# Patient Record
Sex: Female | Born: 1940
Health system: Southern US, Community
[De-identification: ages and names within clinical notes are randomized; demographics above are authoritative.]

## PROBLEM LIST (undated history)

## (undated) DIAGNOSIS — I639 Cerebral infarction, unspecified: Secondary | ICD-10-CM

## (undated) DIAGNOSIS — I2510 Atherosclerotic heart disease of native coronary artery without angina pectoris: Secondary | ICD-10-CM

## (undated) DIAGNOSIS — R42 Dizziness and giddiness: Secondary | ICD-10-CM

## (undated) HISTORY — PX: RHINOPLASTY: SUR1284

## (undated) HISTORY — PX: CARDIAC CATHETERIZATION: SHX172

---

## 2010-01-21 DIAGNOSIS — B029 Zoster without complications: Secondary | ICD-10-CM

## 2010-01-21 HISTORY — DX: Zoster without complications: B02.9

## 2010-08-13 ENCOUNTER — Encounter: Payer: Self-pay | Admitting: Emergency Medicine

## 2010-08-13 ENCOUNTER — Inpatient Hospital Stay (INDEPENDENT_AMBULATORY_CARE_PROVIDER_SITE_OTHER)
Admission: RE | Admit: 2010-08-13 | Discharge: 2010-08-13 | Disposition: A | Discharge status: Home / Self Care | Payer: Medicare Other | Source: Ambulatory Visit | Attending: Emergency Medicine | Admitting: Emergency Medicine

## 2010-08-13 DIAGNOSIS — B029 Zoster without complications: Secondary | ICD-10-CM

## 2010-08-13 HISTORY — DX: Zoster without complications: B02.9

## 2010-09-03 ENCOUNTER — Inpatient Hospital Stay (INDEPENDENT_AMBULATORY_CARE_PROVIDER_SITE_OTHER)
Admission: RE | Admit: 2010-09-03 | Discharge: 2010-09-03 | Disposition: A | Discharge status: Home / Self Care | Payer: Medicare Other | Source: Ambulatory Visit | Attending: Family Medicine | Admitting: Family Medicine

## 2010-09-03 ENCOUNTER — Encounter: Payer: Self-pay | Admitting: Family Medicine

## 2010-09-03 DIAGNOSIS — L03319 Cellulitis of trunk, unspecified: Secondary | ICD-10-CM | POA: Insufficient documentation

## 2010-09-03 DIAGNOSIS — L02219 Cutaneous abscess of trunk, unspecified: Secondary | ICD-10-CM | POA: Insufficient documentation

## 2010-09-03 DIAGNOSIS — B0229 Other postherpetic nervous system involvement: Secondary | ICD-10-CM

## 2010-09-05 ENCOUNTER — Telehealth (INDEPENDENT_AMBULATORY_CARE_PROVIDER_SITE_OTHER): Payer: Self-pay | Admitting: *Deleted

## 2010-12-24 NOTE — Progress Notes (Signed)
Summary: Severe Pain from Shingles (room 4)   Vital Signs:  Patient Profile:   70 Years Old Female CC:      severe pain from Shingles Height:     64 inches Weight:      192 pounds O2 Sat:      96 % O2 treatment:    Oxygen Temp:     98.3 degrees F oral Pulse rate:   93 / minute Resp:     16 per minute BP sitting:   162 / 92  (left arm) Cuff size:   large  Pt. in pain?   yes  Vitals Entered By: Lavell Islam RN (September 03, 2010 12:24 PM)                   Prior Medication List:  Joyce Copa ALLERGY 180 MG TABS (FEXOFENADINE HCL)  VICODIN 5-500 MG TABS (HYDROCODONE-ACETAMINOPHEN) 1 by mouth at bedtime as needed for severe pain VALTREX 1 GM TABS (VALACYCLOVIR HCL) 1 by mouth three times a day for 7 days PREDNISONE 10 MG TABS (PREDNISONE) 20mg  two times a day for 3 days, 10mg  two times a day for 3 days, 10mg  daily for 3 days, then stop   Updated Prior Medication List: ALLEGRA ALLERGY 180 MG TABS (FEXOFENADINE HCL)  VICODIN 5-500 MG TABS (HYDROCODONE-ACETAMINOPHEN) 1 by mouth at bedtime as needed for severe pain VALTREX 1 GM TABS (VALACYCLOVIR HCL) 1 by mouth three times a day for 7 days PREDNISONE 10 MG TABS (PREDNISONE) 20mg  two times a day for 3 days, 10mg  two times a day for 3 days, 10mg  daily for 3 days, then stop  Current Allergies (reviewed today): ! * MANGOSHistory of Present Illness Chief Complaint: severe pain from Shingles History of Present Illness: Patient reports having sever r hip pain . She reports having shingles about 3 weeks ago. The same pain initial for 3 days the the rash. She was placed on antiviral and was doing fine until Thursday when the pain started back.  The pain has been constant then and getting more intense. She reports taking the vicodin 1/2 tablet to a hole tablet at night trying to get rest unsuccessfully. Some of the ras under her abdomen has remained open as well.  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)   Current Meds ALLEGRA ALLERGY 180 MG TABS (FEXOFENADINE HCL)  VICODIN 5-500 MG TABS (HYDROCODONE-ACETAMINOPHEN) 1 by mouth at bedtime as needed for severe pain VALTREX 1 GM TABS (VALACYCLOVIR HCL) 1 by mouth three times a day for 7 days PREDNISONE 10 MG TABS (PREDNISONE) 20mg  two times a day for 3 days, 10mg  two times a day for 3 days, 10mg  daily for 3 days, then stop BACTROBAN 2 % OINT (MUPIROCIN) apply to open rash 3 x aday for up to 10 days NEURONTIN 300 MG CAPS (GABAPENTIN) 1 by mouth q at night for 3 days and Increase to twice ad ay after 3 nights PERCOCET 5-325 MG TABS (OXYCODONE-ACETAMINOPHEN) 1 by mouth q 6hrs as needed for pain LIDODERM 5 % PTCH (LIDOCAINE) trim and apply over lL lumbar spine  REVIEW OF SYSTEMS Constitutional Symptoms      Denies fever, chills, night sweats, weight loss, weight gain, and fatigue.  Eyes       Denies change in vision, eye pain, eye discharge, glasses, contact lenses, and eye surgery. Ear/Nose/Throat/Mouth       Denies hearing loss/aids, change in hearing, ear pain, ear discharge, dizziness, frequent runny nose, frequent  nose bleeds, sinus problems, sore throat, hoarseness, and tooth pain or bleeding.  Respiratory       Denies dry cough, productive cough, wheezing, shortness of breath, asthma, bronchitis, and emphysema/COPD.  Cardiovascular       Denies murmurs, chest pain, and tires easily with exhertion.    Gastrointestinal       Denies stomach pain, nausea/vomiting, diarrhea, constipation, blood in bowel movements, and indigestion. Genitourniary       Denies painful urination, kidney stones, and loss of urinary control. Neurological       Denies paralysis, seizures, and fainting/blackouts. Musculoskeletal       Denies muscle pain, joint pain, joint stiffness, decreased range of motion, redness, swelling, muscle weakness, and gout.  Skin       Denies bruising, unusual mles/lumps or sores, and  hair/skin or nail changes.  Psych       Denies mood changes, temper/anger issues, anxiety/stress, speech problems, depression, and sleep problems. Other Comments: Severe pain Shingles sites along left side body   Past History:  Family History: Last updated: 08/13/2010 mother- healthy age 9yo father0 deceased age 75 yo- lung CA and heart dz  Social History: Last updated: 08/13/2010 Never Smoked Alcohol use-no Drug use-no  Risk Factors: Smoking Status: never (08/13/2010)  Past Medical History: vertigo Dx Shingles 07/2310  Past Surgical History: Reviewed history from 08/13/2010 and no changes required. rhinoplasty - 1976  Family History: Reviewed history from 08/13/2010 and no changes required. mother- healthy age 88yo father0 deceased age 40 yo- lung CA and heart dz  Social History: Reviewed history from 08/13/2010 and no changes required. 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  Plan New Medications/Changes: PERCOCET 5-325 MG TABS (OXYCODONE-ACETAMINOPHEN) 1 by mouth q 6hrs as needed for pain  #20 x 0, 09/03/2010, Hassan Rowan MD NEURONTIN 300 MG CAPS (GABAPENTIN) 1 by mouth q at night for 3 days and Increase to twice ad ay after 3 nights  #30 x 0, 09/03/2010, Hassan Rowan MD BACTROBAN 2 % OINT (MUPIROCIN) apply to open rash 3 x aday for up to 10 days  #1 tube x 0, 09/03/2010, Hassan Rowan MD LIDODERM 5 % PTCH (LIDOCAINE) trim and apply over lL  lumbar spine  #1 pack x 2, 09/03/2010, Hassan Rowan MD PERCOCET 5-325 MG TABS (OXYCODONE-ACETAMINOPHEN) 1 by mouth q 6hrs as needed for pain  #20 x 0, 09/03/2010, Hassan Rowan MD NEURONTIN 300 MG CAPS (GABAPENTIN) 1 by mouth q at night for 3 days and Increase to twice ad ay after 3 nights  #30 x 0, 09/03/2010, Hassan Rowan MD BACTROBAN 2 % OINT (MUPIROCIN) apply to open rash 3 x aday for up to 10 days  #1 tube x 0, 09/03/2010, Hassan Rowan MD  New Orders: Est. Patient Level III [16109] Follow Up: Follow up on an as needed basis, Follow up with Primary Physician Follow Up: 7-10 days if not bettr  The patient and/or caregiver has been counseled thoroughly with regard to medications prescribed including dosage, schedule, interactions, rationale for use, and possible side effects and they verbalize understanding.  Diagnoses  and expected course of recovery discussed and will return if not improved as expected or if the condition worsens. Patient and/or caregiver verbalized understanding.  Prescriptions: PERCOCET 5-325 MG TABS (OXYCODONE-ACETAMINOPHEN) 1 by mouth q 6hrs as needed for pain  #20 x 0   Entered and Authorized by:   Hassan Rowan MD   Signed by:   Hassan Rowan MD on 09/03/2010   Method used:   Print then Give to Patient   RxID:   [id number] NEURONTIN 300 MG CAPS (GABAPENTIN) 1 by mouth q at night for 3 days and Increase to twice ad ay after 3 nights  #30 x 0   Entered and Authorized by:   Hassan Rowan MD   Signed by:   Hassan Rowan MD on 09/03/2010   Method used:   Print then Give to Patient   RxID:   [id number] BACTROBAN 2 % OINT (MUPIROCIN) apply to open rash 3 x aday for up to 10 days  #1 tube x 0   Entered and Authorized by:   Hassan Rowan MD   Signed by:   Hassan Rowan MD on 09/03/2010   Method used:   Print then Give to Patient   RxID:   [id number] LIDODERM 5 % PTCH (LIDOCAINE) trim and apply over lL lumbar spine  #1 pack x 2   Entered and Authorized by:   Hassan Rowan  MD   Signed by:   Hassan Rowan MD on 09/03/2010   Method used:   Print then Give to Patient   RxID:   [id number] PERCOCET 5-325 MG TABS (OXYCODONE-ACETAMINOPHEN) 1 by mouth q 6hrs as needed for pain  #20 x 0   Entered and Authorized by:   Hassan Rowan MD   Signed by:   Hassan Rowan MD on 09/03/2010   Method used:   Print then Give to Patient   RxID:   [id number] NEURONTIN 300 MG CAPS (GABAPENTIN) 1 by mouth q at night for 3 days and Increase to twice ad ay after 3 nights  #30 x 0   Entered and Authorized by:   Hassan Rowan MD   Signed by:   Hassan Rowan MD on 09/03/2010   Method used:   Print then Give to Patient   RxID:   [id number] BACTROBAN 2 % OINT (MUPIROCIN) apply to open rash 3 x aday for up to 10 days  #1 tube x 0   Entered and Authorized by:   Hassan Rowan MD   Signed by:   Hassan Rowan MD on 09/03/2010   Method used:   Print then Give to Patient   RxID:   [id number]   Patient Instructions: 1)  Please schedule a follow-up appointment as needed. 2)  Please schedule an appointment with your primary doctor in :7-86m days 3)  Try to estavblish a PCP if able.  Orders Added: 1)  Est. Patient Level III [31517]

## 2010-12-24 NOTE — Progress Notes (Signed)
Summary: BACK SPASMS rm 5   Vital Signs:  Patient Profile:   70 Years Old Female CC:      back spasms x 2days Height:     64 inches Weight:      193.25 pounds O2 Sat:      97 % O2 treatment:    Room Air Temp:     98.8 degrees F oral Pulse rate:   87 / minute Resp:     18 per minute BP sitting:   172 / 89  (left arm) Cuff size:   regular  Vitals Entered By: Clemens Catholic LPN (August 13, 2010 2:06 PM)                  Updated Prior Medication List: ALLEGRA ALLERGY 180 MG TABS (FEXOFENADINE HCL)   Current Allergies: ! * MANGOSHistory of Present Illness History from: patient Chief Complaint: back spasms x 2days History of Present Illness: The patient presents today with back pain for 2 days.  Feels like spasms.  She has baseline back pain ever since she was young.  Today she broke out in a rash around the same area.  Feels tight and burning and she is having trouble sleeping.  [place]. Trauma: no Bladder/bowel incontinence: no Weakness: no Fever/chills: no Night pain: no Unexplained weight loss: no Cancer/immunosuppression: no PMH of osteoporosis or chronic steroid use:  no  REVIEW OF SYSTEMS Constitutional Symptoms      Denies fever, chills, night sweats, weight loss, weight gain, and fatigue.  Eyes       Denies change in vision, eye pain, eye discharge, glasses, contact lenses, and eye surgery. Ear/Nose/Throat/Mouth       Denies hearing loss/aids, change in hearing, ear pain, ear discharge, dizziness, frequent runny nose, frequent nose bleeds, sinus problems, sore throat, hoarseness, and tooth pain or bleeding.  Respiratory       Denies dry cough, productive cough, wheezing, shortness of breath, asthma, bronchitis, and emphysema/COPD.  Cardiovascular       Denies murmurs, chest pain, and tires easily with exhertion.    Gastrointestinal       Denies stomach pain, nausea/vomiting, diarrhea, constipation, blood in bowel movements, and  indigestion. Genitourniary       Denies painful urination, kidney stones, and loss of urinary control. Neurological       Denies paralysis, seizures, and fainting/blackouts. Musculoskeletal       Complains of muscle pain and joint pain.      Denies joint stiffness, decreased range of motion, redness, swelling, muscle weakness, and gout.  Skin       Denies bruising, unusual mles/lumps or sores, and hair/skin or nail changes.  Psych       Denies mood changes, temper/anger issues, anxiety/stress, speech problems, depression, and sleep problems. Other Comments: pt c/o low/mid back spasms radiating to her LT leg x 2 days. she noticed a rash on her LT hip/back area x today. no fever.   Past History:  Past Medical History: Unremarkable  Past Surgical History: rhinoplasty - 61  Family History: mother- healthy age 56yo father0 deceased age 6 yo- lung CA and heart dz  Social History: Never Smoked Alcohol use-no Drug use-no Smoking Status:  never Drug Use:  no Physical Exam General appearance: well developed, well nourished, no acute distress MSE: oriented to time, place, and person Mild Vesicular rash L1 dermatome Left side.  Tender to the touch.  No signs of infection/cellulitis. Assessment New Problems: HERPES ZOSTER (ICD-053.9)  Patient Education: Patient and/or caregiver instructed in the following: rest, fluids.  Plan New Medications/Changes: PREDNISONE 10 MG TABS (PREDNISONE) 20mg  two times a day for 3 days, 10mg  two times a day for 3 days, 10mg  daily for 3 days, then stop  #QS x 0, 08/13/2010, Hoyt Koch MD VALTREX 1 GM TABS (VALACYCLOVIR HCL) 1 by mouth three times a day for 7 days  #21 x 0, 08/13/2010, Hoyt Koch MD VICODIN 5-500 MG TABS (HYDROCODONE-ACETAMINOPHEN) 1 by mouth at bedtime as needed for severe pain  #10 x 0, 08/13/2010, Hoyt Koch MD  New Orders: New Patient Level III [phone] Planning Comments:   Rx with Prednisone taper,  Valtrex and Lortab for nighttime pain.  Avoid pregnant women and babies.  Keep area clean.  Follow-up with your primary care physician if not improving or if getting worse   The patient and/or caregiver has been counseled thoroughly with regard to medications prescribed including dosage, schedule, interactions, rationale for use, and possible side effects and they verbalize understanding.  Diagnoses and expected course of recovery discussed and will return if not improved as expected or if the condition worsens. Patient and/or caregiver verbalized understanding.  Prescriptions: PREDNISONE 10 MG TABS (PREDNISONE) 20mg  two times a day for 3 days, 10mg  two times a day for 3 days, 10mg  daily for 3 days, then stop  #QS x 0   Entered and Authorized by:   Hoyt Koch MD   Signed by:   Hoyt Koch MD on 08/13/2010   Method used:   Print then Give to Patient   RxID:   [id number] VALTREX 1 GM TABS (VALACYCLOVIR HCL) 1 by mouth three times a day for 7 days  #21 x 0   Entered and Authorized by:   Hoyt Koch MD   Signed by:   Hoyt Koch MD on 08/13/2010   Method used:   Print then Give to Patient   RxID:   [id number] VICODIN 5-500 MG TABS (HYDROCODONE-ACETAMINOPHEN) 1 by mouth at bedtime as needed for severe pain  #10 x 0   Entered and Authorized by:   Hoyt Koch MD   Signed by:   Hoyt Koch MD on 08/13/2010   Method used:   Print then Give to Patient   RxID:   [id number]   Orders Added: 1)  New Patient Level III [25366]

## 2010-12-24 NOTE — Telephone Encounter (Signed)
  Phone Note Outgoing Call Call back at [facility] Phone [phone]   Call placed by: Lajean Saver RN,  September 05, 2010 12:09 PM Call placed to: Patient Action Taken: Phone Call Completed Summary of Call: Callback: patient reports some improvement in her pain. Will call with further questions

## 2012-10-09 ENCOUNTER — Encounter (HOSPITAL_BASED_OUTPATIENT_CLINIC_OR_DEPARTMENT_OTHER): Payer: Self-pay | Admitting: *Deleted

## 2012-10-09 ENCOUNTER — Emergency Department (HOSPITAL_BASED_OUTPATIENT_CLINIC_OR_DEPARTMENT_OTHER)
Admission: EM | Admit: 2012-10-09 | Discharge: 2012-10-10 | Disposition: A | Discharge status: Home / Self Care | Payer: Medicare Other | Attending: Emergency Medicine | Admitting: Emergency Medicine

## 2012-10-09 ENCOUNTER — Emergency Department (HOSPITAL_BASED_OUTPATIENT_CLINIC_OR_DEPARTMENT_OTHER): Payer: Medicare Other

## 2012-10-09 DIAGNOSIS — M7989 Other specified soft tissue disorders: Secondary | ICD-10-CM

## 2012-10-09 NOTE — ED Notes (Signed)
Patient transported to Ultrasound 

## 2012-10-09 NOTE — ED Notes (Signed)
Pt c/o right leg swelling x 1 day denies pain

## 2012-10-10 ENCOUNTER — Encounter (HOSPITAL_BASED_OUTPATIENT_CLINIC_OR_DEPARTMENT_OTHER): Payer: Self-pay | Admitting: Emergency Medicine

## 2012-10-10 NOTE — ED Provider Notes (Signed)
CSN: [id number]     Arrival date & time 10/09/12  2340 History   First MD Initiated Contact with Patient 10/10/12 0008     Chief Complaint  Patient presents with  . Leg Swelling   (Consider location/radiation/quality/duration/timing/severity/associated sxs/prior Treatment) The history is provided by the patient. No language interpreter was used.  RLE swelling this evening.  No trauma.  No pain.  No weakness. No travel.  No f/c/r.  No rashes. No SP no sob  History reviewed. No pertinent past medical history. History reviewed. No pertinent past surgical history. History reviewed. No pertinent family history. History  Substance Use Topics  . Smoking status: Never Smoker   . Smokeless tobacco: Not on file  . Alcohol Use: No   OB History   Grav Para Term Preterm Abortions TAB SAB Ect Mult Living                 Review of Systems  Constitutional: Negative for fever.  HENT: Negative for neck pain.   Respiratory: Negative for shortness of breath and wheezing.   Cardiovascular: Negative for chest pain.  Skin: Negative for rash.  All other systems reviewed and are negative.    Allergies  Review of patient's allergies indicates no known allergies.  Home Medications  No current outpatient prescriptions on file. BP 185/79  Pulse 94  Temp(Src) 98.1 F (36.7 C) (Oral)  Resp 16  Ht 5\' 4"  (1.626 m)  Wt 190 lb (86.183 kg)  BMI 32.6 kg/m2  SpO2 97% Physical Exam  Constitutional: She is oriented to person, place, and time. She appears well-developed and well-nourished. No distress.  HENT:  Head: Normocephalic and atraumatic.  Mouth/Throat: Oropharynx is clear and moist.  Eyes: Conjunctivae are normal. Pupils are equal, round, and reactive to light.  Neck: Normal range of motion. Neck supple.  Cardiovascular: Normal rate, regular rhythm and intact distal pulses.   Pulmonary/Chest: Effort normal and breath sounds normal. She has no wheezes. She has no rales.  Abdominal: Soft.  Bowel sounds are normal. There is no tenderness. There is no rebound and no guarding.  Musculoskeletal: Normal range of motion. She exhibits no edema and no tenderness.  Neurological: She is alert and oriented to person, place, and time. She has normal reflexes.  Skin: Skin is warm and dry.  Psychiatric: She has a normal mood and affect.    ED Course  Procedures (including critical care time) Labs Review Labs Reviewed - No data to display Imaging Review US Venous Img Lower Unilateral Right  10/10/2012   CLINICAL DATA:  Leg swelling.  EXAM: RIGHT LOWER EXTREMITY VENOUS DOPPLER ULTRASOUND  TECHNIQUE: Gray-scale sonography with compression, as well as color and duplex ultrasound, were performed to evaluate the deep venous system from the level of the common femoral vein through the popliteal and proximal calf veins.  COMPARISON:  None  FINDINGS: Normal compressibility of the common femoral, superficial femoral, and popliteal veins, as well as the proximal calf veins. No filling defects to suggest DVT on grayscale or color Doppler imaging. Doppler waveforms show normal direction of venous flow, normal respiratory phasicity and response to augmentation. The visualized portions of greater saphenous vein unremarkable.  IMPRESSION: No evidence of  lower extremity deep vein thrombosis.   Electronically Signed   By: Oley Balm M.D.   On: 10/10/2012 00:26    MDM  No diagnosis found. Follow up with your PMD ice and elevate the leg   Jacori Mulrooney K Cleotha Tsang-Rasch, MD 10/10/12 [phone]

## 2012-11-26 ENCOUNTER — Other Ambulatory Visit: Payer: Self-pay

## 2019-09-23 ENCOUNTER — Other Ambulatory Visit: Payer: Self-pay

## 2019-09-23 ENCOUNTER — Emergency Department (HOSPITAL_BASED_OUTPATIENT_CLINIC_OR_DEPARTMENT_OTHER): Payer: Medicare Other

## 2019-09-23 ENCOUNTER — Observation Stay (HOSPITAL_BASED_OUTPATIENT_CLINIC_OR_DEPARTMENT_OTHER)
Admission: EM | Admit: 2019-09-23 | Discharge: 2019-09-25 | Disposition: A | Discharge status: Home / Self Care | Payer: Medicare Other | Attending: Internal Medicine | Admitting: Internal Medicine

## 2019-09-23 ENCOUNTER — Encounter (HOSPITAL_BASED_OUTPATIENT_CLINIC_OR_DEPARTMENT_OTHER): Payer: Self-pay

## 2019-09-23 DIAGNOSIS — G459 Transient cerebral ischemic attack, unspecified: Secondary | ICD-10-CM

## 2019-09-23 DIAGNOSIS — Z20822 Contact with and (suspected) exposure to covid-19: Secondary | ICD-10-CM | POA: Diagnosis not present

## 2019-09-23 DIAGNOSIS — R42 Dizziness and giddiness: Principal | ICD-10-CM | POA: Insufficient documentation

## 2019-09-23 DIAGNOSIS — R531 Weakness: Secondary | ICD-10-CM | POA: Insufficient documentation

## 2019-09-23 DIAGNOSIS — I639 Cerebral infarction, unspecified: Secondary | ICD-10-CM | POA: Insufficient documentation

## 2019-09-23 DIAGNOSIS — R29898 Other symptoms and signs involving the musculoskeletal system: Secondary | ICD-10-CM

## 2019-09-23 DIAGNOSIS — I63342 Cerebral infarction due to thrombosis of left cerebellar artery: Secondary | ICD-10-CM

## 2019-09-23 HISTORY — DX: Transient cerebral ischemic attack, unspecified: G45.9

## 2019-09-23 HISTORY — DX: Dizziness and giddiness: R42

## 2019-09-23 LAB — BASIC METABOLIC PANEL
Anion gap: 10 (ref 5–15)
BUN: 14 mg/dL (ref 8–23)
CO2: 23 mmol/L (ref 22–32)
Calcium: 9.3 mg/dL (ref 8.9–10.3)
Chloride: 103 mmol/L (ref 98–111)
Creatinine, Ser: 0.65 mg/dL (ref 0.44–1.00)
GFR calc Af Amer: >60 mL/min (ref >60)
GFR calc non Af Amer: >60 mL/min (ref >60)
Glucose, Bld: 109 mg/dL — ABNORMAL HIGH (ref 70–99)
Potassium: 4 mmol/L (ref 3.5–5.1)
Sodium: 136 mmol/L (ref 135–145)

## 2019-09-23 LAB — URINALYSIS, ROUTINE W REFLEX MICROSCOPIC
Bilirubin Urine: NEGATIVE
Glucose, UA: NEGATIVE mg/dL
Hgb urine dipstick: NEGATIVE
Ketones, ur: 15 mg/dL — AB
Leukocytes,Ua: NEGATIVE
Nitrite: NEGATIVE
Protein, ur: NEGATIVE mg/dL
Specific Gravity, Urine: >1.03 — ABNORMAL HIGH (ref 1.005–1.030)
pH: 5.5 (ref 5.0–8.0)

## 2019-09-23 LAB — CBC
HCT: 43.4 % (ref 36.0–46.0)
Hemoglobin: 14.6 g/dL (ref 12.0–15.0)
MCH: 29.6 pg (ref 26.0–34.0)
MCHC: 33.6 g/dL (ref 30.0–36.0)
MCV: 87.9 fL (ref 80.0–100.0)
Platelets: 267 10*3/uL (ref 150–400)
RBC: 4.94 MIL/uL (ref 3.87–5.11)
RDW: 13.9 % (ref 11.5–15.5)
WBC: 8.9 10*3/uL (ref 4.0–10.5)
nRBC: 0 % (ref 0.0–0.2)

## 2019-09-23 LAB — TROPONIN I (HIGH SENSITIVITY): Troponin I (High Sensitivity): 6 ng/L (ref <18)

## 2019-09-23 MED ORDER — DIPHENHYDRAMINE HCL 50 MG/ML IJ SOLN: 50.0000 mg | Freq: Once | INTRAMUSCULAR | Status: AC

## 2019-09-23 MED ORDER — DIPHENHYDRAMINE HCL 25 MG PO CAPS
50.0000 mg | ORAL_CAPSULE | Freq: Once | ORAL | Status: AC
  Administered 2019-09-24: 50 mg via ORAL
  Filled 2019-09-23: qty 2

## 2019-09-23 MED ORDER — HYDROCORTISONE NA SUCCINATE PF 250 MG IJ SOLR
200.0000 mg | Freq: Once | INTRAMUSCULAR | Status: DC
  Filled 2019-09-23: qty 200

## 2019-09-23 MED ORDER — ASPIRIN 81 MG PO CHEW
81.0000 mg | CHEWABLE_TABLET | Freq: Once | ORAL | Status: AC
  Administered 2019-09-23: 22:00:00 81 mg via ORAL
  Filled 2019-09-23: qty 1

## 2019-09-23 MED ORDER — ASPIRIN 325 MG PO TABS
325.0000 mg | ORAL_TABLET | Freq: Every day | ORAL | Status: DC
  Administered 2019-09-24: 325 mg via ORAL
  Filled 2019-09-23: qty 1

## 2019-09-23 MED ORDER — METHYLPREDNISOLONE SODIUM SUCC 40 MG IJ SOLR
40.0000 mg | Freq: Once | INTRAMUSCULAR | Status: AC
  Administered 2019-09-23: 23:00:00 40 mg via INTRAVENOUS
  Filled 2019-09-23: qty 1

## 2019-09-23 MED ORDER — CLOPIDOGREL BISULFATE 300 MG PO TABS
300.0000 mg | ORAL_TABLET | Freq: Once | ORAL | Status: AC
  Administered 2019-09-23: 22:00:00 300 mg via ORAL
  Filled 2019-09-23: qty 1

## 2019-09-23 MED ORDER — ATORVASTATIN CALCIUM 40 MG PO TABS
40.0000 mg | ORAL_TABLET | Freq: Every day | ORAL | Status: DC
  Administered 2019-09-24: 40 mg via ORAL
  Filled 2019-09-23: qty 1

## 2019-09-23 MED ORDER — CLOPIDOGREL BISULFATE 75 MG PO TABS
75.0000 mg | ORAL_TABLET | Freq: Every day | ORAL | Status: DC
  Administered 2019-09-24 – 2019-09-25 (×2): 75 mg via ORAL
  Filled 2019-09-23 (×2): qty 1

## 2019-09-23 NOTE — ED Notes (Signed)
Pt. Now reports to RN she had an allergic reaction to the CT dye many years ago when she had a CT on her kidneys.

## 2019-09-23 NOTE — ED Notes (Signed)
Pt. Reports she felt like she had no control of her R hand last night and the same happened to her on Sat.  Pt. In no distress at this time and is using her R hand with no difficulty.  Pt. Reports no issues today.  Pt. Having no dizziness today and no R arm or R hand pain at this time or difficulty using her R hand today.

## 2019-09-23 NOTE — ED Provider Notes (Signed)
[place] [facility] EMERGENCY DEPARTMENT Provider Note   CSN: [id number] Arrival date & time: 09/23/19  1349     History Chief Complaint  Patient presents with  . Dizziness    Jamie Hodge is a 79 y.o. female who reports no significant past medical history presented emergency department with 2 episodes of vertigo and right arm weakness.  Patient reports the first episode occurred on Saturday, 6 days ago, while at rest.  She began to feel lightheaded also felt like her right arm was weak core that she could not control it.  This lasted a few minutes until the paramedics came out.  They had advised she go to the hospital but she refused at that time.  She had had complete resolution of her symptoms.  She then felt fine throughout the course of the week till yesterday evening.  While sitting on her couch at rest watching television, she again had an episode of lightheadedness and possible vertigo, associated with numbness and weakness in her right arm.  This lasted less than a minute and completely resolved.  She decided to come to emergency department today to get evaluated for this.  She denies any history of stroke or TIA in the past.  She denies any known history of hypertension, high cholesterol, but states she does not normally go to the doctors, and does not take any medications.  She has no family history of stroke.  She has no history of cardiac disease or arrhythmia.  She does not smoke cigarettes.  Currently in the emergency department she is completely asymptomatic.  She denies headache, blurred vision, nausea or vomiting.  She denies any numbness or weakness of the extremities.  She denies any ongoing vertigo.  HPI     History reviewed. No pertinent past medical history.  Patient Active Problem List   Diagnosis Date Noted  . TIA (transient ischemic attack) 09/23/2019  . 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 CELLULITIS, ABDOMEN 09/03/2010  . HERPES ZOSTER 08/13/2010     History reviewed. No pertinent surgical history.   OB History   No obstetric history on file.     No family history on file.  Social History   Tobacco Use  . Smoking status: Never Smoker  . Smokeless tobacco: Never Used  Substance Use Topics  . Alcohol use: No  . Drug use: No    Home Medications Prior to Admission medications   Not on File    Allergies    Patient has no known allergies.  Review of Systems   Review of Systems  Constitutional: Negative for chills and fever.  HENT: Negative for ear pain and sore throat.   Eyes: Negative for pain and visual disturbance.  Respiratory: Negative for cough and shortness of breath.   Cardiovascular: Negative for chest pain and palpitations.  Gastrointestinal: Negative for abdominal pain, nausea and vomiting.  Genitourinary: Negative for dysuria and hematuria.  Musculoskeletal: Negative for arthralgias and myalgias.  Skin: Negative for color change and rash.  Neurological: Positive for dizziness, weakness, light-headedness and numbness. Negative for seizures, syncope, facial asymmetry and headaches.  Psychiatric/Behavioral: Negative for agitation and confusion.  All other systems reviewed and are negative.   Physical Exam Updated Vital Signs BP (!) 180/85   Pulse 84   Temp 99.3 F (37.4 C) (Oral)   Resp 16   Ht 5\' 4"  (1.626 m)   Wt 83.9 kg   SpO2 96%   BMI 31.76 kg/m   Physical Exam Vitals and nursing note  reviewed.  Constitutional:      General: She is not in acute distress.    Appearance: She is well-developed.  HENT:     Head: Normocephalic and atraumatic.  Eyes:     Conjunctiva/sclera: Conjunctivae normal.  Cardiovascular:     Rate and Rhythm: Normal rate and regular rhythm.     Pulses: Normal pulses.  Pulmonary:     Effort: Pulmonary effort is normal. No respiratory distress.  Abdominal:     Palpations: Abdomen is soft.     Tenderness: There is no abdominal tenderness.  Musculoskeletal:      Cervical back: Neck supple.  Skin:    General: Skin is warm and dry.  Neurological:     General: No focal deficit present.     Mental Status: She is alert and oriented to person, place, and time. Mental status is at baseline.     Cranial Nerves: No cranial nerve deficit.     Sensory: No sensory deficit.     Motor: No weakness.     Coordination: Coordination normal.  Psychiatric:        Mood and Affect: Mood normal.        Behavior: Behavior normal.     ED Results / Procedures / Treatments   Labs (all labs ordered are listed, but only abnormal results are displayed) Labs Reviewed  BASIC METABOLIC PANEL - Abnormal; Notable for the following components:      Result Value   Glucose, Bld 109 (*)    All other components within normal limits  URINALYSIS, ROUTINE W REFLEX MICROSCOPIC - Abnormal; Notable for the following components:   Specific Gravity, Urine >1.030 (*)    Ketones, ur 15 (*)    All other components within normal limits  SARS CORONAVIRUS 2 BY RT PCR (HOSPITAL ORDER, [facility])  CBC  TROPONIN I (HIGH SENSITIVITY)    EKG EKG Interpretation  Date/Time:  Thursday September 23 2019 14:27:29 EDT Ventricular Rate:  90 PR Interval:  166 QRS Duration: 62 QT Interval:  338 QTC Calculation: 413 R Axis:   -5 Text Interpretation: Normal sinus rhythm Low voltage QRS Cannot rule out Anterior infarct , age undetermined Abnormal ECG Confirmed by Sherwood Gambler [phone]) on 09/23/2019 2:49:13 PM   Radiology CT Head Wo Contrast  Result Date: 09/23/2019 CLINICAL DATA:  Transient ischemic attack, vertigo, right arm weakness EXAM: CT HEAD WITHOUT CONTRAST TECHNIQUE: Contiguous axial images were obtained from the base of the skull through the vertex without intravenous contrast. COMPARISON:  None. FINDINGS: Brain: Normal anatomic configuration. No abnormal intra or extra-axial mass lesion or fluid collection. No abnormal mass effect or midline shift. No  evidence of acute intracranial hemorrhage or infarct. Ventricular size is normal. Cerebellum unremarkable. Vascular: Unremarkable Skull: Intact Sinuses/Orbits: Paranasal sinuses are clear. Orbits are unremarkable. Other: Mastoid air cells and middle ear cavities are clear. IMPRESSION: No evidence of acute intracranial abnormality. Electronically Signed   By: Fidela Salisbury MD   On: 09/23/2019 20:35    Procedures Procedures (including critical care time)  Medications Ordered in ED Medications  diphenhydrAMINE (BENADRYL) capsule 50 mg (has no administration in time range)    Or  diphenhydrAMINE (BENADRYL) injection 50 mg (has no administration in time range)  atorvastatin (LIPITOR) tablet 40 mg (has no administration in time range)  clopidogrel (PLAVIX) tablet 75 mg (has no administration in time range)  aspirin tablet 325 mg (has no administration in time range)  aspirin chewable tablet 81 mg (81  mg Oral Given 09/23/19 2143)  clopidogrel (PLAVIX) tablet 300 mg (300 mg Oral Given 09/23/19 2143)  methylPREDNISolone sodium succinate (SOLU-MEDROL) 40 mg/mL injection 40 mg (40 mg Intravenous Given 09/23/19 2254)    ED Course  I have reviewed the triage vital signs and the nursing notes.  Pertinent labs & imaging results that were available during my care of the patient were reviewed by me and considered in my medical decision making (see chart for details).  79 yo female presenting to ED with two transient episodes of lightheadedness/vertigo associated with right arm weakness.  DDx includes TIA vs CVA vs hypogylcemia vs dehydration vs atypical ACS vs other  - CTH obtained without evidence of CVA - Trop normal, ECG unremarkable >12 hours since last episode - doubt ACS or aortic dissection - Covid negative - Electrolytes wnl - Hgb normal - Glucose wnl.  Not on insulin.  I consulted neurology by phone who recommended admission for TIA/stroke evaluation, including echocardiogram, CTA or dopplers  of the carotids, and MRI brain.  Advised plavix load at 300 mg, aspirin 81 mg.  Then can resume aspirin 81 mg and plavix 75 mg daily tomorrow.  ABCD2 score of 4  Patient agreeable to remaining for workup  Clinical Course as of Sep 24 43  Thu Sep 23, 2019  2246 Admitted to hospitalist.  After discussion with neurologist DR Leonel Ramsay, I agree this presentation is concerning for TIA.  I discussed further workup with the patient and her husband.  We will proceed to CTA while she is here in our ED, as there are no available beds at our hospital for rapid MRI.  She will need admission and eventual transfer for echocardiogram and MRI brain.  Patient is okay with this.  She reports hx of "allergies to dye" during a kidney scan ~50 years ago.  She will need premedication for her CTA here.  Benadryl and steroids ordered for prep   [MT]    Clinical Course User Index [MT] Langston Summerfield, Carola Rhine, MD    Final Clinical Impression(s) / ED Diagnoses Final diagnoses:  Lightheadedness  Right arm weakness    Rx / DC Orders ED Discharge Orders    None       Sinda Leedom, Carola Rhine, MD 09/24/19 [phone]

## 2019-09-23 NOTE — ED Triage Notes (Signed)
Pt reports intermittent dizziness over the past few weeks with last episode being last night reports history of vertigo. Denies being on medication for vertigo, pt reports last "vertigo attack" was about 10 years ago.

## 2019-09-24 ENCOUNTER — Observation Stay (HOSPITAL_COMMUNITY): Payer: Medicare Other

## 2019-09-24 DIAGNOSIS — Z20822 Contact with and (suspected) exposure to covid-19: Secondary | ICD-10-CM | POA: Diagnosis not present

## 2019-09-24 DIAGNOSIS — G459 Transient cerebral ischemic attack, unspecified: Secondary | ICD-10-CM

## 2019-09-24 DIAGNOSIS — R531 Weakness: Secondary | ICD-10-CM | POA: Diagnosis not present

## 2019-09-24 DIAGNOSIS — E669 Obesity, unspecified: Secondary | ICD-10-CM

## 2019-09-24 DIAGNOSIS — R42 Dizziness and giddiness: Secondary | ICD-10-CM | POA: Diagnosis present

## 2019-09-24 DIAGNOSIS — I639 Cerebral infarction, unspecified: Secondary | ICD-10-CM | POA: Diagnosis not present

## 2019-09-24 LAB — SARS CORONAVIRUS 2 BY RT PCR (HOSPITAL ORDER, PERFORMED IN ~~LOC~~ HOSPITAL LAB): SARS Coronavirus 2: NEGATIVE

## 2019-09-24 MED ORDER — ACETAMINOPHEN 325 MG PO TABS: 650.0000 mg | ORAL_TABLET | ORAL | Status: DC | PRN

## 2019-09-24 MED ORDER — IOHEXOL 350 MG/ML SOLN
100.0000 mL | Freq: Once | INTRAVENOUS | Status: AC | PRN
  Administered 2019-09-24: 75 mL via INTRAVENOUS

## 2019-09-24 MED ORDER — SENNOSIDES-DOCUSATE SODIUM 8.6-50 MG PO TABS: 1.0000 | ORAL_TABLET | Freq: Every evening | ORAL | Status: DC | PRN

## 2019-09-24 MED ORDER — STROKE: EARLY STAGES OF RECOVERY BOOK
Freq: Once | Status: AC
  Filled 2019-09-24: qty 1

## 2019-09-24 MED ORDER — ACETAMINOPHEN 650 MG RE SUPP: 650.0000 mg | RECTAL | Status: DC | PRN

## 2019-09-24 MED ORDER — ENOXAPARIN SODIUM 40 MG/0.4ML ~~LOC~~ SOLN
40.0000 mg | SUBCUTANEOUS | Status: DC
  Administered 2019-09-24 – 2019-09-25 (×2): 40 mg via SUBCUTANEOUS
  Filled 2019-09-24 (×2): qty 0.4

## 2019-09-24 MED ORDER — ACETAMINOPHEN 160 MG/5ML PO SOLN: 650.0000 mg | ORAL | Status: DC | PRN

## 2019-09-24 NOTE — H&P (Signed)
History and Physical  Jamie Hodge XNA:[id number] DOB: 1940/07/19 DOA: 09/23/2019   Patient coming from: Home & is able to ambulate   Chief Complaint: Dizziness  HPI: Jamie Hodge is a 79 y.o. female with no significant medical history presented to [facility] complaining of dizziness, intermittent episodes of vertigo and right arm weakness (dissociation as patient described).  Patient reported first episode occurred at about 6 days ago while at rest, noted some dizziness, felt like her right arm was weak, lasted for a few minutes and resolved.  EMS was called, but patient refused to go to the hospital at that time.  Patient reported not experiencing similar symptoms until on 09/22/2019 while watching TV, had an episode of vertigo, with associated numbness and weakness in her right arm, lasted for a few minutes and completely resolved.  Patient decided to come to the ER on 09/23/2019 for further evaluation.  Patient denies any past medical history, denies any history of TIAs or strokes in the past, patient does not smoke, drink alcohol or use any illicit drug.  Due to unavailability of MRI at [facility], patient was transferred to [facility] for further TIA work-up including MRI.  EDP spoke to neurology Dr. Leonel Ramsay, who recommended transfer for further evaluation.    ED Course: In the ED, continue to remain asymptomatic, vital signs stable except for uncontrolled BP.  Labs fairly unremarkable.  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  Patient admitted for further management  Review of Systems: Review of systems are otherwise negative   History reviewed. No pertinent past medical history. History reviewed. No pertinent surgical history.  Social History:  reports that she has never  smoked. She has never used smokeless tobacco. She reports that she does not drink alcohol and does not use drugs.   Allergies  Allergen Reactions  . Contrast Media [Iodinated Diagnostic Agents]     Reports decline in kidney function "years ago"    No family history on file.    Prior to Admission medications   Not on File    Physical Exam: BP (!) 165/139   Pulse 96   Temp 97.8 F (36.6 C) (Oral)   Resp 20   Ht 5\' 4"  (1.626 m)   Wt 83.9 kg   SpO2 96%   BMI 31.76 kg/m   General: NAD Eyes: Normal ENT: Normal Neck: Supple Cardiovascular: S1, S2 present Respiratory: CTA B Abdomen: Soft, nontender, nondistended, bowel sounds present Skin: Normal, no rash noted Musculoskeletal: No pedal edema noted bilaterally Psychiatric: Normal mood Neurologic: No obvious focal neurologic deficit, strength equal in all extremities, sensation intact          Labs on Admission:  Basic Metabolic Panel: Recent Labs  Lab 09/23/19 1420  NA 136  K 4.0  CL 103  CO2 23  GLUCOSE 109*  BUN 14  CREATININE 0.65  CALCIUM 9.3   Liver Function Tests: No results for input(s): AST, ALT, ALKPHOS, BILITOT, PROT, ALBUMIN in the last 168 hours. No results for input(s): LIPASE, AMYLASE in the last 168 hours. No results for input(s): AMMONIA in the last 168 hours. CBC: Recent Labs  Lab 09/23/19 1420  WBC 8.9  HGB 14.6  HCT 43.4  MCV 87.9  PLT 267   Cardiac Enzymes: No results for input(s): CKTOTAL, CKMB,  CKMBINDEX, TROPONINI in the last 168 hours.  BNP (last 3 results) No results for input(s): BNP in the last 8760 hours.  ProBNP (last 3 results) No results for input(s): PROBNP in the last 8760 hours.  CBG: No results for input(s): GLUCAP in the last 168 hours.  Radiological Exams on Admission: CT Angio Head W or Wo Contrast  Result Date: 09/24/2019 CLINICAL DATA:  Dizziness with history of vertigo. EXAM: CT ANGIOGRAPHY HEAD AND NECK TECHNIQUE: Multidetector CT imaging of the head  and neck was performed using the standard protocol during bolus administration of intravenous contrast. Multiplanar CT image reconstructions and MIPs were obtained to evaluate the vascular anatomy. Carotid stenosis measurements (when applicable) are obtained utilizing NASCET criteria, using the distal internal carotid diameter as the denominator. CONTRAST:  21mL OMNIPAQUE IOHEXOL 350 MG/ML SOLN COMPARISON:  None. FINDINGS: CT HEAD FINDINGS Brain: There is no mass, hemorrhage or extra-axial collection. The size and configuration of the ventricles and extra-axial CSF spaces are normal. There is no acute or chronic infarction. The brain parenchyma is normal. Skull: The visualized skull base, calvarium and extracranial soft tissues are normal. Sinuses/Orbits: No fluid levels or advanced mucosal thickening of the visualized paranasal sinuses. No mastoid or middle ear effusion. The orbits are normal. CTA NECK FINDINGS SKELETON: There is no bony spinal canal stenosis. No lytic or blastic lesion. OTHER NECK: Normal pharynx, larynx and major salivary glands. No cervical lymphadenopathy. Unremarkable thyroid gland. UPPER CHEST: No pneumothorax or pleural effusion. No nodules or masses. AORTIC ARCH: There is mild calcific atherosclerosis of the aortic arch. There is no aneurysm, dissection or hemodynamically significant stenosis of the visualized portion of the aorta. Conventional 3 vessel aortic branching pattern. The visualized proximal subclavian arteries are widely patent. RIGHT CAROTID SYSTEM: No dissection, occlusion or aneurysm. Mild atherosclerotic calcification at the carotid bifurcation without hemodynamically significant stenosis. LEFT CAROTID SYSTEM: Normal without aneurysm, dissection or stenosis. VERTEBRAL ARTERIES: Left dominant configuration. Both origins are clearly patent. There is no dissection, occlusion or flow-limiting stenosis to the skull base (V1-V3 segments). CTA HEAD FINDINGS POSTERIOR CIRCULATION:  --Vertebral arteries: Normal V4 segments. --Inferior cerebellar arteries: Normal. --Basilar artery: Normal. --Superior cerebellar arteries: Normal. --Posterior cerebral arteries (PCA): Normal. 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 Moderate stenosis of the left clinoid segment. --Anterior cerebral arteries (ACA): Normal. Both A1 segments are present. Patent anterior communicating artery (a-comm). --Middle cerebral arteries (MCA): Normal. VENOUS SINUSES: As permitted by contrast timing, patent. ANATOMIC VARIANTS: None Review of the MIP images confirms the above findings. IMPRESSION: 1. No emergent large vessel occlusion. 2. 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 Aortic Atherosclerosis (ICD10-I70.0). Electronically Signed   By: Ulyses Jarred M.D.   On: 09/24/2019 03:00   CT Head Wo Contrast  Result Date: 09/23/2019 CLINICAL DATA:  Transient ischemic attack, vertigo, right arm weakness EXAM: CT HEAD WITHOUT CONTRAST TECHNIQUE: Contiguous axial images were obtained from the base of the skull through the vertex without intravenous contrast. COMPARISON:  None. FINDINGS: Brain: Normal anatomic configuration. No abnormal intra or extra-axial mass lesion or fluid collection. No abnormal mass effect or midline shift. No evidence of acute intracranial hemorrhage or infarct. Ventricular size is normal. Cerebellum unremarkable. Vascular: Unremarkable Skull: Intact Sinuses/Orbits: Paranasal sinuses are clear. Orbits are unremarkable. Other: Mastoid air cells and middle ear cavities are clear. IMPRESSION: No evidence of  acute intracranial abnormality. Electronically Signed   By: Fidela Salisbury MD   On: 09/23/2019 20:35   CT Angio Neck W and/or Wo Contrast  Result Date: 09/24/2019 CLINICAL DATA:  Dizziness with history of  vertigo. EXAM: CT ANGIOGRAPHY HEAD AND NECK TECHNIQUE: Multidetector CT imaging of the head and neck was performed using the standard protocol during bolus administration of intravenous contrast. Multiplanar CT image reconstructions and MIPs were obtained to evaluate the vascular anatomy. Carotid stenosis measurements (when applicable) are obtained utilizing NASCET criteria, using the distal internal carotid diameter as the denominator. CONTRAST:  38mL OMNIPAQUE IOHEXOL 350 MG/ML SOLN COMPARISON:  None. FINDINGS: CT HEAD FINDINGS Brain: There is no mass, hemorrhage or extra-axial collection. The size and configuration of the ventricles and extra-axial CSF spaces are normal. There is no acute or chronic infarction. The brain parenchyma is normal. Skull: The visualized skull base, calvarium and extracranial soft tissues are normal. Sinuses/Orbits: No fluid levels or advanced mucosal thickening of the visualized paranasal sinuses. No mastoid or middle ear effusion. The orbits are normal. CTA NECK FINDINGS SKELETON: There is no bony spinal canal stenosis. No lytic or blastic lesion. OTHER NECK: Normal pharynx, larynx and major salivary glands. No cervical lymphadenopathy. Unremarkable thyroid gland. UPPER CHEST: No pneumothorax or pleural effusion. No nodules or masses. AORTIC ARCH: There is mild calcific atherosclerosis of the aortic arch. There is no aneurysm, dissection or hemodynamically significant stenosis of the visualized portion of the aorta. Conventional 3 vessel aortic branching pattern. The visualized proximal subclavian arteries are widely patent. RIGHT CAROTID SYSTEM: No dissection, occlusion or aneurysm. Mild atherosclerotic calcification at the carotid bifurcation without hemodynamically significant stenosis. LEFT CAROTID SYSTEM: Normal without aneurysm, dissection or stenosis. VERTEBRAL ARTERIES: Left dominant configuration. Both origins are clearly patent. There is no dissection, occlusion or  flow-limiting stenosis to the skull base (V1-V3 segments). CTA HEAD FINDINGS POSTERIOR CIRCULATION: --Vertebral arteries: Normal V4 segments. --Inferior cerebellar arteries: Normal. --Basilar artery: Normal. --Superior cerebellar arteries: Normal. --Posterior cerebral arteries (PCA): Normal. 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 Moderate stenosis of the left clinoid segment. --Anterior cerebral arteries (ACA): Normal. Both A1 segments are present. Patent anterior communicating artery (a-comm). --Middle cerebral arteries (MCA): Normal. VENOUS SINUSES: As permitted by contrast timing, patent. ANATOMIC VARIANTS: None Review of the MIP images confirms the above findings. IMPRESSION: 1. No emergent large vessel occlusion. 2. 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 Aortic Atherosclerosis (ICD10-I70.0). Electronically Signed   By: Ulyses Jarred M.D.   On: 09/24/2019 03:00    EKG: Independently reviewed.  NSR  Assessment/Plan Present on Admission: . 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 and moderate stenosis of the left ICA MRI brain pending Lipid panel, A1c pending Echo pending Started on aspirin, ?Plavix in the ED, continue for now Neurology consulted, will follow up once MRI results available Possible vascular surgery evaluation for ICA stenosis Frequent neurochecks, telemetry SLP/PT/OT  Possible hypertension BP uncontrolled Continue to monitor pending MRI  results  Obesity Lifestyle modification advised       DVT prophylaxis: Lovenox  Code  Status: Full  Family Communication: Discussed with patient and husband at bedside  Disposition Plan: Likely home  Consults called: Plan to discuss with neurology once MRI is resulted  Admission status: Observation    Alma Friendly MD [facility]  If 7PM-7AM, please contact night-coverage  09/24/2019, 4:58 PM

## 2019-09-25 ENCOUNTER — Other Ambulatory Visit: Payer: Self-pay | Admitting: Physician Assistant

## 2019-09-25 ENCOUNTER — Observation Stay (HOSPITAL_BASED_OUTPATIENT_CLINIC_OR_DEPARTMENT_OTHER): Payer: Medicare Other

## 2019-09-25 ENCOUNTER — Encounter (HOSPITAL_COMMUNITY): Payer: Self-pay | Admitting: Internal Medicine

## 2019-09-25 ENCOUNTER — Observation Stay (HOSPITAL_COMMUNITY): Payer: Medicare Other

## 2019-09-25 DIAGNOSIS — I639 Cerebral infarction, unspecified: Secondary | ICD-10-CM

## 2019-09-25 DIAGNOSIS — E785 Hyperlipidemia, unspecified: Secondary | ICD-10-CM | POA: Diagnosis not present

## 2019-09-25 DIAGNOSIS — R931 Abnormal findings on diagnostic imaging of heart and coronary circulation: Secondary | ICD-10-CM

## 2019-09-25 DIAGNOSIS — I63342 Cerebral infarction due to thrombosis of left cerebellar artery: Secondary | ICD-10-CM | POA: Diagnosis not present

## 2019-09-25 DIAGNOSIS — Z20822 Contact with and (suspected) exposure to covid-19: Secondary | ICD-10-CM | POA: Diagnosis not present

## 2019-09-25 DIAGNOSIS — G459 Transient cerebral ischemic attack, unspecified: Secondary | ICD-10-CM | POA: Diagnosis not present

## 2019-09-25 DIAGNOSIS — R531 Weakness: Secondary | ICD-10-CM | POA: Diagnosis not present

## 2019-09-25 DIAGNOSIS — R42 Dizziness and giddiness: Secondary | ICD-10-CM | POA: Diagnosis not present

## 2019-09-25 DIAGNOSIS — I253 Aneurysm of heart: Secondary | ICD-10-CM

## 2019-09-25 HISTORY — DX: Cerebral infarction, unspecified: I63.9

## 2019-09-25 LAB — CBC WITH DIFFERENTIAL/PLATELET
Abs Immature Granulocytes: 0.06 10*3/uL (ref 0.00–0.07)
Basophils Absolute: 0 10*3/uL (ref 0.0–0.1)
Basophils Relative: 0 %
Eosinophils Absolute: 0.3 10*3/uL (ref 0.0–0.5)
Eosinophils Relative: 2 %
HCT: 42.7 % (ref 36.0–46.0)
Hemoglobin: 13.9 g/dL (ref 12.0–15.0)
Immature Granulocytes: 1 %
Lymphocytes Relative: 19 %
Lymphs Abs: 2.4 10*3/uL (ref 0.7–4.0)
MCH: 28.7 pg (ref 26.0–34.0)
MCHC: 32.6 g/dL (ref 30.0–36.0)
MCV: 88 fL (ref 80.0–100.0)
Monocytes Absolute: 0.8 10*3/uL (ref 0.1–1.0)
Monocytes Relative: 7 %
Neutro Abs: 8.9 10*3/uL — ABNORMAL HIGH (ref 1.7–7.7)
Neutrophils Relative %: 71 %
Platelets: 274 10*3/uL (ref 150–400)
RBC: 4.85 MIL/uL (ref 3.87–5.11)
RDW: 14.1 % (ref 11.5–15.5)
WBC: 12.5 10*3/uL — ABNORMAL HIGH (ref 4.0–10.5)
nRBC: 0 % (ref 0.0–0.2)

## 2019-09-25 LAB — BASIC METABOLIC PANEL
Anion gap: 11 (ref 5–15)
BUN: 15 mg/dL (ref 8–23)
CO2: 24 mmol/L (ref 22–32)
Calcium: 9.2 mg/dL (ref 8.9–10.3)
Chloride: 103 mmol/L (ref 98–111)
Creatinine, Ser: 0.83 mg/dL (ref 0.44–1.00)
GFR calc Af Amer: >60 mL/min (ref >60)
GFR calc non Af Amer: >60 mL/min (ref >60)
Glucose, Bld: 119 mg/dL — ABNORMAL HIGH (ref 70–99)
Potassium: 3.3 mmol/L — ABNORMAL LOW (ref 3.5–5.1)
Sodium: 138 mmol/L (ref 135–145)

## 2019-09-25 LAB — ECHOCARDIOGRAM COMPLETE
AR max vel: 2.45 cm2
AV Area VTI: 2.19 cm2
AV Area mean vel: 2.34 cm2
AV Mean grad: 4 mmHg
AV Peak grad: 6.3 mmHg
Ao pk vel: 1.25 m/s
Area-P 1/2: 3.02 cm2
Height: 64 in
S' Lateral: 2.9 cm
Weight: 2960 oz

## 2019-09-25 LAB — LIPID PANEL
Cholesterol: 335 mg/dL — ABNORMAL HIGH (ref 0–200)
HDL: 32 mg/dL — ABNORMAL LOW (ref >40)
LDL Cholesterol: 258 mg/dL — ABNORMAL HIGH (ref 0–99)
Total CHOL/HDL Ratio: 10.5 RATIO
Triglycerides: 226 mg/dL — ABNORMAL HIGH (ref <150)
VLDL: 45 mg/dL — ABNORMAL HIGH (ref 0–40)

## 2019-09-25 LAB — HEMOGLOBIN A1C
Hgb A1c MFr Bld: 5.8 % — ABNORMAL HIGH (ref 4.8–5.6)
Mean Plasma Glucose: 119.76 mg/dL

## 2019-09-25 LAB — ECHOCARDIOGRAM LIMITED
Height: 64 in
Weight: 2960 oz

## 2019-09-25 MED ORDER — ATORVASTATIN CALCIUM 80 MG PO TABS: 80.0000 mg | ORAL_TABLET | Freq: Every day | ORAL | 0 refills | Status: DC

## 2019-09-25 MED ORDER — CLOPIDOGREL BISULFATE 75 MG PO TABS: 75.0000 mg | ORAL_TABLET | Freq: Every day | ORAL | 0 refills | Status: AC

## 2019-09-25 MED ORDER — PERFLUTREN LIPID MICROSPHERE
1.0000 mL | INTRAVENOUS | Status: AC | PRN
  Administered 2019-09-25: 4 mL via INTRAVENOUS
  Filled 2019-09-25: qty 10

## 2019-09-25 MED ORDER — ASPIRIN 81 MG PO TBEC: 81.0000 mg | DELAYED_RELEASE_TABLET | Freq: Every day | ORAL | 0 refills | Status: AC

## 2019-09-25 MED ORDER — ASPIRIN EC 81 MG PO TBEC
81.0000 mg | DELAYED_RELEASE_TABLET | Freq: Every day | ORAL | Status: DC
  Administered 2019-09-25: 81 mg via ORAL
  Filled 2019-09-25: qty 1

## 2019-09-25 MED ORDER — POTASSIUM CHLORIDE CRYS ER 20 MEQ PO TBCR
40.0000 meq | EXTENDED_RELEASE_TABLET | Freq: Once | ORAL | Status: AC
  Administered 2019-09-25: 40 meq via ORAL
  Filled 2019-09-25: qty 2

## 2019-09-25 MED ORDER — ATORVASTATIN CALCIUM 80 MG PO TABS
80.0000 mg | ORAL_TABLET | Freq: Every day | ORAL | Status: DC
  Administered 2019-09-25: 80 mg via ORAL
  Filled 2019-09-25: qty 1

## 2019-09-25 NOTE — Progress Notes (Signed)
  Echocardiogram 2D Echocardiogram limited with Definity has been performed.  Jamie Hodge 09/25/2019, 2:33 PM

## 2019-09-25 NOTE — Care Management Obs Status (Signed)
[place] NOTIFICATION   Patient Details  Name: Jamie Hodge MRN: [id number] Date of Birth: [birth year]   Medicare Observation Status Notification Given:  Yes    Carles Collet, RN 09/25/2019, 4:43 PM

## 2019-09-25 NOTE — Consult Note (Addendum)
Cardiology Consultation:   Patient ID: Jamie Hodge; [id number]; 27-Mar-1940   Admit date: 09/23/2019 Date of Consult: 09/25/2019  Primary Care Provider: Patient, No Pcp Per Primary Cardiologist: Elouise Munroe, MD New Primary Electrophysiologist:  None   Patient Profile:   Jamie Hodge is a 79 y.o. female with no hx of HTN, HLD, DM, CAD, who is being seen today for the evaluation of abnl echo in the setting of CVA at the request Dr Horris Latino.  History of Present Illness:   Jamie Hodge was admitted 09/02 with a small subacute left cerebellar infarct. An echo was abnormal and cardiology was asked to see.   Jamie Hodge has no history of ischemic symptoms.  Specifically, she has never had chest pain.  She has some dyspnea on exertion that she feels may be worse than it was 6 months ago.  However, she admits that she has been spectacularly inactive because of Covid.  She has not been getting out and doing things as much as usual.  She does not exercise.  She does do her own housework, and is able to sweep and mop and vacuum without significant shortness of breath.  Housework is the most strenuous thing that she does.  She has no history of lower extremity edema, denies orthopnea or PND.  She has no history of palpitations.  She has no history of presyncope or syncope.  She started having episodes of dizziness about a week ago.  She had one episode of dizziness a week ago that was associated with right arm weakness and speech difficulties.  EMS was called but the symptoms resolved before they got there.  She had some other episodes of dizziness and finally came to the hospital where they found her stroke.  The dizziness has resolved and she is feeling much better.   Past Medical History:  Diagnosis Date  . Herpes zoster 2012  . Vertigo     Past Surgical History:  Procedure Laterality Date  . RHINOPLASTY       Prior to Admission medications   Medication Sig Start  Date End Date Taking? Authorizing Provider  Multiple Vitamin (MULTIVITAMIN ADULT PO) Take 1 tablet by mouth daily.   Yes [provider]    Inpatient Medications: Scheduled Meds: . aspirin EC  81 mg Oral Daily  . atorvastatin  80 mg Oral Daily  . clopidogrel  75 mg Oral Daily  . enoxaparin (LOVENOX) injection  40 mg Subcutaneous Q24H   Continuous Infusions:  PRN Meds: acetaminophen **OR** acetaminophen (TYLENOL) oral liquid 160 mg/5 mL **OR** acetaminophen, perflutren lipid microspheres (DEFINITY) IV suspension, senna-docusate  Allergies:    Allergies  Allergen Reactions  . Mango Flavor Anaphylaxis  . Contrast Media [Iodinated Diagnostic Agents] Other (See Comments)    Reports decline in kidney function "years ago"    Social History:   Social History   Socioeconomic History  . Marital status: Married    Spouse name: Not on file  . Number of children: Not on file  . Years of education: Not on file  . Highest education level: Not on file  Occupational History  . Not on file  Tobacco Use  . Smoking status: Never Smoker  . Smokeless tobacco: Never Used  Substance and Sexual Activity  . Alcohol use: No  . Drug use: No  . Sexual activity: Never    Birth control/protection: None  Other Topics Concern  . Not on file  Social History Narrative  . Not on file  Social Determinants of Health   Financial Resource Strain:   . Difficulty of Paying Living Expenses: Not on file  Food Insecurity:   . Worried About Charity fundraiser in the Last Year: Not on file  . Ran Out of Food in the Last Year: Not on file  Transportation Needs:   . Lack of Transportation (Medical): Not on file  . Lack of Transportation (Non-Medical): Not on file  Physical Activity:   . Days of Exercise per Week: Not on file  . Minutes of Exercise per Session: Not on file  Stress:   . Feeling of Stress : Not on file  Social Connections:   . Frequency of Communication with Friends and  Family: Not on file  . Frequency of Social Gatherings with Friends and Family: Not on file  . Attends Religious Services: Not on file  . Active Member of Clubs or Organizations: Not on file  . Attends Archivist Meetings: Not on file  . Marital Status: Not on file  Intimate Partner Violence:   . Fear of Current or Ex-Partner: Not on file  . Emotionally Abused: Not on file  . Physically Abused: Not on file  . Sexually Abused: Not on file    Family History:   Family History  Problem Relation Age of Onset  . Unexplained death Mother        Died at the age of 16, no autopsy performed  . CAD Father 83  . Lung cancer Father 3   Family Status:  Family Status  Relation Name Status  . Mother  Deceased  . Father  Deceased    ROS:  Please see the history of present illness.  All other ROS reviewed and negative.     Physical Exam/Data:   Vitals:   09/24/19 2329 09/25/19 0354 09/25/19 0753 09/25/19 1203  BP: (!) 158/71 (!) 147/55 (!) 144/66 (!) 153/77  Pulse: 82 69 76 87  Resp: 18 18 16 18   Temp: 98.2 F (36.8 C) 97.9 F (36.6 C) 98 F (36.7 C) 97.6 F (36.4 C)  TempSrc: Oral Oral Oral Oral  SpO2: 95% 95% 94% 97%  Weight:      Height:        Intake/Output Summary (Last 24 hours) at 09/25/2019 1423 Last data filed at 09/25/2019 1300 Gross per 24 hour  Intake 720 ml  Output --  Net 720 ml    Last 3 Weights 09/23/2019 10/09/2012 09/03/2010  Weight (lbs) 185 lb 190 lb 192 lb  Weight (kg) 83.915 kg 86.183 kg 87.091 kg     Body mass index is 31.76 kg/m.   General:  Well nourished, well developed, female in no acute distress HEENT: normal Lymph: no adenopathy Neck: JVD -not elevated Endocrine:  No thryomegaly Vascular: No carotid bruits; 4/4 extremity pulses 2+  Cardiac:  normal S1, S2; RRR; no murmur Lungs:  clear bilaterally, no wheezing, rhonchi or rales  Abd: soft, nontender, no hepatomegaly  Ext: no edema Musculoskeletal:  No deformities, BUE and BLE  strength normal and equal Skin: warm and dry  Neuro:  CNs 2-12 intact, no focal abnormalities noted Psych:  Normal affect   EKG:  The EKG was personally reviewed and demonstrates: 9/2 ECG sinus rhythm, heart rate 90, low voltage precordial leads Telemetry:  Telemetry was personally reviewed and demonstrates: Sinus rhythm, sinus tachycardia, occasional PVCs   CV studies:   ECHO: 09/25/2019 1. Left ventricular ejection fraction, by estimation, is 65 to 70%. The  left ventricle has normal function. The left ventricle demonstrates  regional wall motion abnormalities (see scoring diagram/findings for  description). Left ventricular diastolic parameters are consistent with Grade I diastolic dysfunction (impaired relaxation). There is severe akinesis of the left ventricular, apical apical segment.  2. Right ventricular systolic function is normal. The right ventricular  size is normal.  3. The mitral valve is grossly normal. No evidence of mitral valve  regurgitation. No evidence of mitral stenosis.  4. The aortic valve is grossly normal. Aortic valve regurgitation is not  visualized. No aortic stenosis is present.    Laboratory Data:   Chemistry Recent Labs  Lab 09/23/19 1420 09/25/19 0302  NA 136 138  K 4.0 3.3*  CL 103 103  CO2 23 24  GLUCOSE 109* 119*  BUN 14 15  CREATININE 0.65 0.83  CALCIUM 9.3 9.2  GFRNONAA >60 >60  GFRAA >60 >60  ANIONGAP 10 11    No results found for: ALT, AST, GGT, ALKPHOS, BILITOT Hematology Recent Labs  Lab 09/23/19 1420 09/25/19 0302  WBC 8.9 12.5*  RBC 4.94 4.85  HGB 14.6 13.9  HCT 43.4 42.7  MCV 87.9 88.0  MCH 29.6 28.7  MCHC 33.6 32.6  RDW 13.9 14.1  PLT 267 274   Cardiac Enzymes High Sensitivity Troponin:   Recent Labs  Lab 09/23/19 2010  TROPONINIHS 6      BNPNo results for input(s): BNP, PROBNP in the last 168 hours.  DDimer No results for input(s): DDIMER in the last 168 hours. TSH: No results found for:  TSH Lipids: Lab Results  Component Value Date   CHOL 335 (H) 09/25/2019   HDL 32 (L) 09/25/2019   LDLCALC 258 (H) 09/25/2019   TRIG 226 (H) 09/25/2019   CHOLHDL 10.5 09/25/2019   HgbA1c: Lab Results  Component Value Date   HGBA1C 5.8 (H) 09/25/2019   Magnesium: No results found for: MG   Radiology/Studies:  CT Angio Head W or Wo Contrast  Result Date: 09/24/2019 CLINICAL DATA:  Dizziness with history of vertigo. EXAM: CT ANGIOGRAPHY HEAD AND NECK TECHNIQUE: Multidetector CT imaging of the head and neck was performed using the standard protocol during bolus administration of intravenous contrast. Multiplanar CT image reconstructions and MIPs were obtained to evaluate the vascular anatomy. Carotid stenosis measurements (when applicable) are obtained utilizing NASCET criteria, using the distal internal carotid diameter as the denominator. CONTRAST:  61mL OMNIPAQUE IOHEXOL 350 MG/ML SOLN COMPARISON:  None. FINDINGS: CT HEAD FINDINGS Brain: There is no mass, hemorrhage or extra-axial collection. The size and configuration of the ventricles and extra-axial CSF spaces are normal. There is no acute or chronic infarction. The brain parenchyma is normal. Skull: The visualized skull base, calvarium and extracranial soft tissues are normal. Sinuses/Orbits: No fluid levels or advanced mucosal thickening of the visualized paranasal sinuses. No mastoid or middle ear effusion. The orbits are normal. CTA NECK FINDINGS SKELETON: There is no bony spinal canal stenosis. No lytic or blastic lesion. OTHER NECK: Normal pharynx, larynx and major salivary glands. No cervical lymphadenopathy. Unremarkable thyroid gland. UPPER CHEST: No pneumothorax or pleural effusion. No nodules or masses. AORTIC ARCH: There is mild calcific atherosclerosis of the aortic arch. There is no aneurysm, dissection or hemodynamically significant stenosis of the visualized portion of the aorta. Conventional 3 vessel aortic branching pattern.  The visualized proximal subclavian arteries are widely patent. RIGHT CAROTID SYSTEM: No dissection, occlusion or aneurysm. Mild atherosclerotic calcification at the carotid bifurcation without hemodynamically significant stenosis. LEFT CAROTID SYSTEM:  Normal without aneurysm, dissection or stenosis. VERTEBRAL ARTERIES: Left dominant configuration. Both origins are clearly patent. There is no dissection, occlusion or flow-limiting stenosis to the skull base (V1-V3 segments). CTA HEAD FINDINGS POSTERIOR CIRCULATION: --Vertebral arteries: Normal V4 segments. --Inferior cerebellar arteries: Normal. --Basilar artery: Normal. --Superior cerebellar arteries: Normal. --Posterior cerebral arteries (PCA): Normal. 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 Moderate stenosis of the left clinoid segment. --Anterior cerebral arteries (ACA): Normal. Both A1 segments are present. Patent anterior communicating artery (a-comm). --Middle cerebral arteries (MCA): Normal. VENOUS SINUSES: As permitted by contrast timing, patent. ANATOMIC VARIANTS: None Review of the MIP images confirms the above findings. IMPRESSION: 1. No emergent large vessel occlusion. 2. 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 Aortic Atherosclerosis (ICD10-I70.0). Electronically Signed   By: Ulyses Jarred M.D.   On: 09/24/2019 03:00   CT Head Wo Contrast  Result Date: 09/23/2019 CLINICAL DATA:  Transient ischemic attack, vertigo, right arm weakness EXAM: CT HEAD WITHOUT CONTRAST TECHNIQUE: Contiguous axial images were obtained from the base of the skull through the vertex without intravenous contrast. COMPARISON:  None. FINDINGS: Brain: Normal anatomic configuration. No abnormal intra or extra-axial mass lesion or fluid collection. No abnormal mass effect or midline  shift. No evidence of acute intracranial hemorrhage or infarct. Ventricular size is normal. Cerebellum unremarkable. Vascular: Unremarkable Skull: Intact Sinuses/Orbits: Paranasal sinuses are clear. Orbits are unremarkable. Other: Mastoid air cells and middle ear cavities are clear. IMPRESSION: No evidence of acute intracranial abnormality. Electronically Signed   By: Fidela Salisbury MD   On: 09/23/2019 20:35   CT Angio Neck W and/or Wo Contrast  Result Date: 09/24/2019 CLINICAL DATA:  Dizziness with history of vertigo. EXAM: CT ANGIOGRAPHY HEAD AND NECK TECHNIQUE: Multidetector CT imaging of the head and neck was performed using the standard protocol during bolus administration of intravenous contrast. Multiplanar CT image reconstructions and MIPs were obtained to evaluate the vascular anatomy. Carotid stenosis measurements (when applicable) are obtained utilizing NASCET criteria, using the distal internal carotid diameter as the denominator. CONTRAST:  24mL OMNIPAQUE IOHEXOL 350 MG/ML SOLN COMPARISON:  None. FINDINGS: CT HEAD FINDINGS Brain: There is no mass, hemorrhage or extra-axial collection. The size and configuration of the ventricles and extra-axial CSF spaces are normal. There is no acute or chronic infarction. The brain parenchyma is normal. Skull: The visualized skull base, calvarium and extracranial soft tissues are normal. Sinuses/Orbits: No fluid levels or advanced mucosal thickening of the visualized paranasal sinuses. No mastoid or middle ear effusion. The orbits are normal. CTA NECK FINDINGS SKELETON: There is no bony spinal canal stenosis. No lytic or blastic lesion. OTHER NECK: Normal pharynx, larynx and major salivary glands. No cervical lymphadenopathy. Unremarkable thyroid gland. UPPER CHEST: No pneumothorax or pleural effusion. No nodules or masses. AORTIC ARCH: There is mild calcific atherosclerosis of the aortic arch. There is no aneurysm, dissection or hemodynamically significant  stenosis of the visualized portion of the aorta. Conventional 3 vessel aortic branching pattern. The visualized proximal subclavian arteries are widely patent. RIGHT CAROTID SYSTEM: No dissection, occlusion or aneurysm. Mild atherosclerotic calcification at the carotid bifurcation without hemodynamically significant stenosis. LEFT CAROTID SYSTEM: Normal without aneurysm, dissection or stenosis. VERTEBRAL ARTERIES: Left dominant configuration. Both origins are clearly patent. There is no dissection, occlusion or flow-limiting stenosis to the skull base (V1-V3 segments). CTA HEAD FINDINGS POSTERIOR CIRCULATION: --  Vertebral arteries: Normal V4 segments. --Inferior cerebellar arteries: Normal. --Basilar artery: Normal. --Superior cerebellar arteries: Normal. --Posterior cerebral arteries (PCA): Normal. 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 Moderate stenosis of the left clinoid segment. --Anterior cerebral arteries (ACA): Normal. Both A1 segments are present. Patent anterior communicating artery (a-comm). --Middle cerebral arteries (MCA): Normal. VENOUS SINUSES: As permitted by contrast timing, patent. ANATOMIC VARIANTS: None Review of the MIP images confirms the above findings. IMPRESSION: 1. No emergent large vessel occlusion. 2. 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 Aortic Atherosclerosis (ICD10-I70.0). Electronically Signed   By: Ulyses Jarred M.D.   On: 09/24/2019 03:00   MR BRAIN WO CONTRAST  Result Date: 09/24/2019 CLINICAL DATA:  TIA.  Intermittent dizziness and right arm weakness. EXAM: MRI HEAD WITHOUT CONTRAST TECHNIQUE: Multiplanar, multiecho pulse sequences of the brain and surrounding structures were obtained without intravenous contrast. COMPARISON:  Head CT 09/23/2019 FINDINGS: Brain: There is a 7 mm  focus of hyperintense trace diffusion weighted signal deep in the left cerebellar hemisphere with associated T2/FLAIR hyperintensity and heterogeneous ADC (including a possibly mildly reduced component) suggesting a subacute infarct. No intracranial hemorrhage, midline shift, or extra-axial fluid collection is identified. There is a 1 cm extra-axial focus of dense calcification to the right of the falx in the high posterior frontal region near the vertex without associated mass effect on the adjacent brain or edema. Small foci of T2 hyperintensity in cerebral white matter bilaterally are nonspecific but compatible with mild chronic small vessel ischemic disease. Mild cerebral atrophy is within normal limits for age. Vascular: Major intracranial vascular flow voids are preserved. Skull and upper cervical spine: Unremarkable bone marrow signal. Sinuses/Orbits: Unremarkable orbits. Paranasal sinuses and mastoid air cells are clear. Other: None. IMPRESSION: 1. Small subacute left cerebellar infarct. 2. Mild chronic small vessel ischemic disease. 3. 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 Electronically Signed   By: Logan Bores M.D.   On: 09/24/2019 21:38   ECHOCARDIOGRAM COMPLETE  Result Date: 09/25/2019    ECHOCARDIOGRAM REPORT   Patient Name:   Jamie Hodge Date of Exam: 09/25/2019 Medical Rec #:  [id number]         Height:       64.0 in Accession #:    [id number]        Weight:       185.0 lb Date of Birth:  1940/02/12         BSA:          1.893 m Patient Age:    64 years          BP:           144/66 mmHg Patient Gender: F                 HR:           76 bpm. Exam Location:  Inpatient Procedure: 2D Echo, Cardiac Doppler and Color Doppler Indications:    TIA  History:        Patient has no prior history of Echocardiogram examinations.  Sonographer:    Clayton Lefort RDCS (AE) Referring Phys: [id number] [place]  Sonographer  Comments: Suboptimal parasternal window. Image acquisition challenging due to respiratory motion. IMPRESSIONS  1. Left ventricular ejection fraction, by estimation, is  65 to 70%. The left ventricle has normal function. The left ventricle demonstrates regional wall motion abnormalities (see scoring diagram/findings for description). Left ventricular diastolic parameters are consistent with Grade I diastolic dysfunction (impaired relaxation). There is severe akinesis of the left ventricular, apical apical segment.  2. Right ventricular systolic function is normal. The right ventricular size is normal.  3. The mitral valve is grossly normal. No evidence of mitral valve regurgitation. No evidence of mitral stenosis.  4. The aortic valve is grossly normal. Aortic valve regurgitation is not visualized. No aortic stenosis is present. FINDINGS  Left Ventricle: Left ventricular ejection fraction, by estimation, is 65 to 70%. The left ventricle has normal function. The left ventricle demonstrates regional wall motion abnormalities. Severe akinesis of the left ventricular, apical apical segment. The left ventricular internal cavity size was small. There is no left ventricular hypertrophy. Left ventricular diastolic parameters are consistent with Grade I diastolic dysfunction (impaired relaxation). Right Ventricle: The right ventricular size is normal. No increase in right ventricular wall thickness. Right ventricular systolic function is normal. Left Atrium: Left atrial size was normal in size. Right Atrium: Right atrial size was normal in size. Pericardium: There is no evidence of pericardial effusion. Mitral Valve: The mitral valve is grossly normal. No evidence of mitral valve regurgitation. No evidence of mitral valve stenosis. MV peak gradient, 5.8 mmHg. The mean mitral valve gradient is 2.0 mmHg. Tricuspid Valve: The tricuspid valve is not assessed. Tricuspid valve regurgitation is not demonstrated. Aortic Valve: The  aortic valve is grossly normal. Aortic valve regurgitation is not visualized. No aortic stenosis is present. Aortic valve mean gradient measures 4.0 mmHg. Aortic valve peak gradient measures 6.2 mmHg. Aortic valve area, by VTI measures 2.19 cm. Pulmonic Valve: The pulmonic valve was grossly normal. Pulmonic valve regurgitation is not visualized. Aorta: The aortic root and ascending aorta are structurally normal, with no evidence of dilitation. IAS/Shunts: The atrial septum is grossly normal.  LEFT VENTRICLE PLAX 2D LVIDd:         4.30 cm  Diastology LVIDs:         2.90 cm  LV e' lateral:   5.68 cm/s LV PW:         1.20 cm  LV E/e' lateral: 10.2 LV IVS:        1.20 cm  LV e' medial:    4.30 cm/s LVOT diam:     2.00 cm  LV E/e' medial:  13.5 LV SV:         54 LV SV Index:   29 LVOT Area:     3.14 cm  RIGHT VENTRICLE            IVC RV Basal diam:  2.40 cm    IVC diam: 1.40 cm RV S prime:     9.00 cm/s TAPSE (M-mode): 1.7 cm LEFT ATRIUM             Index       RIGHT ATRIUM          Index LA diam:        3.40 cm 1.80 cm/m  RA Area:     9.62 cm LA Vol (A2C):   42.1 ml 22.24 ml/m RA Volume:   17.30 ml 9.14 ml/m LA Vol (A4C):   33.7 ml 17.81 ml/m LA Biplane Vol: 38.8 ml 20.50 ml/m  AORTIC VALVE AV Area (Vmax):    2.45 cm AV Area (Vmean):   2.34 cm AV Area (VTI):  2.19 cm AV Vmax:           125.00 cm/s AV Vmean:          89.400 cm/s AV VTI:            0.248 m AV Peak Grad:      6.2 mmHg AV Mean Grad:      4.0 mmHg LVOT Vmax:         97.60 cm/s LVOT Vmean:        66.700 cm/s LVOT VTI:          0.173 m LVOT/AV VTI ratio: 0.70  AORTA Ao Root diam: 3.20 cm Ao Asc diam:  3.00 cm MITRAL VALVE MV Area (PHT): 3.02 cm     SHUNTS MV Peak grad:  5.8 mmHg     Systemic VTI:  0.17 m MV Mean grad:  2.0 mmHg     Systemic Diam: 2.00 cm MV Vmax:       1.20 m/s MV Vmean:      68.1 cm/s MV Decel Time: 251 msec MV E velocity: 58.00 cm/s MV A velocity: 102.00 cm/s MV E/A ratio:  0.57 Mertie Moores MD Electronically signed by Mertie Moores MD Signature Date/Time: 09/25/2019/12:51:51 PM    Final     Assessment and Plan:   1.  Abnormal echocardiogram -On initial echocardiogram, her EF is normal but wall motion abnormalities were noted. -Repeat limited echocardiogram with [facility] is pending -She is on aspirin and Plavix because of her stroke -She has no discernible ischemic symptoms - no recent checkup, no known hx HTN, HLD but has elevated BP and cholesterol here - discuss BB w/ MD but currently has permissive hypertension per Neuro team  2. CVA - per Neuro/IM, doing well -No known cause -May need monitor at discharge  3. HLD - started on high-dose statin - LFTs ok - recheck 6-12 weeks  4.  Hypertension: -New diagnosis, permissive hypertension per Neuro -Follow-up as outpatient  Principal Problem:   TIA (transient ischemic attack)     For questions or updates, please contact [place] Please consult [email or web address] for contact info under Cardiology/STEMI.   Signed, Rosaria Ferries, PA-C  09/25/2019 2:23 PM  ---------------------------------------------------------------------------------------------   History and all data above reviewed.  Patient examined.  I agree with the findings as above.  Keyunna Coco is a pleasant 79 yo female with admission for stroke. Risk factors include HLD, elevated BP. Found to have apical akinesis with aneurysmal appearance, no LV thrombus. No known CP OR SOB, denies symptoms at home.   Constitutional: No acute distress Eyes: sclera non-icteric, normal conjunctiva and lids ENMT: normal dentition, moist mucous membranes Cardiovascular: regular rhythm, normal rate, no murmurs. S1 and S2 normal. Radial pulses normal bilaterally. No jugular venous distention.  Respiratory: clear to auscultation bilaterally GI : normal bowel sounds, soft and nontender. No distention.   MSK: extremities warm, well perfused. No edema.  NEURO: grossly nonfocal exam, moves all  extremities. PSYCH: alert and oriented x 3, normal mood and affect.   All available labs, radiology testing, previous records reviewed. 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 No evidence of LV thrombus on imaging, though cannot entirely exclude this as an etiology for stroke.  She needs an ischemic workup, patient is quite eager to leave the hospital. In addition she would need BB for optimal CCTA imaging and she currently requires permissive HTN. We will therefore plan CCTA as outpatient to evaluate the apical aneurysm further as well as assess coronary arteries. Currently, no strong indication for anticoagulation purely due to apical aneurysm, remainder of LV function intact.   Agree with ASA, Plavix and statin.   I discussed this in detail with patient and husband who was present for discussion and collaborative history.    Length of Stay:  LOS: 0 days   Elouise Munroe, MD [facility] 4:11 PM  09/25/2019

## 2019-09-25 NOTE — Evaluation (Signed)
Occupational Therapy Evaluation Patient Details Name: Jamie Hodge MRN: [id number] DOB: 04-27-1940 Today's Date: 09/25/2019    History of Present Illness Pt is a 79 y.o. female who has had some dizziness for a week or two and has had two episodes over the past week of right arm weakness with more intense vertigo. 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 No PMH on file.    Clinical Impression   Patient evaluated by Occupational Therapy with no further acute OT needs identified. All education has been completed and the patient has no further questions. Pt is able to perform ADLs independently, and was able to perform mod complex path finding with supervision and no errors.  She does demonstrates impaired visual saccades, but this will likely have little impact as she currently doesn't drive, and does not go into the community due to [place].  See below for any follow-up Occupational Therapy or equipment needs. OT is signing off. Thank you for this referral.      Follow Up Recommendations  No OT follow up    Equipment Recommendations  None recommended by OT    Recommendations for Other Services       Precautions / Restrictions Precautions Precautions: None      Mobility Bed Mobility Overal bed mobility: Modified Independent                Transfers Overall transfer level: Independent Equipment used: None                  Balance Overall balance assessment: Modified Independent                                         ADL either performed or assessed with clinical judgement   ADL Overall ADL's : Independent                                             Vision Baseline Vision/History: No visual deficits Patient Visual Report: No change from baseline Vision Assessment?: Yes Eye Alignment: Within Functional Limits Ocular Range of Motion: Within Functional Limits Alignment/Gaze Preference: Within Defined  Limits Tracking/Visual Pursuits: Able to track stimulus in all quads without difficulty Saccades: Additional head turns occurred during testing;Additional eye shifts occurred during testing;Undershoots;Decreased speed of saccadic movement Visual Fields: No apparent deficits Additional Comments: Pt frequently undershot during dynamic saccades, and required small head turns      Perception Perception Perception Tested?: Yes   Praxis Praxis Praxis tested?: Within functional limits    Pertinent Vitals/Pain Pain Assessment: No/denies pain     Hand Dominance Left   Extremity/Trunk Assessment Upper Extremity Assessment Upper Extremity Assessment: Overall WFL for tasks assessed   Lower Extremity Assessment Lower Extremity Assessment: Overall WFL for tasks assessed   Cervical / Trunk Assessment Cervical / Trunk Assessment: Normal   Communication Communication Communication: No difficulties   Cognition Arousal/Alertness: Awake/alert Behavior During Therapy: WFL for tasks assessed/performed Overall Cognitive Status: Within Functional Limits for tasks assessed                                 General Comments: Pt followed 4 step command.  she was able to perform moderately complex path finding task using  signage mod I, and was able to find her way back to room independently    General Comments  Reviewed BEFAST with pt     Exercises     Shoulder Instructions      Home Living Family/patient expects to be discharged to:: Private residence Living Arrangements: Spouse/significant other Available Help at Discharge: Family;Available 24 hours/day Type of Home: House Home Access: Stairs to enter [employer] of Steps: 2 Entrance Stairs-Rails: None Home Layout: One level     Bathroom Shower/Tub: Occupational psychologist: Handicapped height     Home Equipment: Shower seat;Cane - single point;Walker - 2 wheels          Prior  Functioning/Environment Level of Independence: Independent        Comments: Retired, [employer] manager, but hasn't driven in some time due to pandemic.  independent with everything at baseline        OT Problem List: Impaired vision/perception      OT Treatment/Interventions:      OT Goals(Current goals can be found in the care plan section) Acute Rehab OT Goals Patient Stated Goal: Home  OT Goal Formulation: All assessment and education complete, DC therapy  OT Frequency:     Barriers to D/C:            Co-evaluation              AM-PAC OT "6 Clicks" Daily Activity     Outcome Measure Help from another person eating meals?: None Help from another person taking care of personal grooming?: None Help from another person toileting, which includes using toliet, bedpan, or urinal?: None Help from another person bathing (including washing, rinsing, drying)?: None Help from another person to put on and taking off regular upper body clothing?: None Help from another person to put on and taking off regular lower body clothing?: None 6 Click Score: 24   End of Session    Activity Tolerance: Patient tolerated treatment well Patient left: in bed;with call bell/phone within reach;Other (comment) (echo Tech )  OT Visit Diagnosis: Other (comment) (impaired saccades )                Time: 5409-8119 OT Time Calculation (min): 24 min Charges:  OT General Charges $OT Visit: 1 Visit OT Evaluation $OT Eval Low Complexity: 1 Low OT Treatments $Therapeutic Activity: 8-22 mins  Nilsa Nutting., OTR/L Acute Rehabilitation Services Pager [phone] Office [phone]   Lucille Passy M 09/25/2019, 2:25 PM

## 2019-09-25 NOTE — Consult Note (Signed)
Neurology Consultation Reason for Consult: Transient right arm weakness Referring Physician: Kendal Hymen  CC: Transient right arm weakness  History is obtained from: Patient  HPI: Vyolet Sakuma is a 79 y.o. female who has had some dizziness for a week or two and has had two episodes over the past week of right arm weakness with more intense vertigo.  She states "I have a history of vertigo."  She currently does not notice any persistent symptoms.  She has not had any nausea or vomiting.  She denies any double vision.   LKW: Unclear, sometime over the past week tpa given?: no, outside of window   ROS: A 14 point ROS was performed and is negative except as noted in the HPI.   History reviewed. No pertinent past medical history.   Family history: No history of stroke   Social History:  reports that she has never smoked. She has never used smokeless tobacco. She reports that she does not drink alcohol and does not use drugs.   Exam: Current vital signs: BP (!) 158/71 (BP Location: Left Arm)    Pulse 82    Temp 98.2 F (36.8 C) (Oral)    Resp 18    Ht 5\' 4"  (1.626 m)    Wt 83.9 kg    SpO2 95%    BMI 31.76 kg/m  Vital signs in last 24 hours: Temp:  [97.8 F (36.6 C)-98.2 F (36.8 C)] 98.2 F (36.8 C) (09/03 2329) Pulse Rate:  [72-102] 82 (09/03 2329) Resp:  [10-24] 18 (09/03 2329) BP: (139-176)/(71-139) 158/71 (09/03 2329) SpO2:  [93 %-99 %] 95 % (09/03 2329)   Physical Exam  Constitutional: Appears well-developed and well-nourished.  Psych: Affect appropriate to situation Eyes: No scleral injection HENT: No OP obstrucion MSK: no joint deformities.  Cardiovascular: Normal rate and regular rhythm.  Respiratory: Effort normal, non-labored breathing GI: Soft.  No distension. There is no tenderness.  Skin: WDI  Neuro: Mental Status: Patient is awake, alert, oriented to person, place, month, year, and situation. Patient is able to give a clear and coherent  history. No signs of aphasia or neglect Cranial Nerves: II: Visual Fields are full. Pupils are equal, round, and reactive to light.   III,IV, VI: EOMI without ptosis or diploplia.  V: Facial sensation is symmetric to temperature VII: Facial movement is symmetric.  VIII: hearing is intact to voice X: Uvula elevates symmetrically XI: Shoulder shrug is symmetric. XII: tongue is midline without atrophy or fasciculations.  Motor: Tone is normal. Bulk is normal. 5/5 strength was present in all four extremities.  Sensory: Sensation is symmetric to light touch and temperature in the arms and legs. Cerebellar: FNF intact bilaterally   I have reviewed labs in epic and the results pertinent to this consultation are: BMP-unremarkable  I have reviewed the images obtained: Small subacute infarct in the left cerebellum  Impression: 79 year old female with a history of two episodes of right arm weakness and dizziness with a small subacute left cerebellar infarct.  No clear source of embolus on CT angiogram.  I doubt her ICA stenosis is symptomatic currently.  She has been admitted for secondary risk factor modification and therapy.  Recommendations: - HgbA1c, fasting lipid panel - Frequent neuro checks - Echocardiogram - Prophylactic therapy-Antiplatelet med: Aspirin -81 mg daily and Plavix 75 mg daily following 30 mg load. - Risk factor modification - Telemetry monitoring - PT consult, OT consult, Speech consult - Stroke team to follow    Roland Rack,  MD [facility] [phone]  If 7pm- 7am, please page neurology on call as listed in [place].

## 2019-09-25 NOTE — Progress Notes (Signed)
STROKE TEAM PROGRESS NOTE   INTERVAL HISTORY Her husband and echo tech are at the bedside.  Patient stated that she had history of vertigo, however for the last 1 to 2 weeks she had on and off lightheadedness, not vertigo, she just felt not right.  Yesterday, she felt right arm weakness with vertigo and slurred speech, no nausea vomiting lasting 5 minutes and resolved.  MRI showed left cerebellum small subacute infarct.  2D echo showed normal EF but apical akinesis with aneurysmal appearance, but no LV thrombus.  Recommend outpatient cardiology follow-up.  OBJECTIVE Vitals:   09/24/19 2329 09/25/19 0354 09/25/19 0753 09/25/19 1203  BP: (!) 158/71 (!) 147/55 (!) 144/66 (!) 153/77  Pulse: 82 69 76 87  Resp: 18 18 16 18   Temp: 98.2 F (36.8 C) 97.9 F (36.6 C) 98 F (36.7 C) 97.6 F (36.4 C)  TempSrc: Oral Oral Oral Oral  SpO2: 95% 95% 94% 97%  Weight:      Height:        CBC:  Recent Labs  Lab 09/23/19 1420 09/25/19 0302  WBC 8.9 12.5*  NEUTROABS  --  8.9*  HGB 14.6 13.9  HCT 43.4 42.7  MCV 87.9 88.0  PLT 267 440    Basic Metabolic Panel:  Recent Labs  Lab 09/23/19 1420 09/25/19 0302  NA 136 138  K 4.0 3.3*  CL 103 103  CO2 23 24  GLUCOSE 109* 119*  BUN 14 15  CREATININE 0.65 0.83  CALCIUM 9.3 9.2    Lipid Panel:     Component Value Date/Time   CHOL 335 (H) 09/25/2019 0302   TRIG 226 (H) 09/25/2019 0302   HDL 32 (L) 09/25/2019 0302   CHOLHDL 10.5 09/25/2019 0302   VLDL 45 (H) 09/25/2019 0302   LDLCALC 258 (H) 09/25/2019 0302   HgbA1c:  Lab Results  Component Value Date   HGBA1C 5.8 (H) 09/25/2019   Urine Drug Screen: No results found for: LABOPIA, COCAINSCRNUR, LABBENZ, AMPHETMU, THCU, LABBARB  Alcohol Level No results found for: ETH  IMAGING  CT Angio Head W or Wo Contrast CT Angio Neck W and/or Wo Contrast 09/24/2019 IMPRESSION:  1. No emergent large vessel occlusion.  2. 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  Aortic Atherosclerosis (ICD10-I70.0).   CT Head Wo Contrast 09/23/2019 IMPRESSION:  No evidence of acute intracranial abnormality.   MR BRAIN WO CONTRAST 09/24/2019 IMPRESSION:  1. Small subacute left cerebellar infarct.  2. Mild chronic small vessel ischemic disease.  3. 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   ECHOCARDIOGRAM COMPLETE 09/25/2019 IMPRESSIONS   1. Left ventricular ejection fraction, by estimation, is 65 to 70%. The left ventricle has normal function. The left ventricle demonstrates regional wall motion abnormalities (see scoring diagram/findings for description). Left ventricular diastolic parameters are consistent with Grade I diastolic dysfunction (impaired relaxation). There is severe akinesis of the left ventricular, apical apical segment.   2. Right ventricular systolic function is normal. The right ventricular size is normal.   3. The mitral valve is grossly normal. No evidence of mitral valve regurgitation. No evidence of mitral stenosis.   4. The aortic valve is grossly normal. Aortic valve regurgitation is not visualized. No aortic stenosis is present.    ECG - SR rate 90 BPM. (See cardiology reading for complete details)  PHYSICAL EXAM  Temp:  [97.6 F (36.4 C)-98.2 F (36.8 C)] 98.1 F (36.7 C) (09/04 1546) Pulse Rate:  [69-101] 83 (09/04 1546) Resp:  [16-18] 18 (09/04 1546) BP: (140-158)/(55-77) 140/62 (09/04 1546) SpO2:  [94 %-97 %] 97 % (09/04 1546)  General - Well nourished, well developed, in no apparent distress.  Ophthalmologic - fundi not visualized due to noncooperation.  Cardiovascular - Regular rhythm and rate.  Mental Status -  Level of arousal and orientation to time, place, and person were intact. Language including expression, naming, repetition, comprehension was  assessed and found intact. Fund of Knowledge was assessed and was intact.  Cranial Nerves II - XII - II - Visual field intact OU. III, IV, VI - Extraocular movements intact. V - Facial sensation intact bilaterally VII - Facial movement intact bilaterally. VIII - Hearing & vestibular intact bilaterally. X - Palate elevates symmetrically. XI - Chin turning & shoulder shrug intact bilaterally. XII - Tongue protrusion intact.  Motor Strength - The patient's strength was normal in all extremities and pronator drift was absent.  Bulk was normal and fasciculations were absent.   Motor Tone - Muscle tone was assessed at the neck and appendages and was normal.  Reflexes - The patient's reflexes were symmetrical in all extremities and she had no pathological reflexes.  Sensory - Light touch, temperature/pinprick were assessed and were symmetrical.    Coordination - The patient had normal movements in the hands and feet with no ataxia or dysmetria.  Tremor was absent.  Gait and Station - deferred.   ASSESSMENT/PLAN [name] is a 79 y.o. female with history of vertigo presenting with dizziness and right arm weakness. She did not receive IV t-PA due to late presentation (>4.5 hours from time of onset).  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   CT head - No evidence of acute intracranial abnormality.  MRI head - Small subacute left cerebellar infarct. Mild chronic small vessel ischemic disease.   CTA H&N -  No emergent large vessel occlusion. 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 Please call with questions. Pt will follow up with stroke clinic NP at [facility] in about 4 weeks. Thanks for the consult.  Rosalin Hawking, MD PhD Stroke Neurology 09/25/2019 6:16 PM    To contact Stroke Continuity provider, please refer to [email or web address] After hours, contact General Neurology

## 2019-09-25 NOTE — Progress Notes (Signed)
  Echocardiogram 2D Echocardiogram has been performed.  Jamie Hodge 09/25/2019, 8:46 AM

## 2019-09-25 NOTE — Evaluation (Signed)
Physical Therapy Evaluation and Discharge Patient Details Name: Jamie Hodge MRN: [id number] DOB: July 24, 1940 Today's Date: 09/25/2019   History of Present Illness  Pt is a 79 y.o. female who has had some dizziness for a week or two and has had two episodes over the past week of right arm weakness with more intense vertigo. 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 No PMH on file.     Clinical Impression  Patient evaluated by Physical Therapy with no further acute PT needs identified. All education has been completed and the patient has no further questions. At the time of PT eval pt reports she is back to baseline of function. Pt denies any lingering dizziness or vertigo episodes. On examination, pt with 5/5 strength in bilateral UE's/LE's, no coordination deficits noted, and no sensation changes reported with light touch testing. Balance assessment via DGI - pt scored 22/24 indicating pt is a low risk for falls. Pt eager to return home with husband and feel she is safe to do so without follow-up therapy at d/c. See below for any follow-up Physical Therapy or equipment needs. PT is signing off. Thank you for this referral.     Follow Up Recommendations No PT follow up;Supervision - Intermittent    Equipment Recommendations  None recommended by PT    Recommendations for Other Services       Precautions / Restrictions Precautions Precautions: None Restrictions Weight Bearing Restrictions: No      Mobility  Bed Mobility               General bed mobility comments: Pt was received brushing teeth at the sink  Transfers Overall transfer level: Independent Equipment used: None                Ambulation/Gait Ambulation/Gait assistance: Modified independent (Device/Increase time) Gait Distance (Feet): 500 Feet Assistive device: None Gait Pattern/deviations: WFL(Within Functional Limits) Gait velocity: Decreased Gait velocity interpretation: 1.31 - 2.62  ft/sec, indicative of limited community ambulator General Gait Details: No unsteadiness or LOB noted. Pt pulling mask away from her face to breathe at times, but otherwise no DOE noted.   Stairs            Wheelchair Mobility    Modified Rankin (Stroke Patients Only) Modified Rankin (Stroke Patients Only) Pre-Morbid Rankin Score: No symptoms Modified Rankin: No symptoms     Balance Overall balance assessment: Modified Independent                               Standardized Balance Assessment Standardized Balance Assessment : Dynamic Gait Index   Dynamic Gait Index Level Surface: Normal Change in Gait Speed: Normal Gait with Horizontal Head Turns: Normal Gait with Vertical Head Turns: Normal Gait and Pivot Turn: Normal Step Over Obstacle: Mild Impairment Step Around Obstacles: Normal Steps: Mild Impairment Total Score: 22       Pertinent Vitals/Pain Pain Assessment: No/denies pain    Home Living Family/patient expects to be discharged to:: Private residence Living Arrangements: Spouse/significant other Available Help at Discharge: Family;Available 24 hours/day Type of Home: House Home Access: Stairs to enter Entrance Stairs-Rails: None Entrance Stairs-Number of Steps: 2 Home Layout: One level Home Equipment: Shower seat;Cane - single point;Walker - 2 wheels      Prior Function Level of Independence: Independent         Comments: Retired, Driving, independent with everything at [place]  Dominant Hand: Left    Extremity/Trunk Assessment   Upper Extremity Assessment Upper Extremity Assessment: Overall WFL for tasks assessed RUE Deficits / Details: 5/5 strength in grip, biceps, triceps, shoulder flexion bilaterally RUE Sensation: WNL RUE Coordination: WNL LUE Sensation: WNL LUE Coordination: WNL    Lower Extremity Assessment Lower Extremity Assessment: Overall WFL for tasks assessed RLE Deficits / Details: 5/5  strength in quads, hamstrings, hip flexors, ankle dorsiflexors bilaterally RLE Sensation: WNL RLE Coordination: WNL LLE Sensation: WNL LLE Coordination: WNL    Cervical / Trunk Assessment Cervical / Trunk Assessment: Normal  Communication   Communication: No difficulties  Cognition Arousal/Alertness: Awake/alert Behavior During Therapy: WFL for tasks assessed/performed Overall Cognitive Status: Within Functional Limits for tasks assessed                                 General Comments: A/O x4, following multi-step commands without difficulty.       General Comments      Exercises     Assessment/Plan    PT Assessment Patent does not need any further PT services  PT Problem List         PT Treatment Interventions      PT Goals (Current goals can be found in the Care Plan section)  Acute Rehab PT Goals Patient Stated Goal: Home today PT Goal Formulation: All assessment and education complete, DC therapy    Frequency     Barriers to discharge        Co-evaluation               AM-PAC PT "6 Clicks" Mobility  Outcome Measure Help needed turning from your back to your side while in a flat bed without using bedrails?: None Help needed moving from lying on your back to sitting on the side of a flat bed without using bedrails?: None Help needed moving to and from a bed to a chair (including a wheelchair)?: None Help needed standing up from a chair using your arms (e.g., wheelchair or bedside chair)?: None Help needed to walk in hospital room?: None Help needed climbing 3-5 steps with a railing? : None 6 Click Score: 24    End of Session Equipment Utilized During Treatment: Gait belt (For DGI tasks) Activity Tolerance: Patient tolerated treatment well Patient left: in chair;with call bell/phone within reach Nurse Communication: Mobility status PT Visit Diagnosis: Other symptoms and signs involving the nervous system (R29.898)    Time:  8315-1761 PT Time Calculation (min) (ACUTE ONLY): 21 min   Charges:   PT Evaluation $PT Eval Low Complexity: 1 Low          Rolinda Roan, PT, DPT Acute Rehabilitation Services Pager: [phone] Office: [phone]   Thelma Comp 09/25/2019, 10:23 AM

## 2019-09-25 NOTE — Discharge Summary (Signed)
Discharge Summary  Jamie Hodge YWV:[id number] DOB: 1940-04-03  PCP: Patient, No Pcp Per  Admit date: 09/23/2019 Discharge date: 09/25/2019  Time spent: 30 mins  Recommendations for Outpatient Follow-up:  1. Patient advised to establish care with primary care 2. Cardiology follow-up 3. Neurology follow-up  Discharge Diagnoses:  Active Hospital Problems   Diagnosis Date Noted  . Stroke [facility]) 09/25/2019    Resolved Hospital Problems  No resolved problems to display.    Discharge Condition: Stable  Diet recommendation: Heart healthy/moderate carb  Vitals:   09/25/19 1203 09/25/19 1546  BP: (!) 153/77 140/62  Pulse: 87 83  Resp: 18 18  Temp: 97.6 F (36.4 C) 98.1 F (36.7 C)  SpO2: 97% 97%    History of present illness:  Jamie Hodge is a 79 y.o. female with no significant medical history presented to [facility] complaining of dizziness, intermittent episodes of vertigo and right arm weakness (dissociation as patient described).  Patient reported first episode occurred at about 6 days ago while at rest, noted some dizziness, felt like her right arm was weak, lasted for a few minutes and resolved.  EMS was called, but patient refused to go to the hospital at that time.  Patient reported not experiencing similar symptoms until on 09/22/2019 while watching TV, had an episode of vertigo, with associated numbness and weakness in her right arm, lasted for a few minutes and completely resolved.  Patient decided to come to the ER on 09/23/2019 for further evaluation.  Patient denies any past medical history, denies any history of TIAs or strokes in the past, patient does not smoke, drink alcohol or use any illicit drug.  Due to unavailability of MRI at [facility], patient was transferred to [facility] for further TIA work-up including MRI.  EDP spoke to neurology Dr. Leonel Ramsay, who recommended transfer for further evaluation.In the ED, continue to remain  asymptomatic, vital signs stable except for uncontrolled BP.  Labs fairly unremarkable.  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  Patient admitted for further management.    Today, patient denied any new complaint, advised patient to establish care with a PCP, follow-up appointments with neurology and cardiology.   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 Regional wall motion abnormality Cardiology consulted, recommend outpatient ischemic work-up, no further recommendation Neurology consulted, outpatient follow-up recommended Discharge patient on aspirin and Plavix for 3 weeks, aspirin alone, Lipitor SLP/PT/OT -no further follow-up Cardiology and neurology follow-up  Possible hypertension BP uncontrolled-permissive hypertension Follow-up with PCP/cardiology  Leukocytosis Afebrile, no signs of infection, completely asymptomatic Follow-up with PCP  Obesity Lifestyle modification advised        Malnutrition Type:      Malnutrition Characteristics:      Nutrition Interventions:      Estimated body mass index is 31.76 kg/m as calculated from the following:  Height as of this encounter: 5\' 4"  (1.626 m).   Weight as of this encounter: 83.9 kg.     Procedures: None  Consultations:  Cardiology  Neurology  Discharge Exam: BP 140/62 (BP Location: Left Arm)   Pulse 83   Temp 98.1 F (36.7 C) (Oral)   Resp 18   Ht 5\' 4"  (1.626 m)   Wt 83.9 kg   SpO2 97%   BMI 31.76 kg/m   General: NAD Cardiovascular: S1, S2 present Respiratory: CTA B Neurology: No obvious focal neurologic deficit noted, normal strength in all extremities, sensation intact    Discharge Instructions You were cared for by a hospitalist during your hospital stay. If you have any questions about your discharge medications or the care you received while you were in the hospital after you are discharged, you can call the unit and asked to speak with the hospitalist on call if the hospitalist that took care of you is not available. Once you are discharged, your primary care physician will handle any further medical issues. Please note that NO REFILLS for any discharge medications will be authorized once you are discharged, as it is imperative that you return to your primary care physician (or establish a relationship with a primary care physician if you do not have one) for your aftercare needs so that they can reassess your need for medications and monitor your lab values.  Discharge Instructions    Ambulatory referral to Neurology   Complete by: As directed    Follow up with stroke clinic NP (Jessica Vanschaick or Cecille Rubin, if both not available, consider Zachery Dauer, or [place]) at [facility] in about 4 weeks. Thanks.   Diet - low sodium heart healthy   Complete by: As directed    Increase activity slowly   Complete by: As directed      Allergies as of 09/25/2019      Reactions   Mango Flavor Anaphylaxis   Contrast Media [iodinated Diagnostic Agents] Other (See Comments)   Reports decline in kidney function "years ago"      Medication List    TAKE these medications   aspirin 81 MG EC tablet Take 1 tablet (81 mg total) by mouth daily. Swallow  whole. Start taking on: September 26, 2019   atorvastatin 80 MG tablet Commonly known as: LIPITOR Take 1 tablet (80 mg total) by mouth daily. Start taking on: September 26, 2019   clopidogrel 75 MG tablet Commonly known as: PLAVIX Take 1 tablet (75 mg total) by mouth daily for 20 days. Start taking on: September 26, 2019   MULTIVITAMIN ADULT PO Take 1 tablet by mouth daily.      Allergies  Allergen Reactions  . Mango Flavor Anaphylaxis  . Contrast Media [Iodinated Diagnostic Agents] Other (See Comments)    Reports decline in kidney function "years ago"    Follow-up Information    [facility]. Schedule an appointment as soon as possible for a visit in 4 week(s).   Specialty: Neurology Contact information: [address] [place] [phone]       Elouise Munroe, MD Follow up.   Specialties: Cardiology, Radiology Why: Office will call patient for an appointment Contact information: [address] [place] [address] [phone]                The results of significant diagnostics from this hospitalization (including imaging, microbiology, ancillary and laboratory) are listed below for reference.  Significant Diagnostic Studies: CT Angio Head W or Wo Contrast  Result Date: 09/24/2019 CLINICAL DATA:  Dizziness with history of vertigo. EXAM: CT ANGIOGRAPHY HEAD AND NECK TECHNIQUE: Multidetector CT imaging of the head and neck was performed using the standard protocol during bolus administration of intravenous contrast. Multiplanar CT image reconstructions and MIPs were obtained to evaluate the vascular anatomy. Carotid stenosis measurements (when applicable) are obtained utilizing NASCET criteria, using the distal internal carotid diameter as the denominator. CONTRAST:  39mL OMNIPAQUE IOHEXOL 350 MG/ML SOLN COMPARISON:  None. FINDINGS: CT HEAD FINDINGS Brain: There is no mass, hemorrhage or  extra-axial collection. The size and configuration of the ventricles and extra-axial CSF spaces are normal. There is no acute or chronic infarction. The brain parenchyma is normal. Skull: The visualized skull base, calvarium and extracranial soft tissues are normal. Sinuses/Orbits: No fluid levels or advanced mucosal thickening of the visualized paranasal sinuses. No mastoid or middle ear effusion. The orbits are normal. CTA NECK FINDINGS SKELETON: There is no bony spinal canal stenosis. No lytic or blastic lesion. OTHER NECK: Normal pharynx, larynx and major salivary glands. No cervical lymphadenopathy. Unremarkable thyroid gland. UPPER CHEST: No pneumothorax or pleural effusion. No nodules or masses. AORTIC ARCH: There is mild calcific atherosclerosis of the aortic arch. There is no aneurysm, dissection or hemodynamically significant stenosis of the visualized portion of the aorta. Conventional 3 vessel aortic branching pattern. The visualized proximal subclavian arteries are widely patent. RIGHT CAROTID SYSTEM: No dissection, occlusion or aneurysm. Mild atherosclerotic calcification at the carotid bifurcation without hemodynamically significant stenosis. LEFT CAROTID SYSTEM: Normal without aneurysm, dissection or stenosis. VERTEBRAL ARTERIES: Left dominant configuration. Both origins are clearly patent. There is no dissection, occlusion or flow-limiting stenosis to the skull base (V1-V3 segments). CTA HEAD FINDINGS POSTERIOR CIRCULATION: --Vertebral arteries: Normal V4 segments. --Inferior cerebellar arteries: Normal. --Basilar artery: Normal. --Superior cerebellar arteries: Normal. --Posterior cerebral arteries (PCA): Normal. 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 Moderate stenosis of the left clinoid segment. --Anterior cerebral arteries (ACA): Normal. Both A1 segments are present. Patent anterior communicating artery (a-comm).  --Middle cerebral arteries (MCA): Normal. VENOUS SINUSES: As permitted by contrast timing, patent. ANATOMIC VARIANTS: None Review of the MIP images confirms the above findings. IMPRESSION: 1. No emergent large vessel occlusion. 2. 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 Aortic Atherosclerosis (ICD10-I70.0). Electronically Signed   By: Ulyses Jarred M.D.   On: 09/24/2019 03:00   CT Head Wo Contrast  Result Date: 09/23/2019 CLINICAL DATA:  Transient ischemic attack, vertigo, right arm weakness EXAM: CT HEAD WITHOUT CONTRAST TECHNIQUE: Contiguous axial images were obtained from the base of the skull through the vertex without intravenous contrast. COMPARISON:  None. FINDINGS: Brain: Normal anatomic configuration. No abnormal intra or extra-axial mass lesion or fluid collection. No abnormal mass effect or midline shift. No evidence of acute intracranial hemorrhage or infarct. Ventricular size is normal. Cerebellum unremarkable. Vascular: Unremarkable Skull: Intact Sinuses/Orbits: Paranasal sinuses are clear. Orbits are unremarkable. Other: Mastoid air cells and middle ear cavities are clear. IMPRESSION: No evidence of acute intracranial abnormality. Electronically Signed   By: Fidela Salisbury MD   On: 09/23/2019 20:35   CT Angio Neck W and/or Wo Contrast  Result Date: 09/24/2019 CLINICAL DATA:  Dizziness with history of vertigo. EXAM: CT ANGIOGRAPHY HEAD AND NECK TECHNIQUE: Multidetector CT imaging of the head  and neck was performed using the standard protocol during bolus administration of intravenous contrast. Multiplanar CT image reconstructions and MIPs were obtained to evaluate the vascular anatomy. Carotid stenosis measurements (when applicable) are obtained utilizing NASCET criteria, using the distal internal carotid diameter as the denominator. CONTRAST:  67mL OMNIPAQUE IOHEXOL 350 MG/ML  SOLN COMPARISON:  None. FINDINGS: CT HEAD FINDINGS Brain: There is no mass, hemorrhage or extra-axial collection. The size and configuration of the ventricles and extra-axial CSF spaces are normal. There is no acute or chronic infarction. The brain parenchyma is normal. Skull: The visualized skull base, calvarium and extracranial soft tissues are normal. Sinuses/Orbits: No fluid levels or advanced mucosal thickening of the visualized paranasal sinuses. No mastoid or middle ear effusion. The orbits are normal. CTA NECK FINDINGS SKELETON: There is no bony spinal canal stenosis. No lytic or blastic lesion. OTHER NECK: Normal pharynx, larynx and major salivary glands. No cervical lymphadenopathy. Unremarkable thyroid gland. UPPER CHEST: No pneumothorax or pleural effusion. No nodules or masses. AORTIC ARCH: There is mild calcific atherosclerosis of the aortic arch. There is no aneurysm, dissection or hemodynamically significant stenosis of the visualized portion of the aorta. Conventional 3 vessel aortic branching pattern. The visualized proximal subclavian arteries are widely patent. RIGHT CAROTID SYSTEM: No dissection, occlusion or aneurysm. Mild atherosclerotic calcification at the carotid bifurcation without hemodynamically significant stenosis. LEFT CAROTID SYSTEM: Normal without aneurysm, dissection or stenosis. VERTEBRAL ARTERIES: Left dominant configuration. Both origins are clearly patent. There is no dissection, occlusion or flow-limiting stenosis to the skull base (V1-V3 segments). CTA HEAD FINDINGS POSTERIOR CIRCULATION: --Vertebral arteries: Normal V4 segments. --Inferior cerebellar arteries: Normal. --Basilar artery: Normal. --Superior cerebellar arteries: Normal. --Posterior cerebral arteries (PCA): Normal. 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 Moderate stenosis of the left clinoid segment. --Anterior cerebral arteries  (ACA): Normal. Both A1 segments are present. Patent anterior communicating artery (a-comm). --Middle cerebral arteries (MCA): Normal. VENOUS SINUSES: As permitted by contrast timing, patent. ANATOMIC VARIANTS: None Review of the MIP images confirms the above findings. IMPRESSION: 1. No emergent large vessel occlusion. 2. 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 Aortic Atherosclerosis (ICD10-I70.0). Electronically Signed   By: Ulyses Jarred M.D.   On: 09/24/2019 03:00   MR BRAIN WO CONTRAST  Result Date: 09/24/2019 CLINICAL DATA:  TIA.  Intermittent dizziness and right arm weakness. EXAM: MRI HEAD WITHOUT CONTRAST TECHNIQUE: Multiplanar, multiecho pulse sequences of the brain and surrounding structures were obtained without intravenous contrast. COMPARISON:  Head CT 09/23/2019 FINDINGS: Brain: There is a 7 mm focus of hyperintense trace diffusion weighted signal deep in the left cerebellar hemisphere with associated T2/FLAIR hyperintensity and heterogeneous ADC (including a possibly mildly reduced component) suggesting a subacute infarct. No intracranial hemorrhage, midline shift, or extra-axial fluid collection is identified. There is a 1 cm extra-axial focus of dense calcification to the right of the falx in the high posterior frontal region near the vertex without associated mass effect on the adjacent brain or edema. Small foci of T2 hyperintensity in cerebral white matter bilaterally are nonspecific but compatible with mild chronic small vessel ischemic disease. Mild cerebral atrophy is within normal limits for age. Vascular: Major intracranial vascular flow voids are preserved. Skull and upper cervical spine: Unremarkable bone marrow signal. Sinuses/Orbits: Unremarkable orbits. Paranasal sinuses and mastoid air cells are clear. Other: None. IMPRESSION: 1. Small subacute  left cerebellar  infarct. 2. Mild chronic small vessel ischemic disease. 3. 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 Electronically Signed   By: Logan Bores M.D.   On: 09/24/2019 21:38   ECHOCARDIOGRAM COMPLETE  Result Date: 09/25/2019    ECHOCARDIOGRAM REPORT   Patient Name:   Jamie Hodge Date of Exam: 09/25/2019 Medical Rec #:  [id number]         Height:       64.0 in Accession #:    [id number]        Weight:       185.0 lb Date of Birth:  [birth year]         BSA:          1.893 m Patient Age:    72 years          BP:           144/66 mmHg Patient Gender: F                 HR:           76 bpm. Exam Location:  Inpatient Procedure: 2D Echo, Cardiac Doppler and Color Doppler Indications:    TIA  History:        Patient has no prior history of Echocardiogram examinations.  Sonographer:    Clayton Lefort RDCS (AE) Referring Phys: [id number] [place]  Sonographer Comments: Suboptimal parasternal window. Image acquisition challenging due to respiratory motion. IMPRESSIONS  1. Left ventricular ejection fraction, by estimation, is 65 to 70%. The left ventricle has normal function. The left ventricle demonstrates regional wall motion abnormalities (see scoring diagram/findings for description). Left ventricular diastolic parameters are consistent with Grade I diastolic dysfunction (impaired relaxation). There is severe akinesis of the left ventricular, apical apical segment.  2. Right ventricular systolic function is normal. The right ventricular size is normal.  3. The mitral valve is grossly normal. No evidence of mitral valve regurgitation. No evidence of mitral stenosis.  4. The aortic valve is grossly normal. Aortic valve regurgitation is not visualized. No aortic stenosis is present. FINDINGS  Left Ventricle: Left ventricular ejection fraction, by estimation, is 65 to 70%. The left ventricle has normal function. The left ventricle demonstrates  regional wall motion abnormalities. Severe akinesis of the left ventricular, apical apical segment. The left ventricular internal cavity size was small. There is no left ventricular hypertrophy. Left ventricular diastolic parameters are consistent with Grade I diastolic dysfunction (impaired relaxation). Right Ventricle: The right ventricular size is normal. No increase in right ventricular wall thickness. Right ventricular systolic function is normal. Left Atrium: Left atrial size was normal in size. Right Atrium: Right atrial size was normal in size. Pericardium: There is no evidence of pericardial effusion. Mitral Valve: The mitral valve is grossly normal. No evidence of mitral valve regurgitation. No evidence of mitral valve stenosis. MV peak gradient, 5.8 mmHg. The mean mitral valve gradient is 2.0 mmHg. Tricuspid Valve: The tricuspid valve is not assessed. Tricuspid valve regurgitation is not demonstrated. Aortic Valve: The aortic valve is grossly normal. Aortic valve regurgitation is not visualized. No aortic stenosis is present. Aortic valve mean gradient measures 4.0 mmHg. Aortic valve peak gradient measures 6.2 mmHg. Aortic valve area, by VTI measures 2.19 cm. Pulmonic Valve: The pulmonic valve was grossly normal. Pulmonic valve regurgitation is not visualized. Aorta: The aortic root and ascending aorta are structurally normal, with no evidence of dilitation. IAS/Shunts: The  atrial septum is grossly normal.  LEFT VENTRICLE PLAX 2D LVIDd:         4.30 cm  Diastology LVIDs:         2.90 cm  LV e' lateral:   5.68 cm/s LV PW:         1.20 cm  LV E/e' lateral: 10.2 LV IVS:        1.20 cm  LV e' medial:    4.30 cm/s LVOT diam:     2.00 cm  LV E/e' medial:  13.5 LV SV:         54 LV SV Index:   29 LVOT Area:     3.14 cm  RIGHT VENTRICLE            IVC RV Basal diam:  2.40 cm    IVC diam: 1.40 cm RV S prime:     9.00 cm/s TAPSE (M-mode): 1.7 cm LEFT ATRIUM             Index       RIGHT ATRIUM          Index LA  diam:        3.40 cm 1.80 cm/m  RA Area:     9.62 cm LA Vol (A2C):   42.1 ml 22.24 ml/m RA Volume:   17.30 ml 9.14 ml/m LA Vol (A4C):   33.7 ml 17.81 ml/m LA Biplane Vol: 38.8 ml 20.50 ml/m  AORTIC VALVE AV Area (Vmax):    2.45 cm AV Area (Vmean):   2.34 cm AV Area (VTI):     2.19 cm AV Vmax:           125.00 cm/s AV Vmean:          89.400 cm/s AV VTI:            0.248 m AV Peak Grad:      6.2 mmHg AV Mean Grad:      4.0 mmHg LVOT Vmax:         97.60 cm/s LVOT Vmean:        66.700 cm/s LVOT VTI:          0.173 m LVOT/AV VTI ratio: 0.70  AORTA Ao Root diam: 3.20 cm Ao Asc diam:  3.00 cm MITRAL VALVE MV Area (PHT): 3.02 cm     SHUNTS MV Peak grad:  5.8 mmHg     Systemic VTI:  0.17 m MV Mean grad:  2.0 mmHg     Systemic Diam: 2.00 cm MV Vmax:       1.20 m/s MV Vmean:      68.1 cm/s MV Decel Time: 251 msec MV E velocity: 58.00 cm/s MV A velocity: 102.00 cm/s MV E/A ratio:  0.57 Mertie Moores MD Electronically signed by Mertie Moores MD Signature Date/Time: 09/25/2019/12:51:51 PM    Final    ECHOCARDIOGRAM LIMITED  Result Date: 09/25/2019    ECHOCARDIOGRAM LIMITED REPORT   Patient Name:   Jamie Hodge Date of Exam: 09/25/2019 Medical Rec #:  [id number]         Height:       64.0 in Accession #:    [id number]        Weight:       185.0 lb Date of Birth:  May 29, 1940         BSA:          1.893 m Patient Age:    20 years          BP:  153/77 mmHg Patient Gender: F                 HR:           87 bpm. Exam Location:  Inpatient Procedure: 2D Echo and Intracardiac Opacification Agent Indications:    Stroke  History:        Patient has prior history of Echocardiogram examinations, most                 recent 09/25/2019.  Sonographer:    Clayton Lefort RDCS (AE) Referring Phys: [id number] GAYATRI A [place]  1. Left ventricular ejection fraction, by estimation, is 55 to 60%. The left ventricle has normal function. The left ventricle demonstrates regional wall motion abnormalities (see scoring  diagram/findings for description).  2. 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  3. No LV apical thrombus. FINDINGS  Left Ventricle: Left ventricular ejection fraction, by estimation, is 55 to 60%. The left ventricle has normal function. The left ventricle demonstrates regional wall motion abnormalities. Definity contrast agent was given IV to delineate the left ventricular endocardial borders.  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 Cherlynn Kaiser MD Electronically signed by Cherlynn Kaiser MD Signature Date/Time: 09/25/2019/2:46:52 PM    Final     Microbiology: Recent Results (from the past 240 hour(s))  SARS Coronavirus 2 by RT PCR (hospital order, performed in [facility] hospital lab) Nasopharyngeal Nasopharyngeal Swab     Status: None   Collection Time: 09/23/19 11:00 PM   Specimen: Nasopharyngeal Swab  Result Value Ref Range Status   SARS Coronavirus 2 NEGATIVE NEGATIVE Final    Comment: (NOTE) SARS-CoV-2 target nucleic acids are NOT DETECTED.  The SARS-CoV-2 RNA is generally detectable in upper and lower respiratory specimens during the acute phase of infection. The lowest concentration of SARS-CoV-2 viral copies this assay can detect is 250 copies / mL. A negative result does not preclude SARS-CoV-2 infection and should not be used as the sole basis for treatment or other patient management decisions.  A negative result may occur with improper specimen collection / handling, submission of specimen other than nasopharyngeal swab, presence of viral mutation(s) within the areas targeted by this assay, and inadequate number of viral copies (<250 copies / mL). A negative result must be combined with clinical observations, patient history, and epidemiological information.  Fact Sheet for Patients:   StrictlyIdeas.no  Fact Sheet for Healthcare  Providers: BankingDealers.co.za  This test is not yet approved or  cleared by the [place] FDA and has been authorized for detection and/or diagnosis of SARS-CoV-2 by FDA under an Emergency Use Authorization (EUA).  This EUA will remain in effect (meaning this test can be used) for the duration of the COVID-19 declaration under Section 564(b)(1) of the Act, 21 U.S.C. section 360bbb-3(b)(1), unless the authorization is terminated or revoked sooner.  Performed at [facility], [address] [place], [place] [address]      Labs: Basic Metabolic Panel: Recent Labs  Lab 09/23/19 1420 09/25/19 0302  NA 136 138  K 4.0 3.3*  CL 103 103  CO2 23 24  GLUCOSE 109* 119*  BUN 14 15  CREATININE 0.65 0.83  CALCIUM 9.3 9.2   Liver Function Tests: No results for input(s): AST, ALT, ALKPHOS, BILITOT, PROT, ALBUMIN in the last 168 hours. No results for input(s): LIPASE, AMYLASE in the last 168 hours. No results for input(s): AMMONIA in  the last 168 hours. CBC: Recent Labs  Lab 09/23/19 1420 09/25/19 0302  WBC 8.9 12.5*  NEUTROABS  --  8.9*  HGB 14.6 13.9  HCT 43.4 42.7  MCV 87.9 88.0  PLT 267 274   Cardiac Enzymes: No results for input(s): CKTOTAL, CKMB, CKMBINDEX, TROPONINI in the last 168 hours. BNP: BNP (last 3 results) No results for input(s): BNP in the last 8760 hours.  ProBNP (last 3 results) No results for input(s): PROBNP in the last 8760 hours.  CBG: No results for input(s): GLUCAP in the last 168 hours.     Signed:  Alma Friendly, MD [facility] 09/25/2019, 6:25 PM

## 2019-09-28 ENCOUNTER — Other Ambulatory Visit (HOSPITAL_COMMUNITY): Payer: Self-pay | Admitting: Emergency Medicine

## 2019-09-28 MED ORDER — METOPROLOL TARTRATE 50 MG PO TABS: 50.0000 mg | ORAL_TABLET | Freq: Once | ORAL | 0 refills | Status: DC

## 2019-10-28 ENCOUNTER — Encounter: Payer: Self-pay | Admitting: Adult Health

## 2019-10-28 ENCOUNTER — Ambulatory Visit: Payer: Medicare Other | Admitting: Adult Health

## 2019-10-28 ENCOUNTER — Other Ambulatory Visit: Payer: Self-pay

## 2019-10-28 VITALS — BP 154/79 | HR 91 | Ht 64.0 in | Wt 188.0 lb

## 2019-10-28 DIAGNOSIS — I639 Cerebral infarction, unspecified: Secondary | ICD-10-CM | POA: Diagnosis not present

## 2019-10-28 DIAGNOSIS — E785 Hyperlipidemia, unspecified: Secondary | ICD-10-CM

## 2019-10-28 DIAGNOSIS — F0630 Mood disorder due to known physiological condition, unspecified: Secondary | ICD-10-CM | POA: Diagnosis not present

## 2019-10-28 MED ORDER — ATORVASTATIN CALCIUM 80 MG PO TABS
80.0000 mg | ORAL_TABLET | Freq: Every day | ORAL | 3 refills | Status: DC
Start: 2019-10-28 — End: 2020-10-02

## 2019-10-28 MED ORDER — SERTRALINE HCL 25 MG PO TABS: 25.0000 mg | ORAL_TABLET | Freq: Every day | ORAL | 3 refills | Status: DC

## 2019-10-28 NOTE — Patient Instructions (Addendum)
Continue aspirin 81 mg daily  and restart atorvastatin 80mg  daily  for secondary stroke prevention  Start Zoloft 25mg  nightly for anxiety symptoms  Continue to follow up with PCP regarding cholesterol and blood pressure management  Maintain strict control of hypertension with blood pressure goal below 130/90 and cholesterol with LDL cholesterol (bad cholesterol) goal below 70 mg/dL.      Followup in the future with me in 3 months or call earlier if needed       Thank you for coming to see [place] at [facility]. I hope we have been able to provide you high quality care today.  You may receive a patient satisfaction survey over the next few weeks. We would appreciate your feedback and comments so that we may continue to improve ourselves and the health of our patients.     Emotional Health After Stroke Your emotional health may change after a stroke. You may have fear, anxiety, anger, sadness, and other feelings. Some of these changes happen because a stroke can damage your brain and nervous system. You may also have these feelings because coping with a change in your health can feel overwhelming. Depression and other emotional changes can slow your recovery after a stroke. It is important to recognize the symptoms so that you can take steps to strengthen your emotional health. What are some common emotions after a stroke? You may have:  Fear.  Anxiety.  Anger.  Frustration.  Sadness.  Feelings of loss or grief.  Depression.  Crying or laughing at the wrong time or wrong situation (pseudobulbar affect, orPBA). What are the symptoms of depression? Depression after a stroke can happen right away, or it can show up later. Symptoms of depression may include:  Sleep problems.  Changes in normal eating habits, such as eating too much or too little.  Weight gain or weight loss.  Not having energy or enthusiasm (lethargy).  Feeling very tired  (fatigue).  Avoiding people and activities (social withdrawal).  Irritability.  Crying more than usual.  Mood swings.  Not being able to concentrate.  Feeling hopeless.  Hating yourself.  Having suicidal thoughts. If you ever feel like you may hurt yourself or others, or have thoughts about taking your own life, get help right away. You can go to your nearest emergency department or call:  Your local emergency services (911 in the [place]  A suicide crisis helpline, such as the [place] at 940-183-4435. This is open 24 hours a day. What increases my risk of depression? Having a stroke raises the risk of depression. The risk also goes up if you:  Are socially isolated.  Have a family history of depression.  Have a history of depression or other mental health problems before the stroke.  Are unable to work or do activities that you previously enjoyed.  Need help from others for daily activities.  Use drugs or drink alcohol.  Take certain medicines, such as sleeping pills or high blood pressure medicines. What are some coping methods I can use? Ask your health care provider for help. Your health care provider may recommend treatments for depression, such as:  Talk therapy or counseling with a mental health professional. This may include cognitive behavioral therapy (CBT) to help change your patterns of thinking.  Medical therapies, such as brain stimulation or light therapy.  Lifestyle changes, such as eating a healthy diet and avoiding alcohol.  Antidepressant medicines.  Alternative therapies, such as acupuncture, music therapy, or  pet therapy. Other coping strategies you may try include:  Physical therapy or exercises. Try to do some exercise every day.  Writing your thoughts in a journal. An example might be keeping track of unrealistic thoughts or keeping a log of things you are thankful for.  Practicing good sleep habits, such as  getting up at the same time every day.  Following a predictable routine each day.  Participating in activities that make you laugh.  Mindfulness therapy. This may include meditation and other techniques to lower your stress.  Joining a support group for people who are recovering from a stroke. These groups provide social interaction and help you feel connected to others. Your health care team can help you find a support group in your area. Summary  Your emotional health may change after a stroke. You may have fear, anxiety, anger, sadness, and other feelings.  It is important to recognize the symptoms of depression and other emotional problems.  If you experience emotional changes, let your loved ones know and contact your health care provider. This information is not intended to replace advice given to you by your health care provider. Make sure you discuss any questions you have with your health care provider. Document Revised: 12/20/2016 Document Reviewed: 04/12/2016 Elsevier Patient Education  [place].

## 2019-10-28 NOTE — Progress Notes (Signed)
[facility] [address] [place]. [place] [address] [phone]       HOSPITAL FOLLOW UP NOTE  [name] Date of Birth:  12/17/1940 Medical Record Number:  [id number]   Reason for Referral:  hospital stroke follow up    SUBJECTIVE:   CHIEF COMPLAINT:  Chief Complaint  Patient presents with  . Follow-up    rm 9  . Cerebrovascular Accident    pt has right leg numbness and dizziness. Pt said she still gets headaches and has anxiety    HPI:   [name] is a 79 y.o. female with history of vertigo  who presented on 09/23/2019 with dizziness and right arm weakness.  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  Recommended DAPT for 3 weeks then aspirin alone.  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  LDL 258 and initiate atorvastatin 80 mg daily.  No prior history of HTN or DM.  Other stroke risk factors include advanced age and obesity but no prior stroke history.  Evaluated by therapy and discharged home in stable condition without therapy needs.  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   CT head - No evidence of acute intracranial abnormality.  MRI head - Small subacute left cerebellar infarct. Mild chronic small vessel ischemic disease.   CTA H&N -  No emergent large vessel occlusion. 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  Patient counseled to be compliant with her antithrombotic medications  Ongoing aggressive stroke risk factor management  Therapy recommendations:  None  Disposition: Home today  Today, 10/28/2019, Ms. [name] is being seen for hospital follow up. She reports since discharge she has been experiencing excessive anxiety, headache, intermittent right leg numbness, vertigo and tinnitus bilaterally. No prior history of anxiety or depression. Chronic history of headaches but since stroke, has been experiencing generalized headaches more frequently.  Reports intermittent right leg numbness is not new since hospitalization.  Tinnitus and vertigo chronic condition.  She believes onset of vertigo and headache typically associated with increased anxiety.  Plans to establish care with PCP next week. Has scheduled visit with cardiology in 1.5 weeks.  Completed 3 weeks DAPT and remains on aspirin without bleeding or bruising.  Previously on atorvastatin 80 mg daily without myalgias but completed 30-day prescription provided at discharge and does not have PCP at this time for refill.  Blood pressure today 154/79.  She does not routinely monitor at home.  No further concerns at this time.      ROS:   14 system review of systems performed and negative with exception of those listed in HPI  PMH:  Past Medical History:  Diagnosis Date  . Herpes zoster 2012  . Vertigo     PSH:  Past Surgical History:  Procedure Laterality Date  . RHINOPLASTY      Social History:  Social History  Socioeconomic History  . Marital status: Married    Spouse name: Not on file  . Number of children: Not on file  . Years of education: Not on file  . Highest education level: Not on file  Occupational History  . Not on file  Tobacco Use  . Smoking status: Never Smoker  . Smokeless tobacco: Never Used  Substance and Sexual Activity  . Alcohol use: No  . Drug use: No  . Sexual activity: Never     Birth control/protection: None  Other Topics Concern  . Not on file  Social History Narrative  . Not on file   Social Determinants of Health   Financial Resource Strain:   . Difficulty of Paying Living Expenses: Not on file  Food Insecurity:   . Worried About Charity fundraiser in the Last Year: Not on file  . Ran Out of Food in the Last Year: Not on file  Transportation Needs:   . Lack of Transportation (Medical): Not on file  . Lack of Transportation (Non-Medical): Not on file  Physical Activity:   . Days of Exercise per Week: Not on file  . Minutes of Exercise per Session: Not on file  Stress:   . Feeling of Stress : Not on file  Social Connections:   . Frequency of Communication with Friends and Family: Not on file  . Frequency of Social Gatherings with Friends and Family: Not on file  . Attends Religious Services: Not on file  . Active Member of Clubs or Organizations: Not on file  . Attends Archivist Meetings: Not on file  . Marital Status: Not on file  Intimate Partner Violence:   . Fear of Current or Ex-Partner: Not on file  . Emotionally Abused: Not on file  . Physically Abused: Not on file  . Sexually Abused: Not on file    Family History:  Family History  Problem Relation Age of Onset  . Unexplained death Mother        Died at the age of 68, no autopsy performed  . CAD Father 21  . Lung cancer Father 57    Medications:   Current Outpatient Medications on File Prior to Visit  Medication Sig Dispense Refill  . aspirin EC 81 MG tablet Take 81 mg by mouth daily. Swallow whole.    . Multiple Vitamin (MULTIVITAMIN ADULT PO) Take 1 tablet by mouth daily.    . metoprolol tartrate (LOPRESSOR) 50 MG tablet Take 1 tablet (50 mg total) by mouth once for 1 dose. Take one tablet (50mg ) 2 hr prior to CT scan 1 tablet 0   No current facility-administered medications on file prior to visit.    Allergies:   Allergies  Allergen Reactions  . Mango Flavor  Anaphylaxis  . Contrast Media [Iodinated Diagnostic Agents] Other (See Comments)    Reports decline in kidney function "years ago"      OBJECTIVE:  Physical Exam  Vitals:   10/28/19 1400  BP: (!) 154/79  Pulse: 91  Weight: 188 lb (85.3 kg)  Height: 5\' 4"  (1.626 m)   Body mass index is 32.27 kg/m. No exam data present  Post stroke PHQ 2/9 Depression screen PHQ 2/9 10/28/2019  Decreased Interest 0  Down, Depressed, Hopeless 0  PHQ - 2 Score 0    Post stroke GAD-7 GAD 7 : Generalized Anxiety Score 10/28/2019  Nervous, Anxious, on Edge 3  Control/stop worrying 2  Worry too much - different things 3  Trouble relaxing 2  Restless 0  Easily annoyed or irritable 0  Afraid - awful might happen 2  Total GAD 7 Score 12  Anxiety Difficulty Very difficult    General: well developed, well nourished,  very pleasant elderly Caucasian female, seated, in no evident distress Head: head normocephalic and atraumatic.   Neck: supple with no carotid or supraclavicular bruits Cardiovascular: regular rate and rhythm, no murmurs Musculoskeletal: no deformity Skin:  no rash/petichiae Vascular:  Normal pulses all extremities   Neurologic Exam Mental Status: Awake and fully alert.   Fluent speech and language.  Oriented to place and time. Recent and remote memory intact. Attention span, concentration and fund of knowledge appropriate. Mood and affect appropriate.  Cranial Nerves: Fundoscopic exam reveals sharp disc margins. Pupils equal, briskly reactive to light. Extraocular movements full without nystagmus. Visual fields full to confrontation. Hearing intact. Facial sensation intact. Face, tongue, palate moves normally and symmetrically.  Motor: Normal bulk and tone. Normal strength in all tested extremity muscles. Sensory.: intact to touch , pinprick , position and vibratory sensation.  Coordination: Rapid alternating movements normal in all extremities. Finger-to-nose and heel-to-shin  performed accurately bilaterally. Gait and Station: Arises from chair without difficulty. Stance is normal. Gait demonstrates normal stride length and balance without use of assistive device Reflexes: 1+ and symmetric. Toes downgoing.     NIHSS  0 Modified Rankin  1      ASSESSMENT: Jamie Hodge is a 79 y.o. year old female presented with dizziness and right arm weakness on 09/23/2019 with stroke work-up revealing small subacute left cerebellar infarct secondary to small vessel disease. 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      PLAN:  1. Left cerebellar stroke:  a. Residual deficit: Post stroke anxiety, intermittent generalized headache, intermittent left leg numbness and vertigo.  Onset of symptoms typically with increased anxiety which is currently on a daily basis.  No prior history of anxiety/depression.  Recommend initiating sertraline 25 mg nightly.  b. Continue aspirin 81 mg daily  and restart atorvastatin 80 mg daily for secondary stroke prevention.  c. Close PCP follow up for aggressive stroke risk factor management  2. HLD: LDL goal <70. Recent LDL 258.  Restart atorvastatin 80 mg daily. Will repeat lipid panel at follow-up visit if not completed since discharge at that time 3. Apical akinesis: Follow-up with cardiology as scheduled for further evaluation    Follow up in 3 months or call earlier if needed   I spent 45 minutes of face-to-face and non-face-to-face time with patient.  This included previsit chart review, lab review, study review, order entry, electronic health record documentation, patient education regarding recent stroke, residual deficits including anxiety and fluctuation of deficits, initiating SSRI with possible side effects, importance of managing stroke risk factors and answered all questions to patient satisfaction     Frann Rider, AGNP-BC  [facility] [address] [place], [place] [address]  Phone [phone] Fax [phone] Note: This document was prepared with digital dictation and possible smart phrase technology. Any transcriptional errors that result from this process are unintentional.

## 2019-10-30 NOTE — Progress Notes (Signed)
I agree with the above plan 

## 2019-11-01 ENCOUNTER — Telehealth: Payer: Self-pay | Admitting: Internal Medicine

## 2019-11-01 NOTE — Telephone Encounter (Signed)
Spoke with pt, she is due for her cardiac CT and she reports an allergy to contrast she needs pre-med for dye allergy. Will forward to rhonda for direction.

## 2019-11-01 NOTE — Telephone Encounter (Signed)
Patient has questions regarding appointment for CT on 11/08/2019

## 2019-11-03 MED ORDER — PREDNISONE 50 MG PO TABS: ORAL_TABLET | ORAL | 0 refills | Status: DC

## 2019-11-03 NOTE — Telephone Encounter (Signed)
Dr Margaretann Loveless saw in the hospital. Could you send in rx for the standard Radiology prep? I have never done this for an outpt study. Thanks

## 2019-11-03 NOTE — Addendum Note (Signed)
Addended by: Cristopher Estimable on: 11/03/2019 11:05 AM   Modules accepted: Orders

## 2019-11-03 NOTE — Telephone Encounter (Signed)
Spoke with pt, aware of prednisone 50 mg 13 hours, 7 hours and 1 hour prior to CT scan. She will also take benadryl 50 mg 1 hour prior. New script sent to the pharmacy. She is aware she will need a driver.

## 2019-11-04 ENCOUNTER — Other Ambulatory Visit (HOSPITAL_COMMUNITY): Payer: Self-pay | Admitting: *Deleted

## 2019-11-04 ENCOUNTER — Telehealth (HOSPITAL_COMMUNITY): Payer: Self-pay | Admitting: *Deleted

## 2019-11-04 DIAGNOSIS — R931 Abnormal findings on diagnostic imaging of heart and coronary circulation: Secondary | ICD-10-CM

## 2019-11-04 NOTE — Telephone Encounter (Signed)
Attempted to call patient regarding upcoming cardiac CT appointment. Left message on voicemail with name and callback number  Rosielee Corporan Tai RN Navigator Cardiac Imaging [facility] [phone] Office [phone] Cell  

## 2019-11-05 ENCOUNTER — Telehealth (HOSPITAL_COMMUNITY): Payer: Self-pay | Admitting: *Deleted

## 2019-11-05 NOTE — Telephone Encounter (Signed)
Second attempt to call patient regarding upcoming cardiac CT appointment. Left message on voicemail with name and callback number  [place] RN Navigator Cardiac [facility] [phone] Office [phone] Cell.

## 2019-11-05 NOTE — Telephone Encounter (Signed)
Pt returning regarding upcoming cardiac imaging study; pt verbalizes understanding of appt date/time, parking situation and where to check in, pre-test NPO status and medications ordered, and verified current allergies; name and call back number provided for further questions should they arise  [facility] [phone] office [phone] cell  Pt verbalized understanding of how to take 13hr prep and states husband will be with her for CT scan appointment.

## 2019-11-08 ENCOUNTER — Other Ambulatory Visit: Payer: Self-pay

## 2019-11-08 ENCOUNTER — Encounter (HOSPITAL_COMMUNITY): Payer: Self-pay

## 2019-11-08 ENCOUNTER — Ambulatory Visit (HOSPITAL_COMMUNITY)
Admission: RE | Admit: 2019-11-08 | Discharge: 2019-11-08 | Disposition: A | Discharge status: Home / Self Care | Payer: Medicare Other | Source: Ambulatory Visit | Attending: Physician Assistant | Admitting: Physician Assistant

## 2019-11-08 DIAGNOSIS — I253 Aneurysm of heart: Secondary | ICD-10-CM

## 2019-11-08 DIAGNOSIS — R931 Abnormal findings on diagnostic imaging of heart and coronary circulation: Secondary | ICD-10-CM | POA: Diagnosis present

## 2019-11-08 LAB — POCT I-STAT CREATININE: Creatinine, Ser: 0.6 mg/dL (ref 0.44–1.00)

## 2019-11-08 MED ORDER — NITROGLYCERIN 0.4 MG SL SUBL
0.8000 mg | SUBLINGUAL_TABLET | Freq: Once | SUBLINGUAL | Status: AC
  Administered 2019-11-08: 0.8 mg via SUBLINGUAL

## 2019-11-08 MED ORDER — IOHEXOL 350 MG/ML SOLN
80.0000 mL | Freq: Once | INTRAVENOUS | Status: AC | PRN
  Administered 2019-11-08: 80 mL via INTRAVENOUS

## 2019-11-08 MED ORDER — METOPROLOL TARTRATE 5 MG/5ML IV SOLN
INTRAVENOUS | Status: AC
  Administered 2019-11-08: 5 mg
  Filled 2019-11-08: qty 15

## 2019-11-08 MED ORDER — NITROGLYCERIN 0.4 MG SL SUBL
SUBLINGUAL_TABLET | SUBLINGUAL | Status: AC
  Filled 2019-11-08: qty 2

## 2019-11-10 ENCOUNTER — Ambulatory Visit (HOSPITAL_COMMUNITY)
Admission: RE | Admit: 2019-11-10 | Discharge: 2019-11-10 | Disposition: A | Discharge status: Home / Self Care | Payer: Medicare Other | Source: Ambulatory Visit | Attending: Physician Assistant | Admitting: Physician Assistant

## 2019-11-10 DIAGNOSIS — R931 Abnormal findings on diagnostic imaging of heart and coronary circulation: Secondary | ICD-10-CM | POA: Insufficient documentation

## 2019-11-10 DIAGNOSIS — I253 Aneurysm of heart: Secondary | ICD-10-CM | POA: Diagnosis present

## 2019-11-11 DIAGNOSIS — I2510 Atherosclerotic heart disease of native coronary artery without angina pectoris: Secondary | ICD-10-CM

## 2019-11-15 ENCOUNTER — Other Ambulatory Visit: Payer: Self-pay

## 2019-11-15 ENCOUNTER — Encounter: Payer: Self-pay | Admitting: Internal Medicine

## 2019-11-15 ENCOUNTER — Ambulatory Visit: Payer: Medicare Other | Admitting: Internal Medicine

## 2019-11-15 VITALS — BP 156/82 | HR 94 | Ht 64.0 in | Wt 185.6 lb

## 2019-11-15 DIAGNOSIS — Z01812 Encounter for preprocedural laboratory examination: Secondary | ICD-10-CM | POA: Diagnosis not present

## 2019-11-15 DIAGNOSIS — I2510 Atherosclerotic heart disease of native coronary artery without angina pectoris: Secondary | ICD-10-CM

## 2019-11-15 DIAGNOSIS — R931 Abnormal findings on diagnostic imaging of heart and coronary circulation: Secondary | ICD-10-CM

## 2019-11-15 DIAGNOSIS — R9389 Abnormal findings on diagnostic imaging of other specified body structures: Secondary | ICD-10-CM | POA: Diagnosis not present

## 2019-11-15 DIAGNOSIS — I253 Aneurysm of heart: Secondary | ICD-10-CM

## 2019-11-15 DIAGNOSIS — I63342 Cerebral infarction due to thrombosis of left cerebellar artery: Secondary | ICD-10-CM

## 2019-11-15 MED ORDER — PREDNISONE 50 MG PO TABS: ORAL_TABLET | ORAL | 0 refills | Status: DC

## 2019-11-15 MED ORDER — DIPHENHYDRAMINE HCL 50 MG PO TABS: ORAL_TABLET | ORAL | 0 refills | Status: DC

## 2019-11-15 MED ORDER — CARVEDILOL 3.125 MG PO TABS: 3.1250 mg | ORAL_TABLET | Freq: Two times a day (BID) | ORAL | 3 refills | Status: DC

## 2019-11-15 NOTE — Patient Instructions (Addendum)
Medication Instructions:  Please Start Coreg 3.125mg  twice daily  *If you need a refill on your cardiac medications before your next appointment, please call your pharmacy*  Lab Work: BMET and [place]  If you have labs (blood work) drawn today and your tests are completely normal, you will receive your results only by:  [place] (if you have MyChart) OR  A paper copy in the mail If you have any lab test that is abnormal or we need to change your treatment, we will call you to review the results.  Testing/Procedures: LEFT HEART CATH-- PLEASE SEE INSTRUCTIONS BELOW   Follow-Up: At [facility], you and your health needs are our priority.  As part of our continuing mission to provide you with exceptional heart care, we have created designated Provider Care Teams.  These Care Teams include your primary Cardiologist (physician) and Advanced Practice Providers (APPs -  Physician Assistants and Nurse Practitioners) who all work together to provide you with the care you need, when you need it.  Your next appointment:   1 week(s)  The format for your next appointment:   In Person  Provider:   Cherlynn Kaiser, MD   Other Instructions YOU WILL ALSO NEED TO [place]- 11/19/19 at 12:25pm.       [address] [place] [address] Dept: [phone] Loc: [place]  11/15/2019  You are scheduled for a Cardiac Catheterization on Tuesday, November 2 with Dr. Larae Grooms.  1. Please arrive at the [facility] (Main Entrance A) at [facility]: [address] [place], [place] [address] at 10:00 AM (This time is two hours before your procedure to ensure your preparation). Free valet parking service is available.   Special note: Every effort is made to have your procedure done on time. Please understand that emergencies sometimes delay  scheduled procedures.  2. Diet: Do not eat solid foods after midnight.  The patient may have clear liquids until 5am upon the day of the procedure.  3. Labs: You will need to have blood drawn on Monday, October 25 at [facility]  Open: 8am - 5pm (Lunch 12:30 - 1:30)   Phone: [phone]. You do not need to be fasting.  4. Medication instructions in preparation for your procedure:   Contrast Allergy: Yes, Please take Prednisone 50mg  by mouth at: Thirteen hours prior to cath 11:00pm on Monday Seven hours prior to cath 5:00am on Tuesday And prior to leaving home please take last dose of Prednisone 50mg  and Benadryl 50mg  by mouth.  On the morning of your procedure, take your Aspirin and any morning medicines NOT listed above.  You may use sips of water.  5. Plan for one night stay--bring personal belongings. 6. Bring a current list of your medications and current insurance cards. 7. You MUST have a responsible person to drive you home. 8. Someone MUST be with you the first 24 hours after you arrive home or your discharge will be delayed. 9. Please wear clothes that are easy to get on and off and wear slip-on shoes.  Thank you for allowing [place] to care for you!   -- [facility]

## 2019-11-15 NOTE — Progress Notes (Signed)
Cardiology Office Note:    Date:  11/15/2019   ID:  Jamie Hodge, DOB Nov 09, 1940, MRN [id number]  PCP:  Patient, No Pcp Per  Cardiologist:  Elouise Munroe, MD  Electrophysiologist:  None   Referring MD: No ref. provider found   Chief Complaint/Reason for Referral: Abnormal coronary CTA, dyspnea on exertion, LV apical aneurysm  History of Present Illness:    Jamie Hodge is a 79 y.o. female with a history of recent small subacute left cerebellar infarct who presents for follow up after abnormal CCTA. No previously diagnosed cardiovascular disease.   I initially met the patient in the hospital while admitted for stroke with sx of lightheadedness, vertigo, slurred speech. Echo obtained in hospital demonstrating apical aneurysm without apical thrombus, and grossly normal EF.   I recommended a CCTA to evaluate for CAD in the setting of stroke and evaluate the LV apex in a more comprehensive manner. We discussed results of CCTA today. She has dyspnea on exertion, but denies significant chest pain or pressure. Denies palpitations, PND, orthopnea, leg swelling, syncope.   CCTA: 1. Severe CAD in proximal LAD, ostial first diagonal, and proximal ramus intermedius, CADRADS = 5 due to total occlusion of first diagonal branch with distal recanalization. CT FFR will be performed and reported separately.  2. Coronary calcium score is 1054, which places the patient in the 93rd percentile for age and sex matched control.  3. 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 No apical or mural thrombus.  CT FFR: 1. CT FFR analysis showed hemodynamically significant, severe stenosis in the proximal LAD, ostial first diagonal which is totally occluded, and proximal ramus intermedius artery.  RECOMMENDATIONS: Recommend coronary angiography to further assess severe coronary artery disease.  Past Medical History:  Diagnosis Date  . Herpes zoster 2012  . Vertigo     Past  Surgical History:  Procedure Laterality Date  . RHINOPLASTY      Current Medications: Current Meds  Medication Sig  . aspirin EC 81 MG tablet Take 81 mg by mouth daily. Swallow whole.  Marland Kitchen atorvastatin (LIPITOR) 80 MG tablet Take 1 tablet (80 mg total) by mouth daily.  . Multiple Vitamin (MULTIVITAMIN ADULT PO) Take 1 tablet by mouth daily.  . sertraline (ZOLOFT) 25 MG tablet Take 1 tablet (25 mg total) by mouth daily.     Allergies:   Mango flavor and Contrast media [iodinated diagnostic agents]   Social History   Tobacco Use  . Smoking status: Never Smoker  . Smokeless tobacco: Never Used  Substance Use Topics  . Alcohol use: No  . Drug use: No     Family History: The patient's family history includes CAD (age of onset: 44) in her father; Lung cancer (age of onset: 97) in her father; Unexplained death in her mother.  ROS:   Please see the history of present illness.    All other systems reviewed and are negative.  EKGs/Labs/Other Studies Reviewed:    The following studies were reviewed today:  EKG:  NSR, low voltage QRS, anterior infarct  I have independently reviewed the images from CCTA 11/08/19.  Recent Labs: 09/25/2019: BUN 15; Hemoglobin 13.9; Platelets 274; Potassium 3.3; Sodium 138 11/08/2019: Creatinine, Ser 0.60  Recent Lipid Panel    Component Value Date/Time   CHOL 335 (H) 09/25/2019 0302   TRIG 226 (H) 09/25/2019 0302   HDL 32 (L) 09/25/2019 0302   CHOLHDL 10.5 09/25/2019 0302   VLDL 45 (H) 09/25/2019 0302   LDLCALC 258 (H)  09/25/2019 0302    Physical Exam:    VS:  BP (!) 156/82   Pulse 94   Ht _0  (1.626 m)   Wt 185 lb 9.6 oz (84.2 kg)   BMI 31.86 kg/m     Wt Readings from Last 5 Encounters:  11/15/19 185 lb 9.6 oz (84.2 kg)  10/28/19 188 lb (85.3 kg)  09/23/19 185 lb (83.9 kg)  10/09/12 190 lb (86.2 kg)  09/03/10 192 lb (87.1 kg)    Constitutional: No acute distress Eyes: sclera non-icteric, normal conjunctiva and lids ENMT:  normal dentition, moist mucous membranes Cardiovascular: regular rhythm, normal rate, no murmurs. S1 and S2 normal. Radial pulses normal bilaterally. No jugular venous distention.  Respiratory: clear to auscultation bilaterally GI : normal bowel sounds, soft and nontender. No distention.   MSK: extremities warm, well perfused. No edema.  NEURO: grossly nonfocal exam, moves all extremities. PSYCH: alert and oriented x 3, normal mood and affect.   ASSESSMENT:    1. Abnormal computed tomography angiography (CTA)   2. Pre-procedure lab exam   3. Abnormal echocardiogram   4. Left ventricular apical aneurysm   5. 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 Coronary artery disease involving native coronary artery of native heart without angina pectoris    PLAN:    CAD with evidence of prior infarct and dyspnea on exertion - evidence of severe CAD in LAD territory and ramus intermedius. Recommend coronary angiography to define anatomy further. Patinet counseled extensively on risks, benefits and alternatives. Discussed possibility of surgical disease and CABG.  INFORMED CONSENT: I have reviewed the risks, indications, and alternatives to cardiac catheterization, possible angioplasty, and stenting with the patient. Risks include but are not limited to bleeding, infection, vascular injury, stroke, myocardial infection, arrhythmia, kidney injury, radiation-related injury in the case of prolonged fluoroscopy use, emergency cardiac surgery, and death. The patient understands the risks of serious complication is 1-2 in 3570 with diagnostic cardiac cath and 1-2% or less with angioplasty/stenting.   - will initiate carvedilol 3.125 mg BID. -continue asa 81 mg daily - continue atorvastatin 80 mg daily  HLD  - continue atorvastatin 80 mg daily, will recheck lipids in December with CMP as well.  Elevated BP, no prior history of HTN -start carvedilol as  above.   Total time of encounter: 30 minutes total time of encounter, including 25 minutes spent in face-to-face patient care on the date of this encounter. This time includes coordination of care and counseling regarding above mentioned problem list. Remainder of non-face-to-face time involved reviewing chart documents/testing relevant to the patient encounter and documentation in the medical record. I have independently reviewed documentation from referring provider.   Cherlynn Kaiser, MD [place]  [facility]    Medication Adjustments/Labs and Tests Ordered: Current medicines are reviewed at length with the patient today.  Concerns regarding medicines are outlined above.   Orders Placed This Encounter  Procedures  . Basic metabolic panel  . CBC  . EKG 12-Lead    Meds ordered this encounter  Medications  . carvedilol (COREG) 3.125 MG tablet    Sig: Take 1 tablet (3.125 mg total) by mouth 2 (two) times daily.    Dispense:  60 tablet    Refill:  3  . predniSONE (DELTASONE) 50 MG tablet    Sig: Take 1 Tablet every 6 hours starting the night before the Heart Cath . Take last dose before you leave for the test.  Dispense:  3 tablet    Refill:  0  . diphenhydrAMINE (BENADRYL) 50 MG tablet    Sig: Take with your last prednisone tablet  before leaving for test.    Dispense:  1 tablet    Refill:  0    Patient Instructions  Medication Instructions:  Please Start Coreg 3.130m twice daily  *If you need a refill on your cardiac medications before your next appointment, please call your pharmacy*  Lab Work: BMET and CTatamy If you have labs (blood work) drawn today and your tests are completely normal, you will receive your results only by: .Marland KitchenMyChart Message (if you have MyChart) OR . A paper copy in the mail If you have any lab test that is abnormal or we need to change your treatment, we will call you to review the results.  Testing/Procedures: LEFT HEART CATH--  PLEASE SEE INSTRUCTIONS BELOW   Follow[facility]Up: [facility] our [facility].  As part of our continuing mission to provide you with exceptional heart care, we have created designated Provider Care Teams.  These Care Teams include your primary Cardiologist (physician) and Advanced Practice Providers (APPs -  Physician Assistants and Nurse Practitioners) who all work together to provide you with the care you need, when you need it.  Your next appointment:   1 week(s)  The format for your next appointment:   In Person  Provider:   GCherlynn Kaiser MD   Other Instructions YOU WILL ALSO NEED TO BTavares 11/19/19 at 12:25pm.       [id number]: [phone]: 3[place] 11/15/2019  You are scheduled for a Cardiac Catheterization on Tuesday, November 2 with Dr. JLarae Grooms  1. [facility] [address] [place] [phone] 10:00 AM (This time is two hours before your procedure to ensure your preparation). Free valet parking service is available.   Special note: Every effort is made to have your procedure done on time. Please understand that emergencies sometimes delay scheduled procedures.  2. Diet: Do not eat solid foods after midnight.  The patient may have clear liquids until 5am upon the day of the procedure.  3. Labs: You will need to [place] Monday[place]: 8am - 5pm (Lunch 12:30 - 1:30)   Phone: [phone] You do not need to be fasting.  4. Medication instructions in preparation for your procedure:   Contrast Allergy: Yes, Please take Prednisone 512mby mouth at: Thirteen hours prior to cath 11:00pm on Monday Seven hours prior to  cath 5:00am on Tuesday And prior to leaving home please take last dose of Prednisone 5055mnd Benadryl 5m64m mouth.  On the morning of your procedure, take your Aspirin and any morning medicines NOT listed above.  You may use sips of water.  5. Plan for one night stay--bring personal belongings. 6. Bring a current list of your medications and current insurance cards. 7. You MUST have a responsible person to drive you home. 8. Someone MUST be with you the first 24 hours after you arrive home or your discharge will be delayed. 9. Please wear clothes that are easy to get on and off and wear slip-on shoes.  Thank  you for allowing [place] to care for you!   -- [place] [facility]

## 2019-11-15 NOTE — H&P (View-Only) (Signed)
Cardiology Office Note:    Date:  11/15/2019   ID:  Jamie Hodge, DOB Nov 09, 1940, MRN [id number]  PCP:  Patient, No Pcp Per  Cardiologist:  Elouise Munroe, MD  Electrophysiologist:  None   Referring MD: No ref. provider found   Chief Complaint/Reason for Referral: Abnormal coronary CTA, dyspnea on exertion, LV apical aneurysm  History of Present Illness:    Jamie Hodge is a 79 y.o. female with a history of recent small subacute left cerebellar infarct who presents for follow up after abnormal CCTA. No previously diagnosed cardiovascular disease.   I initially met the patient in the hospital while admitted for stroke with sx of lightheadedness, vertigo, slurred speech. Echo obtained in hospital demonstrating apical aneurysm without apical thrombus, and grossly normal EF.   I recommended a CCTA to evaluate for CAD in the setting of stroke and evaluate the LV apex in a more comprehensive manner. We discussed results of CCTA today. She has dyspnea on exertion, but denies significant chest pain or pressure. Denies palpitations, PND, orthopnea, leg swelling, syncope.   CCTA: 1. Severe CAD in proximal LAD, ostial first diagonal, and proximal ramus intermedius, CADRADS = 5 due to total occlusion of first diagonal branch with distal recanalization. CT FFR will be performed and reported separately.  2. Coronary calcium score is 1054, which places the patient in the 93rd percentile for age and sex matched control.  3. 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 No apical or mural thrombus.  CT FFR: 1. CT FFR analysis showed hemodynamically significant, severe stenosis in the proximal LAD, ostial first diagonal which is totally occluded, and proximal ramus intermedius artery.  RECOMMENDATIONS: Recommend coronary angiography to further assess severe coronary artery disease.  Past Medical History:  Diagnosis Date  . Herpes zoster 2012  . Vertigo     Past  Surgical History:  Procedure Laterality Date  . RHINOPLASTY      Current Medications: Current Meds  Medication Sig  . aspirin EC 81 MG tablet Take 81 mg by mouth daily. Swallow whole.  Marland Kitchen atorvastatin (LIPITOR) 80 MG tablet Take 1 tablet (80 mg total) by mouth daily.  . Multiple Vitamin (MULTIVITAMIN ADULT PO) Take 1 tablet by mouth daily.  . sertraline (ZOLOFT) 25 MG tablet Take 1 tablet (25 mg total) by mouth daily.     Allergies:   Mango flavor and Contrast media [iodinated diagnostic agents]   Social History   Tobacco Use  . Smoking status: Never Smoker  . Smokeless tobacco: Never Used  Substance Use Topics  . Alcohol use: No  . Drug use: No     Family History: The patient's family history includes CAD (age of onset: 44) in her father; Lung cancer (age of onset: 97) in her father; Unexplained death in her mother.  ROS:   Please see the history of present illness.    All other systems reviewed and are negative.  EKGs/Labs/Other Studies Reviewed:    The following studies were reviewed today:  EKG:  NSR, low voltage QRS, anterior infarct  I have independently reviewed the images from CCTA 11/08/19.  Recent Labs: 09/25/2019: BUN 15; Hemoglobin 13.9; Platelets 274; Potassium 3.3; Sodium 138 11/08/2019: Creatinine, Ser 0.60  Recent Lipid Panel    Component Value Date/Time   CHOL 335 (H) 09/25/2019 0302   TRIG 226 (H) 09/25/2019 0302   HDL 32 (L) 09/25/2019 0302   CHOLHDL 10.5 09/25/2019 0302   VLDL 45 (H) 09/25/2019 0302   LDLCALC 258 (H)  09/25/2019 0302    Physical Exam:    VS:  BP (!) 156/82   Pulse 94   Ht _0  (1.626 m)   Wt 185 lb 9.6 oz (84.2 kg)   BMI 31.86 kg/m     Wt Readings from Last 5 Encounters:  11/15/19 185 lb 9.6 oz (84.2 kg)  10/28/19 188 lb (85.3 kg)  09/23/19 185 lb (83.9 kg)  10/09/12 190 lb (86.2 kg)  09/03/10 192 lb (87.1 kg)    Constitutional: No acute distress Eyes: sclera non-icteric, normal conjunctiva and lids ENMT:  normal dentition, moist mucous membranes Cardiovascular: regular rhythm, normal rate, no murmurs. S1 and S2 normal. Radial pulses normal bilaterally. No jugular venous distention.  Respiratory: clear to auscultation bilaterally GI : normal bowel sounds, soft and nontender. No distention.   MSK: extremities warm, well perfused. No edema.  NEURO: grossly nonfocal exam, moves all extremities. PSYCH: alert and oriented x 3, normal mood and affect.   ASSESSMENT:    1. Abnormal computed tomography angiography (CTA)   2. Pre-procedure lab exam   3. Abnormal echocardiogram   4. Left ventricular apical aneurysm   5. 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 Coronary artery disease involving native coronary artery of native heart without angina pectoris    PLAN:    CAD with evidence of prior infarct and dyspnea on exertion - evidence of severe CAD in LAD territory and ramus intermedius. Recommend coronary angiography to define anatomy further. Patinet counseled extensively on risks, benefits and alternatives. Discussed possibility of surgical disease and CABG.  INFORMED CONSENT: I have reviewed the risks, indications, and alternatives to cardiac catheterization, possible angioplasty, and stenting with the patient. Risks include but are not limited to bleeding, infection, vascular injury, stroke, myocardial infection, arrhythmia, kidney injury, radiation-related injury in the case of prolonged fluoroscopy use, emergency cardiac surgery, and death. The patient understands the risks of serious complication is 1-2 in 3570 with diagnostic cardiac cath and 1-2% or less with angioplasty/stenting.   - will initiate carvedilol 3.125 mg BID. -continue asa 81 mg daily - continue atorvastatin 80 mg daily  HLD  - continue atorvastatin 80 mg daily, will recheck lipids in December with CMP as well.  Elevated BP, no prior history of HTN -start carvedilol as  above.   Total time of encounter: 30 minutes total time of encounter, including 25 minutes spent in face-to-face patient care on the date of this encounter. This time includes coordination of care and counseling regarding above mentioned problem list. Remainder of non-face-to-face time involved reviewing chart documents/testing relevant to the patient encounter and documentation in the medical record. I have independently reviewed documentation from referring provider.   Cherlynn Kaiser, MD [place]  [facility]    Medication Adjustments/Labs and Tests Ordered: Current medicines are reviewed at length with the patient today.  Concerns regarding medicines are outlined above.   Orders Placed This Encounter  Procedures  . Basic metabolic panel  . CBC  . EKG 12-Lead    Meds ordered this encounter  Medications  . carvedilol (COREG) 3.125 MG tablet    Sig: Take 1 tablet (3.125 mg total) by mouth 2 (two) times daily.    Dispense:  60 tablet    Refill:  3  . predniSONE (DELTASONE) 50 MG tablet    Sig: Take 1 Tablet every 6 hours starting the night before the Heart Cath . Take last dose before you leave for the test.  Dispense:  3 tablet    Refill:  0  . diphenhydrAMINE (BENADRYL) 50 MG tablet    Sig: Take with your last prednisone tablet  before leaving for test.    Dispense:  1 tablet    Refill:  0    Patient Instructions  Medication Instructions:  Please Start Coreg 3.130m twice daily  *If you need a refill on your cardiac medications before your next appointment, please call your pharmacy*  Lab Work: BMET and CTatamy If you have labs (blood work) drawn today and your tests are completely normal, you will receive your results only by: .Marland KitchenMyChart Message (if you have MyChart) OR . A paper copy in the mail If you have any lab test that is abnormal or we need to change your treatment, we will call you to review the results.  Testing/Procedures: LEFT HEART CATH--  PLEASE SEE INSTRUCTIONS BELOW   Follow-Up: [facility] and [facility] are our [facility].  As part of our continuing mission to provide you with exceptional heart care, we have created designated Provider Care Teams.  These Care Teams include your primary Cardiologist (physician) and Advanced Practice Providers (APPs -  Physician Assistants and Nurse Practitioners) who all work together to provide you with the care you need, when you need it.  Your next appointment:   1 week(s)  The format for your next appointment:   In Person  Provider:   GCherlynn Kaiser MD   Other Instructions YOU WILL ALSO NEED TO [place] 11/19/19 at 12:25pm.       [id number]: [phone]: 3[place] 11/15/2019  You are scheduled for a Cardiac Catheterization on Tuesday, November 2 with Dr. JLarae Grooms  1. [facility] [address] [place] [phone] 10:00 AM (This time is two hours before your procedure to ensure your preparation). Free valet parking service is available.   Special note: Every effort is made to have your procedure done on time. Please understand that emergencies sometimes delay scheduled procedures.  2. Diet: Do not eat solid foods after midnight.  The patient may have clear liquids until 5am upon the day of the procedure.  3. Labs: You will need to have blood drawn on Monday, [place]: 8am - 5pm (Lunch 12:30 - 1:30)   Phone: [phone] You do not need to be fasting.  4. Medication instructions in preparation for your procedure:   Contrast Allergy: Yes, Please take Prednisone 512mby mouth at: Thirteen hours prior to cath 11:00pm on Monday Seven hours prior to  cath 5:00am on Tuesday And prior to leaving home please take last dose of Prednisone 5055mnd Benadryl 5m64m mouth.  On the morning of your procedure, take your Aspirin and any morning medicines NOT listed above.  You may use sips of water.  5. Plan for one night stay--bring personal belongings. 6. Bring a current list of your medications and current insurance cards. 7. You MUST have a responsible person to drive you home. 8. Someone MUST be with you the first 24 hours after you arrive home or your discharge will be delayed. 9. Please wear clothes that are easy to get on and off and wear slip-on shoes.  Thank  you for allowing [place] to care for you!   -- [place] [facility]

## 2019-11-16 LAB — BASIC METABOLIC PANEL
BUN/Creatinine Ratio: 23 (ref 12–28)
BUN: 16 mg/dL (ref 8–27)
CO2: 24 mmol/L (ref 20–29)
Calcium: 10 mg/dL (ref 8.7–10.3)
Chloride: 99 mmol/L (ref 96–106)
Creatinine, Ser: 0.69 mg/dL (ref 0.57–1.00)
GFR calc Af Amer: 97 mL/min/{1.73_m2} (ref >59)
GFR calc non Af Amer: 84 mL/min/{1.73_m2} (ref >59)
Glucose: 92 mg/dL (ref 65–99)
Potassium: 4.5 mmol/L (ref 3.5–5.2)
Sodium: 138 mmol/L (ref 134–144)

## 2019-11-16 LAB — CBC
Hematocrit: 43.7 % (ref 34.0–46.6)
Hemoglobin: 14.4 g/dL (ref 11.1–15.9)
MCH: 28.7 pg (ref 26.6–33.0)
MCHC: 33 g/dL (ref 31.5–35.7)
MCV: 87 fL (ref 79–97)
Platelets: 270 10*3/uL (ref 150–450)
RBC: 5.01 x10E6/uL (ref 3.77–5.28)
RDW: 13.1 % (ref 11.7–15.4)
WBC: 11.1 10*3/uL — ABNORMAL HIGH (ref 3.4–10.8)

## 2019-11-19 ENCOUNTER — Other Ambulatory Visit: Payer: Self-pay

## 2019-11-19 ENCOUNTER — Other Ambulatory Visit (HOSPITAL_COMMUNITY)
Admission: RE | Admit: 2019-11-19 | Discharge: 2019-11-19 | Disposition: A | Discharge status: Home / Self Care | Payer: Medicare Other | Source: Ambulatory Visit | Attending: Interventional Cardiology | Admitting: Interventional Cardiology

## 2019-11-19 DIAGNOSIS — Z01812 Encounter for preprocedural laboratory examination: Secondary | ICD-10-CM | POA: Insufficient documentation

## 2019-11-19 DIAGNOSIS — Z20822 Contact with and (suspected) exposure to covid-19: Secondary | ICD-10-CM | POA: Diagnosis not present

## 2019-11-19 DIAGNOSIS — I2510 Atherosclerotic heart disease of native coronary artery without angina pectoris: Secondary | ICD-10-CM

## 2019-11-19 MED ORDER — SODIUM CHLORIDE 0.9% FLUSH: 3.0000 mL | Freq: Two times a day (BID) | INTRAVENOUS | Status: DC

## 2019-11-20 LAB — SARS CORONAVIRUS 2 (TAT 6-24 HRS): SARS Coronavirus 2: NEGATIVE

## 2019-11-22 ENCOUNTER — Telehealth: Payer: Self-pay | Admitting: *Deleted

## 2019-11-22 NOTE — Telephone Encounter (Signed)
Pt contacted pre-catheterization scheduled at [facility] for: Tuesday November 23, 2019 12 Noon Verified arrival time and place: [place] [facility]) at: 10 AM   No solid food after midnight prior to cath, clear liquids until 5 AM day of procedure.  CONTRAST ALLERGY: yes-13 hour Prednisone and Benadryl Prep reviewed with patient: 11/22/19 Prednisone 50 mg 11 PM 11/23/19 Prednisone 50 mg 5 AM 11/23/19 Prednisone 50 mg and Benadryl 50 mg -just prior to leaving for hospital Pt advised not to drive  AM meds can be  taken pre-cath with sips of water including: ASA 81 mg Prednisone 50 mg Benadryl 50 mg  Confirmed patient has responsible adult to drive home post procedure and be with patient first 24 hours after arriving home: yes  You are allowed ONE visitor in the waiting room during the time you are at the hospital for your procedure. Both you and your visitor must wear a mask once you enter the hospital.       COVID-19 Pre-Screening Questions:  . In the past 14 days have you had a new cough, new headache, new nasal congestion, fever (100.4 or greater) unexplained body aches, new sore throat, or sudden loss of taste or sense of smell? no . In the past 14 days have you been around anyone with known Covid 19? no    Reviewed procedure/mask/visitor instructions, COVID-19 questions with patient.

## 2019-11-23 ENCOUNTER — Other Ambulatory Visit: Payer: Self-pay | Admitting: *Deleted

## 2019-11-23 ENCOUNTER — Encounter (HOSPITAL_COMMUNITY)
Admission: AD | Disposition: A | Discharge status: Home / Self Care | Payer: Self-pay | Source: Home / Self Care | Attending: Interventional Cardiology

## 2019-11-23 ENCOUNTER — Observation Stay (HOSPITAL_COMMUNITY)
Admission: AD | Admit: 2019-11-23 | Discharge: 2019-11-25 | Disposition: A | Discharge status: Home / Self Care | Payer: Medicare Other | Attending: Interventional Cardiology | Admitting: Interventional Cardiology

## 2019-11-23 DIAGNOSIS — Z20822 Contact with and (suspected) exposure to covid-19: Secondary | ICD-10-CM | POA: Insufficient documentation

## 2019-11-23 DIAGNOSIS — I2510 Atherosclerotic heart disease of native coronary artery without angina pectoris: Secondary | ICD-10-CM | POA: Diagnosis present

## 2019-11-23 DIAGNOSIS — I25118 Atherosclerotic heart disease of native coronary artery with other forms of angina pectoris: Principal | ICD-10-CM | POA: Insufficient documentation

## 2019-11-23 DIAGNOSIS — I253 Aneurysm of heart: Secondary | ICD-10-CM | POA: Insufficient documentation

## 2019-11-23 DIAGNOSIS — E785 Hyperlipidemia, unspecified: Secondary | ICD-10-CM

## 2019-11-23 DIAGNOSIS — Z01818 Encounter for other preprocedural examination: Secondary | ICD-10-CM

## 2019-11-23 DIAGNOSIS — R079 Chest pain, unspecified: Secondary | ICD-10-CM | POA: Diagnosis present

## 2019-11-23 DIAGNOSIS — Z7982 Long term (current) use of aspirin: Secondary | ICD-10-CM | POA: Diagnosis not present

## 2019-11-23 DIAGNOSIS — R9081 Abnormal echoencephalogram: Secondary | ICD-10-CM | POA: Diagnosis not present

## 2019-11-23 DIAGNOSIS — R0600 Dyspnea, unspecified: Secondary | ICD-10-CM | POA: Diagnosis present

## 2019-11-23 DIAGNOSIS — I10 Essential (primary) hypertension: Secondary | ICD-10-CM

## 2019-11-23 HISTORY — PX: LEFT HEART CATH AND CORONARY ANGIOGRAPHY: CATH118249

## 2019-11-23 HISTORY — DX: Cerebral infarction, unspecified: I63.9

## 2019-11-23 LAB — CBC
HCT: 42.3 % (ref 36.0–46.0)
Hemoglobin: 14.1 g/dL (ref 12.0–15.0)
MCH: 29.7 pg (ref 26.0–34.0)
MCHC: 33.3 g/dL (ref 30.0–36.0)
MCV: 89.1 fL (ref 80.0–100.0)
Platelets: 261 10*3/uL (ref 150–400)
RBC: 4.75 MIL/uL (ref 3.87–5.11)
RDW: 13.8 % (ref 11.5–15.5)
WBC: 12.7 10*3/uL — ABNORMAL HIGH (ref 4.0–10.5)
nRBC: 0 % (ref 0.0–0.2)

## 2019-11-23 LAB — CREATININE, SERUM
Creatinine, Ser: 0.86 mg/dL (ref 0.44–1.00)
GFR, Estimated: >60 mL/min (ref >60)

## 2019-11-23 SURGERY — LEFT HEART CATH AND CORONARY ANGIOGRAPHY
Anesthesia: LOCAL

## 2019-11-23 MED ORDER — CARVEDILOL 3.125 MG PO TABS
3.1250 mg | ORAL_TABLET | Freq: Two times a day (BID) | ORAL | Status: DC
  Administered 2019-11-23: 3.125 mg via ORAL
  Filled 2019-11-23: qty 1

## 2019-11-23 MED ORDER — HYDRALAZINE HCL 20 MG/ML IJ SOLN: 10.0000 mg | INTRAMUSCULAR | Status: AC | PRN

## 2019-11-23 MED ORDER — SODIUM CHLORIDE 0.9% FLUSH: 3.0000 mL | INTRAVENOUS | Status: DC | PRN

## 2019-11-23 MED ORDER — HEPARIN SODIUM (PORCINE) 5000 UNIT/ML IJ SOLN
5000.0000 [IU] | Freq: Three times a day (TID) | INTRAMUSCULAR | Status: DC
  Administered 2019-11-23 – 2019-11-25 (×5): 5000 [IU] via SUBCUTANEOUS
  Filled 2019-11-23 (×5): qty 1

## 2019-11-23 MED ORDER — HEPARIN SODIUM (PORCINE) 1000 UNIT/ML IJ SOLN
INTRAMUSCULAR | Status: AC
  Filled 2019-11-23: qty 1

## 2019-11-23 MED ORDER — SODIUM CHLORIDE 0.9 % WEIGHT BASED INFUSION
3.0000 mL/kg/h | INTRAVENOUS | Status: DC
  Administered 2019-11-23: 3 mL/kg/h via INTRAVENOUS

## 2019-11-23 MED ORDER — FENTANYL CITRATE (PF) 100 MCG/2ML IJ SOLN
INTRAMUSCULAR | Status: DC | PRN
  Administered 2019-11-23: 25 ug via INTRAVENOUS

## 2019-11-23 MED ORDER — ACETAMINOPHEN 325 MG PO TABS: 650.0000 mg | ORAL_TABLET | ORAL | Status: DC | PRN

## 2019-11-23 MED ORDER — MIDAZOLAM HCL 2 MG/2ML IJ SOLN
INTRAMUSCULAR | Status: DC | PRN
  Administered 2019-11-23: 2 mg via INTRAVENOUS

## 2019-11-23 MED ORDER — ONDANSETRON HCL 4 MG/2ML IJ SOLN: 4.0000 mg | Freq: Four times a day (QID) | INTRAMUSCULAR | Status: DC | PRN

## 2019-11-23 MED ORDER — LIDOCAINE HCL (PF) 1 % IJ SOLN
INTRAMUSCULAR | Status: AC
  Filled 2019-11-23: qty 30

## 2019-11-23 MED ORDER — SODIUM CHLORIDE 0.9 % IV SOLN: 250.0000 mL | INTRAVENOUS | Status: DC | PRN

## 2019-11-23 MED ORDER — HEPARIN (PORCINE) IN NACL 1000-0.9 UT/500ML-% IV SOLN
INTRAVENOUS | Status: AC
  Filled 2019-11-23: qty 1000

## 2019-11-23 MED ORDER — SODIUM CHLORIDE 0.9 % WEIGHT BASED INFUSION: 1.0000 mL/kg/h | INTRAVENOUS | Status: DC

## 2019-11-23 MED ORDER — OCUVITE-LUTEIN PO CAPS
2.0000 | ORAL_CAPSULE | Freq: Every day | ORAL | Status: DC
  Filled 2019-11-23: qty 2

## 2019-11-23 MED ORDER — VERAPAMIL HCL 2.5 MG/ML IV SOLN
INTRAVENOUS | Status: AC
  Filled 2019-11-23: qty 2

## 2019-11-23 MED ORDER — ATORVASTATIN CALCIUM 80 MG PO TABS
80.0000 mg | ORAL_TABLET | Freq: Every day | ORAL | Status: DC
  Administered 2019-11-23 – 2019-11-25 (×3): 80 mg via ORAL
  Filled 2019-11-23 (×3): qty 1

## 2019-11-23 MED ORDER — SERTRALINE HCL 50 MG PO TABS: 25.0000 mg | ORAL_TABLET | Freq: Every day | ORAL | Status: DC | PRN

## 2019-11-23 MED ORDER — ASPIRIN 81 MG PO CHEW: 81.0000 mg | CHEWABLE_TABLET | ORAL | Status: DC

## 2019-11-23 MED ORDER — FENTANYL CITRATE (PF) 100 MCG/2ML IJ SOLN
INTRAMUSCULAR | Status: AC
  Filled 2019-11-23: qty 2

## 2019-11-23 MED ORDER — LIDOCAINE HCL (PF) 1 % IJ SOLN
INTRAMUSCULAR | Status: DC | PRN
  Administered 2019-11-23: 2 mL

## 2019-11-23 MED ORDER — IOHEXOL 350 MG/ML SOLN
INTRAVENOUS | Status: DC | PRN
  Administered 2019-11-23: 60 mL

## 2019-11-23 MED ORDER — ASPIRIN EC 81 MG PO TBEC
81.0000 mg | DELAYED_RELEASE_TABLET | Freq: Every day | ORAL | Status: DC
  Administered 2019-11-24 – 2019-11-25 (×2): 81 mg via ORAL
  Filled 2019-11-23 (×2): qty 1

## 2019-11-23 MED ORDER — PRESERVISION AREDS 2 PO CAPS: 2.0000 | ORAL_CAPSULE | Freq: Every day | ORAL | Status: DC

## 2019-11-23 MED ORDER — VERAPAMIL HCL 2.5 MG/ML IV SOLN
INTRAVENOUS | Status: DC | PRN
  Administered 2019-11-23: 10 mL via INTRA_ARTERIAL

## 2019-11-23 MED ORDER — SODIUM CHLORIDE 0.9% FLUSH: 3.0000 mL | Freq: Two times a day (BID) | INTRAVENOUS | Status: DC

## 2019-11-23 MED ORDER — SODIUM CHLORIDE 0.9 % IV SOLN: INTRAVENOUS | Status: AC

## 2019-11-23 MED ORDER — TRIAMCINOLONE ACETONIDE 55 MCG/ACT NA AERO
1.0000 | INHALATION_SPRAY | Freq: Every day | NASAL | Status: DC | PRN
  Filled 2019-11-23: qty 10.8

## 2019-11-23 MED ORDER — SODIUM CHLORIDE 0.9% FLUSH
3.0000 mL | Freq: Two times a day (BID) | INTRAVENOUS | Status: DC
  Administered 2019-11-23 – 2019-11-25 (×4): 3 mL via INTRAVENOUS

## 2019-11-23 MED ORDER — HEPARIN SODIUM (PORCINE) 1000 UNIT/ML IJ SOLN
INTRAMUSCULAR | Status: DC | PRN
  Administered 2019-11-23: 4000 [IU] via INTRAVENOUS

## 2019-11-23 MED ORDER — MIDAZOLAM HCL 2 MG/2ML IJ SOLN
INTRAMUSCULAR | Status: AC
  Filled 2019-11-23: qty 2

## 2019-11-23 MED ORDER — HEPARIN (PORCINE) IN NACL 1000-0.9 UT/500ML-% IV SOLN
INTRAVENOUS | Status: DC | PRN
  Administered 2019-11-23 (×2): 500 mL

## 2019-11-23 MED ORDER — PROSIGHT PO TABS
2.0000 | ORAL_TABLET | Freq: Every day | ORAL | Status: DC
  Administered 2019-11-23 – 2019-11-25 (×3): 2 via ORAL
  Filled 2019-11-23 (×2): qty 2

## 2019-11-23 MED ORDER — LABETALOL HCL 5 MG/ML IV SOLN: 10.0000 mg | INTRAVENOUS | Status: AC | PRN

## 2019-11-23 SURGICAL SUPPLY — 9 items
CATH 5FR JL3.5 JR4 ANG PIG MP (CATHETERS) ×2 IMPLANT
DEVICE RAD COMP TR BAND LRG (VASCULAR PRODUCTS) ×2 IMPLANT
GLIDESHEATH SLEND SS 6F .021 (SHEATH) ×4 IMPLANT
GUIDEWIRE INQWIRE 1.5J.035X260 (WIRE) ×2 IMPLANT
INQWIRE 1.5J .035X260CM (WIRE) ×4
KIT HEART LEFT (KITS) ×2 IMPLANT
PACK CARDIAC CATHETERIZATION (CUSTOM PROCEDURE TRAY) ×2 IMPLANT
TRANSDUCER W/STOPCOCK (MISCELLANEOUS) ×2 IMPLANT
TUBING CIL FLEX 10 FLL-RA (TUBING) ×2 IMPLANT

## 2019-11-23 NOTE — Progress Notes (Signed)
1600 TR BAND REMOVED PER PROTOCOL. NO BLEEDING OR HEMATOMA. SKIN WARM AND NORMAL COLOR. PT GIVEN INSTRUCTIONS FOR POST TR BAND AND VOICED UNDERSTANDING.

## 2019-11-23 NOTE — Progress Notes (Signed)
TCTS consulted for CABG evaluation. °

## 2019-11-23 NOTE — Interval H&P Note (Signed)
Cath Lab Visit (complete for each Cath Lab visit)  Clinical Evaluation Leading to the Procedure:   ACS: No.  Non-ACS:    Anginal Classification: CCS III  Anti-ischemic medical therapy: Minimal Therapy (1 class of medications)  Non-Invasive Test Results: High-risk stress test findings: cardiac mortality >3%/year  Prior CABG: No previous CABG   Abnormal CTA coronaries.  Multivessel disease noted.    History and Physical Interval Note:  11/23/2019 1:35 PM  Jamie Hodge  has presented today for surgery, with the diagnosis of abnormal ct.  The various methods of treatment have been discussed with the patient and family. After consideration of risks, benefits and other options for treatment, the patient has consented to  Procedure(s): LEFT HEART CATH AND CORONARY ANGIOGRAPHY (N/A) as a surgical intervention.  The patient's history has been reviewed, patient examined, no change in status, stable for surgery.  I have reviewed the patient's chart and labs.  Questions were answered to the patient's satisfaction.     Larae Grooms

## 2019-11-24 ENCOUNTER — Inpatient Hospital Stay (HOSPITAL_COMMUNITY): Payer: Medicare Other

## 2019-11-24 ENCOUNTER — Encounter (HOSPITAL_COMMUNITY): Payer: Self-pay | Admitting: Interventional Cardiology

## 2019-11-24 DIAGNOSIS — Z20822 Contact with and (suspected) exposure to covid-19: Secondary | ICD-10-CM | POA: Diagnosis not present

## 2019-11-24 DIAGNOSIS — I10 Essential (primary) hypertension: Secondary | ICD-10-CM

## 2019-11-24 DIAGNOSIS — I25118 Atherosclerotic heart disease of native coronary artery with other forms of angina pectoris: Secondary | ICD-10-CM | POA: Diagnosis not present

## 2019-11-24 DIAGNOSIS — I25119 Atherosclerotic heart disease of native coronary artery with unspecified angina pectoris: Secondary | ICD-10-CM | POA: Diagnosis not present

## 2019-11-24 DIAGNOSIS — Z7982 Long term (current) use of aspirin: Secondary | ICD-10-CM | POA: Diagnosis not present

## 2019-11-24 DIAGNOSIS — E785 Hyperlipidemia, unspecified: Secondary | ICD-10-CM

## 2019-11-24 DIAGNOSIS — Z01810 Encounter for preprocedural cardiovascular examination: Secondary | ICD-10-CM | POA: Diagnosis not present

## 2019-11-24 DIAGNOSIS — I253 Aneurysm of heart: Secondary | ICD-10-CM | POA: Diagnosis not present

## 2019-11-24 DIAGNOSIS — R079 Chest pain, unspecified: Secondary | ICD-10-CM | POA: Diagnosis present

## 2019-11-24 MED ORDER — OMEGA-3-ACID ETHYL ESTERS 1 G PO CAPS: 1.0000 g | ORAL_CAPSULE | Freq: Two times a day (BID) | ORAL | Status: DC

## 2019-11-24 MED ORDER — ICOSAPENT ETHYL 1 G PO CAPS
2.0000 g | ORAL_CAPSULE | Freq: Two times a day (BID) | ORAL | Status: DC
  Administered 2019-11-24 – 2019-11-25 (×3): 2 g via ORAL
  Filled 2019-11-24 (×4): qty 2

## 2019-11-24 MED ORDER — CARVEDILOL 6.25 MG PO TABS
6.2500 mg | ORAL_TABLET | Freq: Two times a day (BID) | ORAL | Status: DC
  Administered 2019-11-24 – 2019-11-25 (×3): 6.25 mg via ORAL
  Filled 2019-11-24 (×3): qty 1

## 2019-11-24 NOTE — Progress Notes (Signed)
CARDIAC REHAB PHASE I   PRE:  Rate/Rhythm: 75 SR    BP: sitting 149/85    SaO2:   MODE:  Ambulation: 400 ft   POST:  Rate/Rhythm: 95 SR    BP: sitting 164/72     SaO2:   Tolerated well. No c/o. Awaiting surgeon. Will hold education for now. [place], ACSM 11/24/2019 3:03 PM

## 2019-11-24 NOTE — Discharge Summary (Addendum)
Discharge Summary    Patient ID: Jamie Hodge MRN: [id number]; DOB: [birth year]  Admit date: 11/23/2019 Discharge date: 11/25/2019  Primary Care Provider: [facility] [place]  Primary Cardiologist: Elouise Munroe, MD  Primary Electrophysiologist:  None   Discharge Diagnoses    Principal Problem:   Chest pain Active Problems:   CAD (coronary artery disease)   Hyperlipidemia   Hypertension  Diagnostic Studies/Procedures    Cath: 11/23/19   Ost LAD to Prox LAD lesion is 90% stenosed.  Mid RCA lesion is 90% stenosed. 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  Mid RCA to Dist RCA lesion is 25% stenosed.  RPDA lesion is 40% stenosed.  Dist LAD lesion is 100% stenosed. Right to left collaterals present.  Ramus-1 lesion is 90% stenosed.  Ramus-2 lesion is 75% stenosed.  Ost Cx lesion is 75% stenosed.  Dist LM lesion is 25% stenosed.  Mid Cx lesion is 95% stenosed.  Mid LAD lesion is 100% stenosed. Left to left collaterals. Large parallel septal vessel.  The left ventricular ejection fraction is 45-50% by visual estimate. LVEDP 11 mm Hg.  There is no aortic valve stenosis.   Multivessel complex calcific CAD.  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  Will get CVTS consult.   Diagnostic Dominance: Right  _____________   History of Present Illness     Jamie Hodge is a 79 y.o. female with a history of recent small subacute left cerebellar infarct who presented to the office for follow up after abnormal CCTA. No previously diagnosed cardiovascular disease.   Dr. Margaretann Loveless initially met the patient in the hospital while admitted for stroke with sx of lightheadedness, vertigo, slurred speech. Echo obtained in hospital demonstrating apical aneurysm without apical thrombus, and grossly normal EF.   It was recommended that she undergo a CCTA to evaluate for CAD in the setting of stroke  and evaluate the LV apex in a more comprehensive manner, noted below. Results of the CCTA were discussed and she was set up for outpatient cardiac cath.   CCTA: 1. Severe CAD in proximal LAD, ostial first diagonal, and proximal ramus intermedius, CADRADS = 5 due to total occlusion of first diagonal branch with distal recanalization. CT FFR will be performed and reported separately.  2. Coronary calcium score is 1054, which places the patient in the 93rd percentile for age and sex matched control.  3. 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 No apical or mural thrombus.  CT FFR: 1. CT FFR analysis showed hemodynamically significant, severe stenosis in the proximal LAD, ostial first diagonal which is totally occluded, and proximal ramus intermedius artery.  RECOMMENDATIONS: Recommend coronary angiography to further assess severe coronary artery disease.   Hospital Course     Consultants: TCTS   1. CAD: underwent cardiac cath noted above with multivessel complex calcified CAD. TCTS was consulted for CABG and seen by Dr. Orvan Seen. Pre op dopplers/labs and CXR completed prior to discharge.  -- continue ASA, statin, BB and Imdur.  2. HLD: LDL 258, started on high dose statin at office visit. Trig 335. Hgb A1c 5.8. Attempted to add Vascepa but cost was a issue. Suggested she do Omega3. -- suspect she may need PCSK9 as an outpatient given notably high LDL if unable to reach goal  3. HTN: tolerated the addition of coreg 6.33m BID.   Instructed on importance of no strenuous activity over the weekend. To seek medical attention if  develops symptoms.   Did the patient have an acute coronary syndrome (MI, NSTEMI, STEMI, etc) this admission?:  No                               Did the patient have a percutaneous coronary intervention (stent / angioplasty)?:  No.   _____________  Discharge Vitals Blood pressure 133/71, pulse 71, temperature 98.1 F (36.7 C), temperature source  Oral, resp. rate 18, height '5\' 4"'  (1.626 m), weight 83.9 kg, SpO2 95 %.  Filed Weights   11/23/19 1000 11/24/19 0543 11/25/19 0618  Weight: 83.9 kg 84.1 kg 83.9 kg    Labs & Radiologic Studies    CBC Recent Labs    11/23/19 1633 11/25/19 0144  WBC 12.7* 11.4*  HGB 14.1 12.3  HCT 42.3 37.4  MCV 89.1 91.0  PLT 261 979   Basic Metabolic Panel Recent Labs    11/23/19 1633 11/25/19 0144  NA  --  138  K  --  3.8  CL  --  107  CO2  --  22  GLUCOSE  --  98  BUN  --  18  CREATININE 0.86 0.71  CALCIUM  --  9.1   Liver Function Tests No results for input(s): AST, ALT, ALKPHOS, BILITOT, PROT, ALBUMIN in the last 72 hours. No results for input(s): LIPASE, AMYLASE in the last 72 hours. High Sensitivity Troponin:   No results for input(s): TROPONINIHS in the last 720 hours.  BNP Invalid input(s): POCBNP D-Dimer No results for input(s): DDIMER in the last 72 hours. Hemoglobin A1C No results for input(s): HGBA1C in the last 72 hours. Fasting Lipid Panel No results for input(s): CHOL, HDL, LDLCALC, TRIG, CHOLHDL, LDLDIRECT in the last 72 hours. Thyroid Function Tests No results for input(s): TSH, T4TOTAL, T3FREE, THYROIDAB in the last 72 hours.  Invalid input(s): FREET3 _____________  CARDIAC CATHETERIZATION  Result Date: 11/23/2019  Ost LAD to Prox LAD lesion is 90% stenosed.  Mid RCA lesion is 90% stenosed. 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  Mid RCA to Dist RCA lesion is 25% stenosed.  RPDA lesion is 40% stenosed.  Dist LAD lesion is 100% stenosed. Right to left collaterals present.  Ramus-1 lesion is 90% stenosed.  Ramus-2 lesion is 75% stenosed.  Ost Cx lesion is 75% stenosed.  Dist LM lesion is 25% stenosed.  Mid Cx lesion is 95% stenosed.  Mid LAD lesion is 100% stenosed. Left to left collaterals. Large parallel septal vessel.  The left ventricular ejection fraction is 45-50% by visual estimate. LVEDP 11 mm Hg.  There is no aortic valve stenosis.  Multivessel complex  calcific CAD. 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  Will get CVTS consult.   CT CORONARY MORPH W/CTA COR W/SCORE W/CA W/CM &/OR WO/CM  Addendum Date: 11/11/2019   ADDENDUM REPORT: 11/11/2019 16:58 HISTORY: Acute coronary syndrome (ACS)/MI, complication suspected EXAM: Cardiac/Coronary  CT TECHNIQUE: The patient was scanned on a [employer]. PROTOCOL: A 120 kV prospective scan was triggered in the descending thoracic aorta at 111 HU's. Axial non-contrast 3 mm slices were carried out through the heart. The data set was analyzed on a dedicated work station and scored using the [place]. Gantry rotation speed was 250 msecs and collimation was .6 mm. Beta blockade and 0.8 mg of sl NTG was given. The 3D data set was reconstructed in 5% intervals of the  67-82 % of the R-R cycle. Diastolic phases were analyzed on a dedicated work station using MPR, MIP and VRT modes. The patient received 25m OMNIPAQUE IOHEXOL 350 MG/ML SOLN of contrast. FINDINGS: Image quality: Average. Noise artifact is: Moderate. Coronary calcium score is 1054, which places the patient in the 93rd percentile for age and sex matched control. Coronary arteries: Normal coronary origins.  Right dominance. Right Coronary Artery: Mild mixed atherosclerotic plaque in the proximal RCA, 25-49% stenosis. Moderate mixed atherosclerotic plaque in the mid RCA and distal RCA, 50-69% stenosis. Patent PDA and PLA. Left Main Coronary Artery: Ostial to distal LMCA mixed atherosclerotic plaque with minimal stenosis, <25% stenosis. Left Anterior Descending Coronary Artery: Severe proximal LAD mixed but primarily atherosclerotic plaque, 70-99% stenosis. Severe mixed atherosclerotic disease in the mid LAD, 70-99% stenosis, at the bifurcation of the first diagonal. There is severe ostial first diagonal stenosis with mixed atherosclerotic plaque, with probable subtotal occlusion of the  first diagonal with distal recanalization. Ramus Intermedius: Severe atherosclerotic plaque in the proximal RI branch, 70-99% stenosis. Left Circumflex Artery:Moderate mixed atherosclerotic plaque in the proximal left circumflex artery, 50-69% stenosis. Aorta: Normal size, 30 mm at the mid ascending aorta (level of the PA bifurcation) measured double oblique. No calcifications. No dissection. Aortic Valve: Trivial annular calcifications. Other findings: Normal pulmonary vein drainage into the left atrium. Normal left atrial appendage without a thrombus. Normal size of the pulmonary artery. 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 No apical or mural thrombus. IMPRESSION: 1. Severe CAD in proximal LAD, ostial first diagonal, and proximal ramus intermedius, CADRADS = 5 due to total occlusion of first diagonal branch with distal recanalization. CT FFR will be performed and reported separately. 2. Coronary calcium score is 1054, which places the patient in the 93rd percentile for age and sex matched control. 3. 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 No apical or mural thrombus. CADRADS/RECOMMENDATIONS: CAD-RADS 5 Total coronary occlusion (100%). Consider cardiac catheterization or viability assessment. Consider symptom-guided anti-ischemic pharmacotherapy as well as risk factor modification per guideline directed care. Electronically Signed   By: GCherlynn Kaiser  On: 11/11/2019 16:58   Result Date: 11/11/2019 EXAM: OVER-READ INTERPRETATION  CT CHEST The following report is an over-read performed by radiologist Dr. KRolm Baptiseof [facility], [place]ort [place]on 11/08/2019. This over-read does not include interpretation of cardiac or coronary anatomy or pathology. The coronary CTA interpretation by the cardiologist is attached. COMPARISON:  None. FINDINGS: Vascular: Heart is normal size.  Aorta normal caliber. Mediastinum/Nodes: No adenopathy. Lungs/Pleura: Right base scarring or atelectasis. Small  subpleural nodule in the left lower lobe measuring 3 mm. No effusions. Upper Abdomen: Imaging into the upper abdomen demonstrates no acute findings. Musculoskeletal: Chest wall soft tissues are unremarkable. No acute bony abnormality. IMPRESSION: Right basilar scarring or atelectasis. 3 mm nodule at the left lung base. No follow-up needed if patient is low-risk. Non-contrast chest CT can be considered in 12 months if patient is high-risk. This recommendation follows the consensus statement: Guidelines for Management of Incidental Pulmonary Nodules Detected on CT Images: From the Fleischner Society 2017; Radiology 2017; 284:228-243. Electronically Signed: By: KRolm BaptiseM.D. On: 11/08/2019 16:13   DG Chest Port 1 View  Result Date: 11/25/2019 CLINICAL DATA:  Preop evaluation. EXAM: PORTABLE CHEST 1 VIEW COMPARISON:  None. FINDINGS: Heart size and pulmonary vascularity within normal limits. Moderate elevation right hemidiaphragm. Lungs are well aerated and clear without infiltrate or effusion. IMPRESSION: No active disease. Electronically Signed   By: CJuanda Crumble  Carlis Abbott M.D.   On: 11/25/2019 11:01   CT CORONARY FRACTIONAL FLOW RESERVE DATA PREP  Result Date: 11/11/2019 EXAM: CT FFR ANALYSIS CLINICAL DATA:  acute coronary syndrome complication suspected FINDINGS: FFRct analysis was performed on the original cardiac CT angiogram dataset. Diagrammatic representation of the FFRct analysis is provided in a separate PDF document in PACS. This dictation was created using the PDF document and an interactive 3D model of the results. 3D model is not available in the EMR/PACS. Normal FFR range is >0.80. Indeterminate (grey) zone is 0.76-0.80. 1. Left Main: FFR = 0.98 2. LAD: Proximal FFR = 0.75, Mid FFR = 0.69, Distal FFR = not mapped, First diagonal FFR = modeled as totally occluded. 3. RI: Proximal FFR = 0.66, Distal FFR = 0.61 4. LCX: Proximal FFR = 0.92, distal FFR = 0.85 5. RCA: Proximal FFR = 0.95, mid FFR =0.90,  Distal FFR = 0.89 IMPRESSION: 1. CT FFR analysis showed hemodynamically significant, severe stenosis in the proximal LAD, ostial first diagonal which is totally occluded, and proximal ramus intermedius artery. RECOMMENDATIONS: Recommend coronary angiography to further assess severe coronary artery disease. Guideline-directed medical therapy and aggressive risk factor modification for secondary prevention of coronary artery disease. Electronically Signed   By: Cherlynn Kaiser   On: 11/11/2019 17:03   VAS US DOPPLER PRE CABG  Result Date: 11/24/2019 PREOPERATIVE VASCULAR EVALUATION  Indications: Pre-CABG. Performing Technologist: Abram Sander RVS  Examination Guidelines: A complete evaluation includes B-mode imaging, spectral Doppler, color Doppler, and power Doppler as needed of all accessible portions of each vessel. Bilateral testing is considered an integral part of a complete examination. Limited examinations for reoccurring indications may be performed as noted.  Right Carotid Findings: +----------+--------+--------+--------+------------+--------+           PSV cm/sEDV cm/sStenosisDescribe    Comments +----------+--------+--------+--------+------------+--------+ CCA Prox  62      13                                   +----------+--------+--------+--------+------------+--------+ CCA Distal62      17              heterogenous         +----------+--------+--------+--------+------------+--------+ ICA Prox  88      36      1-39%   heterogenous         +----------+--------+--------+--------+------------+--------+ ICA Distal78      25                                   +----------+--------+--------+--------+------------+--------+ ECA       221     15                                   +----------+--------+--------+--------+------------+--------+ Portions of this table do not appear on this page. +----------+--------+-------+--------+------------+           PSV cm/sEDV  cmsDescribeArm Pressure +----------+--------+-------+--------+------------+ Subclavian81                                  +----------+--------+-------+--------+------------+ +---------+--------+--+--------+-+---------+ VertebralPSV cm/s40EDV cm/s7Antegrade +---------+--------+--+--------+-+---------+ Left Carotid Findings: +----------+--------+--------+--------+------------+--------+           PSV cm/sEDV cm/sStenosisDescribe    Comments +----------+--------+--------+--------+------------+--------+ CCA Prox  107  17              heterogenous         +----------+--------+--------+--------+------------+--------+ CCA Distal82      20              heterogenous         +----------+--------+--------+--------+------------+--------+ ICA Prox  83      29      1-39%   heterogenous         +----------+--------+--------+--------+------------+--------+ ICA Distal70      23                                   +----------+--------+--------+--------+------------+--------+ ECA       142     9                                    +----------+--------+--------+--------+------------+--------+ +----------+--------+--------+--------+------------+ SubclavianPSV cm/sEDV cm/sDescribeArm Pressure +----------+--------+--------+--------+------------+           100                                  +----------+--------+--------+--------+------------+ +---------+--------+--+--------+--+---------+ VertebralPSV cm/s41EDV cm/s12Antegrade +---------+--------+--+--------+--+---------+  ABI Findings: +--------+------------------+-----+---------+--------+ Right   Rt Pressure (mmHg)IndexWaveform Comment  +--------+------------------+-----+---------+--------+ Brachial                       triphasic         +--------+------------------+-----+---------+--------+ ATA                            triphasic         +--------+------------------+-----+---------+--------+  PTA                            triphasic         +--------+------------------+-----+---------+--------+ +--------+------------------+-----+---------+-------+ Left    Lt Pressure (mmHg)IndexWaveform Comment +--------+------------------+-----+---------+-------+ Brachial                       triphasic        +--------+------------------+-----+---------+-------+ ATA                            triphasic        +--------+------------------+-----+---------+-------+ PTA                            triphasic        +--------+------------------+-----+---------+-------+  Right Doppler Findings: +--------+--------+-----+---------+--------+ Site    PressureIndexDoppler  Comments +--------+--------+-----+---------+--------+ Brachial             triphasic         +--------+--------+-----+---------+--------+ Radial               triphasic         +--------+--------+-----+---------+--------+ Ulnar                triphasic         +--------+--------+-----+---------+--------+  Left Doppler Findings: +--------+--------+-----+---------+--------+ Site    PressureIndexDoppler  Comments +--------+--------+-----+---------+--------+ Brachial             triphasic         +--------+--------+-----+---------+--------+ Radial  triphasic         +--------+--------+-----+---------+--------+ Ulnar                triphasic         +--------+--------+-----+---------+--------+  Summary: Right Carotid: Velocities in the right ICA are consistent with a 1-39% stenosis. Left Carotid: Velocities in the left ICA are consistent with a 1-39% stenosis. Vertebrals: Bilateral vertebral arteries demonstrate antegrade flow. Right Upper Extremity: Doppler waveforms decrease 50% with right radial compression. Doppler waveforms remain within normal limits with right ulnar compression. Left Upper Extremity: Doppler waveforms decrease 50% with left radial compression. Doppler  waveform obliterate with left ulnar compression.  Electronically signed by Curt Jews MD on 11/24/2019 at 3:05:55 PM.    Final    Disposition   Pt is being discharged home today in good condition.  Follow-up Plans & Appointments     Follow-up Information    [facility] on 11[facility]/2019.   Why: please follow instructions given for CABG             Discharge Instructions    Call MD for:  redness, tenderness, or signs of infection (pain, swelling, redness, odor or green/yellow discharge around incision site)   Complete by: As directed    Diet - low sodium heart healthy   Complete by: As directed    Discharge instructions   Complete by: As directed    Radial Site Care Refer to this sheet in the next few weeks. These instructions provide you with information on caring for yourself after your procedure. Your caregiver may also give you more specific instructions. Your treatment has been planned according to current medical practices, but problems sometimes occur. Call your caregiver if you have any problems or questions after your procedure. HOME CARE INSTRUCTIONS You may shower the day after the procedure.Remove the bandage (dressing) and gently wash the site with plain soap and water.Gently pat the site dry.  Do not apply powder or lotion to the site.  Do not submerge the affected site in water for 3 to 5 days.  Inspect the site at least twice daily.  Do not flex or bend the affected arm for 24 hours.  No lifting over 5 pounds (2.3 kg) for 5 days after your procedure.  Do not drive home if you are discharged the same day of the procedure. Have someone else drive you.  You may drive 24 hours after the procedure unless otherwise instructed by your caregiver.  What to expect: Any bruising will usually fade within 1 to 2 weeks.  Blood that collects in the tissue (hematoma) may be painful to the touch. It should usually decrease in size and tenderness within 1 to 2  weeks.  SEEK IMMEDIATE MEDICAL CARE IF: You have unusual pain at the radial site.  You have redness, warmth, swelling, or pain at the radial site.  You have drainage (other than a small amount of blood on the dressing).  You have chills.  You have a fever or persistent symptoms for more than 72 hours.  You have a fever and your symptoms suddenly get worse.  Your arm becomes pale, cool, tingly, or numb.  You have heavy bleeding from the site. Hold pressure on the site.   Increase activity slowly   Complete by: As directed       Discharge Medications   Allergies as of 11/25/2019      Reactions   Mango Flavor Anaphylaxis   Contrast Media [iodinated Diagnostic  Agents] Other (See Comments)   Unknown reaction      Medication List    STOP taking these medications   diphenhydrAMINE 50 MG tablet Commonly known as: BENADRYL   ibuprofen 200 MG tablet Commonly known as: ADVIL   predniSONE 50 MG tablet Commonly known as: DELTASONE     TAKE these medications   aspirin EC 81 MG tablet Take 81 mg by mouth daily. Swallow whole.   atorvastatin 80 MG tablet Commonly known as: LIPITOR Take 1 tablet (80 mg total) by mouth daily.   carvedilol 6.25 MG tablet Commonly known as: COREG Take 1 tablet (6.25 mg total) by mouth 2 (two) times daily. What changed:   medication strength  how much to take   isosorbide mononitrate 30 MG 24 hr tablet Commonly known as: IMDUR Take 1 tablet (30 mg total) by mouth daily.   MULTIVITAMIN ADULT PO Take 1 tablet by mouth daily.   Nasacort Allergy 24HR 55 MCG/ACT Aero nasal inhaler Generic drug: triamcinolone Place 1 spray into the nose daily as needed (allergies).   PreserVision AREDS 2 Caps Take 2 capsules by mouth daily.   sertraline 25 MG tablet Commonly known as: ZOLOFT Take 1 tablet (25 mg total) by mouth daily. What changed:   when to take this  reasons to take this          Outstanding Labs/Studies   N/a  Duration of  Discharge Encounter   Greater than 30 minutes including physician time.  Signed, Reino Bellis, NP 11/25/2019, 11:44 AM

## 2019-11-24 NOTE — Progress Notes (Addendum)
Progress Note  Patient Name: Jamie Hodge Date of Encounter: 11/24/2019  [facility] Cardiologist: Elouise Munroe, MD   Subjective   Feeling well this morning.   Inpatient Medications    Scheduled Meds: . aspirin EC  81 mg Oral Daily  . atorvastatin  80 mg Oral Daily  . carvedilol  3.125 mg Oral BID  . heparin  5,000 Units Subcutaneous Q8H  . multivitamin  2 tablet Oral Daily  . sodium chloride flush  3 mL Intravenous Q12H   Continuous Infusions: . sodium chloride     PRN Meds: sodium chloride, acetaminophen, ondansetron (ZOFRAN) IV, sertraline, sodium chloride flush, triamcinolone   Vital Signs    Vitals:   11/23/19 1408 11/23/19 1622 11/23/19 2056 11/24/19 0543  BP: (!) 156/87 (!) 160/81 136/71 128/61  Pulse:  (!) 102 90 81  Resp: 19 20 18 16   Temp:  97.7 F (36.5 C) 97.7 F (36.5 C) 98.1 F (36.7 C)  TempSrc:  Oral Oral Oral  SpO2: 98% 94% 95% 96%  Weight:    84.1 kg  Height:        Intake/Output Summary (Last 24 hours) at 11/24/2019 0816 Last data filed at 11/23/2019 2005 Gross per 24 hour  Intake 240 ml  Output --  Net 240 ml   Last 3 Weights 11/24/2019 11/23/2019 11/15/2019  Weight (lbs) 185 lb 4.8 oz 185 lb 185 lb 9.6 oz  Weight (kg) 84.052 kg 83.915 kg 84.188 kg      Telemetry    SR with PVCs - Personally Reviewed  ECG    SR with PVC - Personally Reviewed  Physical Exam  Pleasant older female, sitting up in bed GEN: No acute distress.   Neck: No JVD Cardiac: RRR, no murmurs, rubs, or gallops.  Respiratory: Clear to auscultation bilaterally. GI: Soft, nontender, non-distended  MS: No edema; No deformity. Right radial cath site stable Neuro:  Nonfocal  Psych: Normal affect   Labs    High Sensitivity Troponin:  No results for input(s): TROPONINIHS in the last 720 hours.    Chemistry Recent Labs  Lab 11/23/19 1633  CREATININE 0.86  GFRNONAA >60     Hematology Recent Labs  Lab 11/23/19 1633  WBC 12.7*  RBC 4.75   HGB 14.1  HCT 42.3  MCV 89.1  MCH 29.7  MCHC 33.3  RDW 13.8  PLT 261    BNPNo results for input(s): BNP, PROBNP in the last 168 hours.   DDimer No results for input(s): DDIMER in the last 168 hours.   Radiology    CARDIAC CATHETERIZATION  Result Date: 11/23/2019  Ost LAD to Prox LAD lesion is 90% stenosed.  Mid RCA lesion is 90% stenosed. 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  Mid RCA to Dist RCA lesion is 25% stenosed.  RPDA lesion is 40% stenosed.  Dist LAD lesion is 100% stenosed. Right to left collaterals present.  Ramus-1 lesion is 90% stenosed.  Ramus-2 lesion is 75% stenosed.  Ost Cx lesion is 75% stenosed.  Dist LM lesion is 25% stenosed.  Mid Cx lesion is 95% stenosed.  Mid LAD lesion is 100% stenosed. Left to left collaterals. Large parallel septal vessel.  The left ventricular ejection fraction is 45-50% by visual estimate. LVEDP 11 mm Hg.  There is no aortic valve stenosis.  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  Will get CVTS consult.    Cardiac Studies  Cath: 11/23/19   Ost LAD to Prox LAD lesion is 90% stenosed.  Mid RCA lesion is 90% stenosed. 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  Mid RCA to Dist RCA lesion is 25% stenosed.  RPDA lesion is 40% stenosed.  Dist LAD lesion is 100% stenosed. Right to left collaterals present.  Ramus-1 lesion is 90% stenosed.  Ramus-2 lesion is 75% stenosed.  Ost Cx lesion is 75% stenosed.  Dist LM lesion is 25% stenosed.  Mid Cx lesion is 95% stenosed.  Mid LAD lesion is 100% stenosed. Left to left collaterals. Large parallel septal vessel.  The left ventricular ejection fraction is 45-50% by visual estimate. LVEDP 11 mm Hg.  There is no aortic valve stenosis.   Multivessel complex calcific CAD.  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  Will get CVTS consult.    Diagnostic Dominance: Right    Patient Profile     79 y.o. female with PMH of CVA who presented to the office for follow up after an abnormal CCTA while inpatient in the setting of acute CVA. Sent for outpatient cardiac cath.   Assessment & Plan    1. CAD: underwent cardiac cath noted above with multivessel complex calcified CAD. TCTS was consulted for CABG. No chest pain overnight.  -- continue ASA, statin and BB   2. HLD: LDL 258, started on high dose statin at office visit. Trig 335. Hgb A1c 5.8 -- Will add lovaza -- suspect she may need PCSK9 as an outpatient given notably high LDL  3. HTN: elevated last evening, but improved this morning.  -- further titrate coreg to 6.25mg  BID  For questions or updates, please contact [place] Please consult [email or web address] for contact info under   Signed, Reino Bellis, NP  11/24/2019, 8:16 AM    Patient seen, examined. Available data reviewed. Agree with findings, assessment, and plan as outlined by Reino Bellis, NP.  The patient is independently interviewed and examined.  I personally reviewed her cardiac catheterization films.  On my exam, she is alert, oriented, in no distress.  Lungs are clear, heart is regular rate and rhythm with no murmur gallop, abdomen is soft and nontender, extremities have no edema.  HEENT is normal.  Skin is warm and dry.  The patient has severe complex multivessel coronary artery disease.  She is not having rest pain or class IV symptoms.  I completely agree that coronary bypass surgery is indicated for treatment of her severe proximal multivessel disease.  We will discuss disposition with the surgical team.  If surgery is planned several days out, will likely discharge her home to return as an outpatient.  For now we will continue her current medical therapy while she undergoes pre-CABG testing and awaits cardiac surgical consultation.  Sherren Mocha, M.D. 11/24/2019 11:46 AM

## 2019-11-24 NOTE — Care Management CC44 (Signed)
Condition Code 44 Documentation Completed  Patient Details  Name: Jamie Hodge MRN: [id number] Date of Birth: 1941-01-08   Condition Code 44 given:  Yes Patient signature on Condition Code 44 notice:  Yes Documentation of 2 MD's agreement:   yes Code 44 added to claim:  Yes    Ninfa Meeker, RN 11/24/2019, 3:49 PM

## 2019-11-24 NOTE — Plan of Care (Signed)

## 2019-11-24 NOTE — Progress Notes (Signed)
Pre cabg has been completed.   Preliminary results in CV Proc.   Abram Sander 11/24/2019 11:28 AM

## 2019-11-24 NOTE — Care Management Obs Status (Signed)
[place] NOTIFICATION   Patient Details  Name: [name] MRN: [id number] Date of Birth: [birth year]   Medicare Observation Status Notification Given:  Yes    Ninfa Meeker, RN 11/24/2019, 3:48 PM

## 2019-11-25 ENCOUNTER — Other Ambulatory Visit: Payer: Self-pay | Admitting: *Deleted

## 2019-11-25 ENCOUNTER — Observation Stay (HOSPITAL_COMMUNITY): Payer: Medicare Other

## 2019-11-25 DIAGNOSIS — I25118 Atherosclerotic heart disease of native coronary artery with other forms of angina pectoris: Secondary | ICD-10-CM | POA: Diagnosis not present

## 2019-11-25 DIAGNOSIS — I2510 Atherosclerotic heart disease of native coronary artery without angina pectoris: Secondary | ICD-10-CM

## 2019-11-25 DIAGNOSIS — I209 Angina pectoris, unspecified: Secondary | ICD-10-CM

## 2019-11-25 LAB — URINALYSIS, ROUTINE W REFLEX MICROSCOPIC
Bilirubin Urine: NEGATIVE
Glucose, UA: NEGATIVE mg/dL
Hgb urine dipstick: NEGATIVE
Ketones, ur: NEGATIVE mg/dL
Nitrite: NEGATIVE
Protein, ur: NEGATIVE mg/dL
Specific Gravity, Urine: 1.003 — ABNORMAL LOW (ref 1.005–1.030)
pH: 6 (ref 5.0–8.0)

## 2019-11-25 LAB — BLOOD GAS, ARTERIAL
Acid-Base Excess: 0 mmol/L (ref 0.0–2.0)
Bicarbonate: 24 mmol/L (ref 20.0–28.0)
Drawn by: 331761
FIO2: 21
O2 Saturation: 91.6 %
Patient temperature: 36.7
pCO2 arterial: 37 mmHg (ref 32.0–48.0)
pH, Arterial: 7.426 (ref 7.350–7.450)
pO2, Arterial: 60.9 mmHg — ABNORMAL LOW (ref 83.0–108.0)

## 2019-11-25 LAB — BASIC METABOLIC PANEL
Anion gap: 9 (ref 5–15)
BUN: 18 mg/dL (ref 8–23)
CO2: 22 mmol/L (ref 22–32)
Calcium: 9.1 mg/dL (ref 8.9–10.3)
Chloride: 107 mmol/L (ref 98–111)
Creatinine, Ser: 0.71 mg/dL (ref 0.44–1.00)
GFR, Estimated: >60 mL/min (ref >60)
Glucose, Bld: 98 mg/dL (ref 70–99)
Potassium: 3.8 mmol/L (ref 3.5–5.1)
Sodium: 138 mmol/L (ref 135–145)

## 2019-11-25 LAB — SARS CORONAVIRUS 2 (TAT 6-24 HRS): SARS Coronavirus 2: NEGATIVE

## 2019-11-25 LAB — PROTIME-INR
INR: 1 (ref 0.8–1.2)
Prothrombin Time: 12.5 seconds (ref 11.4–15.2)

## 2019-11-25 LAB — CBC
HCT: 37.4 % (ref 36.0–46.0)
Hemoglobin: 12.3 g/dL (ref 12.0–15.0)
MCH: 29.9 pg (ref 26.0–34.0)
MCHC: 32.9 g/dL (ref 30.0–36.0)
MCV: 91 fL (ref 80.0–100.0)
Platelets: 237 10*3/uL (ref 150–400)
RBC: 4.11 MIL/uL (ref 3.87–5.11)
RDW: 14.4 % (ref 11.5–15.5)
WBC: 11.4 10*3/uL — ABNORMAL HIGH (ref 4.0–10.5)
nRBC: 0 % (ref 0.0–0.2)

## 2019-11-25 LAB — PULMONARY FUNCTION TEST
FEF 25-75 Pre: 1.37 L/sec
FEF2575-%Pred-Pre: 87 %
FEV1-%Pred-Pre: 79 %
FEV1-Pre: 1.62 L
FEV1FVC-%Pred-Pre: 103 %
FEV6-%Pred-Pre: 80 %
FEV6-Pre: 2.09 L
FEV6FVC-%Pred-Pre: 104 %
FVC-%Pred-Pre: 76 %
FVC-Pre: 2.1 L
Pre FEV1/FVC ratio: 77 %
Pre FEV6/FVC Ratio: 100 %

## 2019-11-25 LAB — APTT: aPTT: 31 seconds (ref 24–36)

## 2019-11-25 LAB — SURGICAL PCR SCREEN
MRSA, PCR: NEGATIVE
Staphylococcus aureus: NEGATIVE

## 2019-11-25 MED ORDER — ICOSAPENT ETHYL 1 G PO CAPS: 2.0000 g | ORAL_CAPSULE | Freq: Two times a day (BID) | ORAL | 3 refills | Status: DC

## 2019-11-25 MED ORDER — CARVEDILOL 6.25 MG PO TABS
6.2500 mg | ORAL_TABLET | Freq: Two times a day (BID) | ORAL | 0 refills | Status: DC
Start: 2019-11-25 — End: 2019-12-06

## 2019-11-25 MED ORDER — MUPIROCIN 2 % EX OINT: 1.0000 "application " | TOPICAL_OINTMENT | Freq: Two times a day (BID) | CUTANEOUS | Status: DC

## 2019-11-25 MED ORDER — ISOSORBIDE MONONITRATE ER 30 MG PO TB24
30.0000 mg | ORAL_TABLET | Freq: Every day | ORAL | Status: DC
  Administered 2019-11-25: 30 mg via ORAL
  Filled 2019-11-25: qty 1

## 2019-11-25 MED ORDER — ISOSORBIDE MONONITRATE ER 30 MG PO TB24
30.0000 mg | ORAL_TABLET | Freq: Every day | ORAL | 0 refills | Status: DC
Start: 2019-11-25 — End: 2019-12-06

## 2019-11-25 MED FILL — ISOSORBIDE MN ER 30 MG TAB: 30 | 30 days supply | Qty: 30 | Fill #0

## 2019-11-25 MED FILL — CARVEDILOL 6.25 MG TABLET: 6.25 | 30 days supply | Qty: 60 | Fill #0

## 2019-11-25 NOTE — Progress Notes (Signed)
Discharge teaching complete. Meds, diet, activity, follow up appointments and TR band site care reviewed and all questions answered. Copy of instructions given to patient and meds brought to room by [facility] pharmacy.

## 2019-11-25 NOTE — Progress Notes (Addendum)
Progress Note  Patient Name: Jamie Hodge Date of Encounter: 11/25/2019  [facility] Cardiologist: Elouise Munroe, MD   Subjective   Feeling well. No chest pain overnight.   Inpatient Medications    Scheduled Meds: . aspirin EC  81 mg Oral Daily  . atorvastatin  80 mg Oral Daily  . carvedilol  6.25 mg Oral BID  . heparin  5,000 Units Subcutaneous Q8H  . icosapent Ethyl  2 g Oral BID  . multivitamin  2 tablet Oral Daily  . sodium chloride flush  3 mL Intravenous Q12H   Continuous Infusions: . sodium chloride     PRN Meds: sodium chloride, acetaminophen, ondansetron (ZOFRAN) IV, sertraline, sodium chloride flush, triamcinolone   Vital Signs    Vitals:   11/24/19 2000 11/24/19 2001 11/25/19 0614 11/25/19 0618  BP:  133/61 133/71   Pulse: 71     Resp:  18 18   Temp: (!) 97.2 F (36.2 C)  98.1 F (36.7 C)   TempSrc: Oral  Oral   SpO2: 96%  95%   Weight:    83.9 kg  Height:       No intake or output data in the 24 hours ending 11/25/19 0806 Last 3 Weights 11/25/2019 11/24/2019 11/23/2019  Weight (lbs) 184 lb 14.4 oz 185 lb 4.8 oz 185 lb  Weight (kg) 83.87 kg 84.052 kg 83.915 kg      Telemetry    SR with occ PVC - Personally Reviewed  ECG    No new tracing  Physical Exam  Pleasant older female GEN: No acute distress.   Neck: No JVD Cardiac: RRR, no murmurs, rubs, or gallops.  Respiratory: Clear to auscultation bilaterally. GI: Soft, nontender, non-distended  MS: No edema; No deformity. Right radial cath site stable.  Neuro:  Nonfocal  Psych: Normal affect   Labs    High Sensitivity Troponin:  No results for input(s): TROPONINIHS in the last 720 hours.    Chemistry Recent Labs  Lab 11/23/19 1633 11/25/19 0144  NA  --  138  K  --  3.8  CL  --  107  CO2  --  22  GLUCOSE  --  98  BUN  --  18  CREATININE 0.86 0.71  CALCIUM  --  9.1  GFRNONAA >60 >60  ANIONGAP  --  9     Hematology Recent Labs  Lab 11/23/19 1633  11/25/19 0144  WBC 12.7* 11.4*  RBC 4.75 4.11  HGB 14.1 12.3  HCT 42.3 37.4  MCV 89.1 91.0  MCH 29.7 29.9  MCHC 33.3 32.9  RDW 13.8 14.4  PLT 261 237    BNPNo results for input(s): BNP, PROBNP in the last 168 hours.   DDimer No results for input(s): DDIMER in the last 168 hours.   Radiology    CARDIAC CATHETERIZATION  Result Date: 11/23/2019  Ost LAD to Prox LAD lesion is 90% stenosed.  Mid RCA lesion is 90% stenosed. 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  Mid RCA to Dist RCA lesion is 25% stenosed.  RPDA lesion is 40% stenosed.  Dist LAD lesion is 100% stenosed. Right to left collaterals present.  Ramus-1 lesion is 90% stenosed.  Ramus-2 lesion is 75% stenosed.  Ost Cx lesion is 75% stenosed.  Dist LM lesion is 25% stenosed.  Mid Cx lesion is 95% stenosed.  Mid LAD lesion is 100% stenosed. Left to left collaterals. Large parallel septal vessel.  The left ventricular ejection fraction is 45-50% by visual estimate.  LVEDP 11 mm Hg.  There is no aortic valve stenosis.  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  Will get CVTS consult.   VAS US DOPPLER PRE CABG  Result Date: 11/24/2019 PREOPERATIVE VASCULAR EVALUATION  Indications: Pre-CABG. Performing Technologist: Abram Sander RVS  Examination Guidelines: A complete evaluation includes B-mode imaging, spectral Doppler, color Doppler, and power Doppler as needed of all accessible portions of each vessel. Bilateral testing is considered an integral part of a complete examination. Limited examinations for reoccurring indications may be performed as noted.  Right Carotid Findings: +----------+--------+--------+--------+------------+--------+           PSV cm/sEDV cm/sStenosisDescribe    Comments +----------+--------+--------+--------+------------+--------+ CCA Prox  62      13                                    +----------+--------+--------+--------+------------+--------+ CCA Distal62      17              heterogenous         +----------+--------+--------+--------+------------+--------+ ICA Prox  88      36      1-39%   heterogenous         +----------+--------+--------+--------+------------+--------+ ICA Distal78      25                                   +----------+--------+--------+--------+------------+--------+ ECA       221     15                                   +----------+--------+--------+--------+------------+--------+ Portions of this table do not appear on this page. +----------+--------+-------+--------+------------+           PSV cm/sEDV cmsDescribeArm Pressure +----------+--------+-------+--------+------------+ Subclavian81                                  +----------+--------+-------+--------+------------+ +---------+--------+--+--------+-+---------+ VertebralPSV cm/s40EDV cm/s7Antegrade +---------+--------+--+--------+-+---------+ Left Carotid Findings: +----------+--------+--------+--------+------------+--------+           PSV cm/sEDV cm/sStenosisDescribe    Comments +----------+--------+--------+--------+------------+--------+ CCA Prox  107     17              heterogenous         +----------+--------+--------+--------+------------+--------+ CCA Distal82      20              heterogenous         +----------+--------+--------+--------+------------+--------+ ICA Prox  83      29      1-39%   heterogenous         +----------+--------+--------+--------+------------+--------+ ICA Distal70      23                                   +----------+--------+--------+--------+------------+--------+ ECA       142     9                                    +----------+--------+--------+--------+------------+--------+ +----------+--------+--------+--------+------------+ SubclavianPSV cm/sEDV cm/sDescribeArm  Pressure  +----------+--------+--------+--------+------------+           100                                  +----------+--------+--------+--------+------------+ +---------+--------+--+--------+--+---------+ VertebralPSV cm/s41EDV cm/s12Antegrade +---------+--------+--+--------+--+---------+  ABI Findings: +--------+------------------+-----+---------+--------+ Right   Rt Pressure (mmHg)IndexWaveform Comment  +--------+------------------+-----+---------+--------+ Brachial                       triphasic         +--------+------------------+-----+---------+--------+ ATA                            triphasic         +--------+------------------+-----+---------+--------+ PTA                            triphasic         +--------+------------------+-----+---------+--------+ +--------+------------------+-----+---------+-------+ Left    Lt Pressure (mmHg)IndexWaveform Comment +--------+------------------+-----+---------+-------+ Brachial                       triphasic        +--------+------------------+-----+---------+-------+ ATA                            triphasic        +--------+------------------+-----+---------+-------+ PTA                            triphasic        +--------+------------------+-----+---------+-------+  Right Doppler Findings: +--------+--------+-----+---------+--------+ Site    PressureIndexDoppler  Comments +--------+--------+-----+---------+--------+ Brachial             triphasic         +--------+--------+-----+---------+--------+ Radial               triphasic         +--------+--------+-----+---------+--------+ Ulnar                triphasic         +--------+--------+-----+---------+--------+  Left Doppler Findings: +--------+--------+-----+---------+--------+ Site    PressureIndexDoppler  Comments +--------+--------+-----+---------+--------+ Brachial             triphasic          +--------+--------+-----+---------+--------+ Radial               triphasic         +--------+--------+-----+---------+--------+ Ulnar                triphasic         +--------+--------+-----+---------+--------+  Summary: Right Carotid: Velocities in the right ICA are consistent with a 1-39% stenosis. Left Carotid: Velocities in the left ICA are consistent with a 1-39% stenosis. Vertebrals: Bilateral vertebral arteries demonstrate antegrade flow. Right Upper Extremity: Doppler waveforms decrease 50% with right radial compression. Doppler waveforms remain within normal limits with right ulnar compression. Left Upper Extremity: Doppler waveforms decrease 50% with left radial compression. Doppler waveform obliterate with left ulnar compression.  Electronically signed by Curt Jews MD on 11/24/2019 at 3:05:55 PM.    Final     Cardiac Studies   Cath: 11/23/19   Ost LAD to Prox LAD lesion is 90% stenosed.  Mid RCA lesion is 90% stenosed. 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  Mid RCA to Dist RCA lesion is 25% stenosed.  RPDA lesion  is 40% stenosed.  Dist LAD lesion is 100% stenosed. Right to left collaterals present.  Ramus-1 lesion is 90% stenosed.  Ramus-2 lesion is 75% stenosed.  Ost Cx lesion is 75% stenosed.  Dist LM lesion is 25% stenosed.  Mid Cx lesion is 95% stenosed.  Mid LAD lesion is 100% stenosed. Left to left collaterals. Large parallel septal vessel.  The left ventricular ejection fraction is 45-50% by visual estimate. LVEDP 11 mm Hg.  There is no aortic valve stenosis.  Multivessel complex calcific CAD.  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 Will get CVTS consult.   Diagnostic Dominance: Right   Patient Profile     79 y.o. female  with PMH of CVA who presented to the office for follow up after an abnormal CCTA while inpatient in the setting of acute CVA. Sent for outpatient cardiac cath.    Assessment & Plan    1. CAD: underwent cardiac cath noted above with multivessel complex calcified CAD. TCTS was consulted for CABG. Planned for Monday 11/8. No chest pain post cath. Seen by CR.  -- continue ASA, statin and BB. Add Imdur 30mg  daily.  2. HLD: LDL 258, started on high dose statin at office visit. Trig 335. Hgb A1c 5.8. Added Vascepa 2g BID  3. HTN: stable with coreg 6.25mg  BID.   Planned for discharge today once all pre op testing and education completed. Patient is instructed on limitations once going home in anticipation for upcoming CABG.   For questions or updates, please contact [place] Please consult [email or web address] for contact info under   Signed, Reino Bellis, NP  11/25/2019, 8:06 AM    Patient seen, examined. Available data reviewed. Agree with findings, assessment, and plan as outlined by Reino Bellis, NP.  The patient is independently interviewed and examined.  She is alert, oriented, and in no acute distress.  JVP is normal, lungs are clear to auscultation bilaterally, heart is regular rate and rhythm with no murmur or gallop, abdomen is soft and nontender, extremities have no edema.  The patient remains clinically stable with no resting angina.  She has been evaluated by cardiac surgery with plans for outpatient CABG on Monday.  I reviewed with her the importance of avoiding any moderate or strenuous physical activity when she is home.  She understands and will just take it easy over the weekend.  She understands to seek immediate medical attention if she has any resting anginal symptoms.  She otherwise appears stable for hospital discharge.  I have reviewed her medications which include aspirin, a statin drug, a beta-blocker, and isosorbide has been added.  Once all of her preoperative surgical testing is completed, she is medically stable for discharge home.  Sherren Mocha, M.D. 11/25/2019 11:20 AM

## 2019-11-25 NOTE — TOC Benefit Eligibility Note (Signed)
Transition of Care [facility]) Benefit Eligibility Note    Patient Details  Name: Jamie Hodge MRN: [id number] Date of Birth: 04/05/1940   Medication/Dose: [name]  Covered?: Yes     Prescription Coverage [facility]erred Pharmacy: [facility]  Spoke with Person/Company/Phone Number:: [facility]  @  [place] AA #  [phone]  Co-Pay: $100.00  Prior Approval: No  Deductible: Unmet (OUT-OF-POCKET: Linton Ham Phone Number: 11/25/2019, 11:48 AM

## 2019-11-25 NOTE — Progress Notes (Signed)
Ria Comment, NP notified about patient having 16 beat run of SVT. Ok to still discharge patient.

## 2019-11-25 NOTE — Progress Notes (Addendum)
Discussed sternal precautions, IS (1500 mL), mobility post op, and d/c planning. Pt receptive. Her husband will be with her at d/c. Encouraged her to do light activity over the weekend and practice IS and sternal precautions. She is currently watching preop video and has education materials to read.  1020-1040 Yves Dill CES, ACSM 10:38 AM 11/25/2019   While watching preop video pt had run of SVT vs afib for 2 sec. Pt asx. Resumed NSR. Rhythm saved on monitor at desk. Yves Dill CES, ACSM 10:51 AM 11/25/2019

## 2019-11-26 ENCOUNTER — Other Ambulatory Visit: Payer: Self-pay

## 2019-11-26 ENCOUNTER — Encounter (HOSPITAL_COMMUNITY): Payer: Self-pay | Admitting: Cardiothoracic Surgery

## 2019-11-26 DIAGNOSIS — G459 Transient cerebral ischemic attack, unspecified: Secondary | ICD-10-CM

## 2019-11-26 DIAGNOSIS — I2510 Atherosclerotic heart disease of native coronary artery without angina pectoris: Secondary | ICD-10-CM

## 2019-11-26 MED ORDER — SODIUM CHLORIDE 0.9 % IV SOLN
INTRAVENOUS | Status: DC
  Filled 2019-11-26: qty 30

## 2019-11-26 MED ORDER — TRANEXAMIC ACID 1000 MG/10ML IV SOLN
1.5000 mg/kg/h | INTRAVENOUS | Status: AC
  Administered 2019-11-29: 1.5 mg/kg/h via INTRAVENOUS
  Filled 2019-11-26: qty 25

## 2019-11-26 MED ORDER — MAGNESIUM SULFATE 50 % IJ SOLN
40.0000 meq | INTRAMUSCULAR | Status: DC
  Filled 2019-11-26: qty 9.85

## 2019-11-26 MED ORDER — NOREPINEPHRINE 4 MG/250ML-% IV SOLN
0.0000 ug/min | INTRAVENOUS | Status: DC
  Filled 2019-11-26: qty 250

## 2019-11-26 MED ORDER — SODIUM CHLORIDE 0.9 % IV SOLN
750.0000 mg | INTRAVENOUS | Status: AC
  Administered 2019-11-29: 750 mg via INTRAVENOUS
  Filled 2019-11-26: qty 750

## 2019-11-26 MED ORDER — PLASMA-LYTE 148 IV SOLN
INTRAVENOUS | Status: DC
  Filled 2019-11-26: qty 2.5

## 2019-11-26 MED ORDER — VANCOMYCIN HCL 1250 MG/250ML IV SOLN
1250.0000 mg | INTRAVENOUS | Status: AC
  Administered 2019-11-29: 1250 mg via INTRAVENOUS
  Filled 2019-11-26: qty 250

## 2019-11-26 MED ORDER — POTASSIUM CHLORIDE 2 MEQ/ML IV SOLN
80.0000 meq | INTRAVENOUS | Status: DC
  Filled 2019-11-26: qty 40

## 2019-11-26 MED ORDER — PHENYLEPHRINE HCL-NACL 20-0.9 MG/250ML-% IV SOLN
30.0000 ug/min | INTRAVENOUS | Status: AC
  Administered 2019-11-29: 45 ug/min via INTRAVENOUS
  Administered 2019-11-29 (×2): 25 ug/min via INTRAVENOUS
  Filled 2019-11-26: qty 250

## 2019-11-26 MED ORDER — NITROGLYCERIN IN D5W 200-5 MCG/ML-% IV SOLN
2.0000 ug/min | INTRAVENOUS | Status: AC
  Administered 2019-11-29: 16.6 ug/min via INTRAVENOUS
  Administered 2019-11-29: 5 ug/min via INTRAVENOUS
  Filled 2019-11-26: qty 250

## 2019-11-26 MED ORDER — TRANEXAMIC ACID (OHS) PUMP PRIME SOLUTION
2.0000 mg/kg | INTRAVENOUS | Status: DC
  Filled 2019-11-26: qty 1.68

## 2019-11-26 MED ORDER — TRANEXAMIC ACID (OHS) BOLUS VIA INFUSION
15.0000 mg/kg | INTRAVENOUS | Status: AC
  Administered 2019-11-29: 1258.5 mg via INTRAVENOUS
  Filled 2019-11-26: qty 1259

## 2019-11-26 MED ORDER — MILRINONE LACTATE IN DEXTROSE 20-5 MG/100ML-% IV SOLN
0.3000 ug/kg/min | INTRAVENOUS | Status: DC
  Filled 2019-11-26: qty 100

## 2019-11-26 MED ORDER — INSULIN REGULAR(HUMAN) IN NACL 100-0.9 UT/100ML-% IV SOLN
INTRAVENOUS | Status: AC
  Administered 2019-11-29: 1.4 [IU]/h via INTRAVENOUS
  Filled 2019-11-26: qty 100

## 2019-11-26 MED ORDER — DEXMEDETOMIDINE HCL IN NACL 400 MCG/100ML IV SOLN
0.1000 ug/kg/h | INTRAVENOUS | Status: AC
  Administered 2019-11-29: .4 ug/kg/h via INTRAVENOUS
  Filled 2019-11-26: qty 100

## 2019-11-26 MED ORDER — EPINEPHRINE HCL 5 MG/250ML IV SOLN IN NS
0.0000 ug/min | INTRAVENOUS | Status: DC
  Filled 2019-11-26: qty 250

## 2019-11-26 MED ORDER — SODIUM CHLORIDE 0.9 % IV SOLN
1.5000 g | INTRAVENOUS | Status: AC
  Administered 2019-11-29: 1.5 g via INTRAVENOUS
  Filled 2019-11-26: qty 1.5

## 2019-11-26 NOTE — Consult Note (Signed)
[address]       [place] [address]             [phone]        Evalyn A Marcell Brookneal Medical Record #[id number] Date of Birth: 12/14/1940  Referring: No ref. provider found Primary Care: [facility] Primary Cardiologist:Gayatri Stann Mainland, MD  Chief Complaint:   No chief complaint on file. h/o tia  History of Present Illness:     79 yo lady experienced right UE weakness and slurred speech in Sept 2021. 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 This was followed with CT coronary suggesting severe CAD, confirmed on LHC this past week. She was admitted for with plan for surgery but was medically stabilized and sent home. Now represents for CABG surgery. 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  Current Activity/ Functional Status: Patient will be independent with mobility/ambulation, transfers, ADL's, IADL's.   Zubrod Score: At the time of surgery this patients most appropriate activity status/level should be described as: []     0    Normal activity, no symptoms [x]     1    Restricted in physical strenuous activity but ambulatory, able to do out light work []     2    Ambulatory and capable of self care, unable to do work activities, up and about                 more than 50%  Of the time                            []     3    Only limited self care, in bed greater than 50% of waking hours []     4    Completely disabled, no self care, confined to bed or chair []     5    Moribund  Past Medical History:  Diagnosis Date   CVA (cerebral vascular accident) ([place])    Herpes zoster 2012   Vertigo     Past Surgical History:  Procedure Laterality Date   LEFT HEART CATH AND CORONARY ANGIOGRAPHY N/A 11/23/2019   Procedure: LEFT HEART CATH AND CORONARY ANGIOGRAPHY;  Surgeon: Jettie Booze, MD;  Location: [facility];  Service: Cardiovascular;  Laterality: N/A;    RHINOPLASTY      Social History   Tobacco Use  Smoking Status Never Smoker  Smokeless Tobacco Never Used    Social History   Substance and Sexual Activity  Alcohol Use No     Allergies  Allergen Reactions   Mango Flavor Anaphylaxis   Contrast Media [Iodinated Diagnostic Agents] Other (See Comments)    Unknown reaction    No current facility-administered medications for this encounter.   Current Outpatient Medications  Medication Sig Dispense Refill   aspirin EC 81 MG tablet Take 81 mg by mouth daily. Swallow whole.     atorvastatin (LIPITOR) 80 MG tablet Take 1 tablet (80 mg total) by mouth daily. 90 tablet 3   carvedilol (COREG) 6.25 MG tablet Take 1 tablet (6.25 mg total) by mouth 2 (two) times daily. 60 tablet 0   isosorbide mononitrate (IMDUR) 30 MG 24 hr tablet Take 1 tablet (30 mg total) by mouth daily. 30 tablet 0   Multiple Vitamin (MULTIVITAMIN ADULT PO) Take 1 tablet by mouth daily.     Multiple Vitamins-Minerals (PRESERVISION AREDS  2) CAPS Take 2 capsules by mouth daily.     sertraline (ZOLOFT) 25 MG tablet Take 1 tablet (25 mg total) by mouth daily. (Patient taking differently: Take 25 mg by mouth daily as needed (anxiety). ) 90 tablet 3   triamcinolone (NASACORT ALLERGY 24HR) 55 MCG/ACT AERO nasal inhaler Place 1 spray into the nose daily as needed (allergies).      No medications prior to admission.    Family History  Problem Relation Age of Onset   Unexplained death Mother        Died at the age of 6, no autopsy performed   CAD Father 11   Lung cancer Father 23     Review of Systems:   ROS Pertinent items are noted in HPI.     Cardiac Review of Systems: Y or  [    ]= no  Chest Pain [    ]  Resting SOB [   ] Exertional SOB  [  ]  Orthopnea [  ]   Pedal Edema [   ]    Palpitations [  ] Syncope  [  ]   Presyncope [   ]  General Review of Systems: [Y] = yes [  ]=no Constitional: recent weight change [  ]; anorexia [  ]; fatigue [  ];  nausea [  ]; night sweats [  ]; fever [  ]; or chills [  ]                                                               Dental: Last Dentist visit:   Eye : blurred vision [  ]; diplopia [   ]; vision changes [  ];  Amaurosis fugax[  ]; Resp: cough [  ];  wheezing[  ];  hemoptysis[  ]; shortness of breath[  ]; paroxysmal nocturnal dyspnea[  ]; dyspnea on exertion[  ]; or orthopnea[  ];  GI:  gallstones[  ], vomiting[  ];  dysphagia[  ]; melena[  ];  hematochezia [  ]; heartburn[  ];   Hx of  Colonoscopy[  ]; GU: kidney stones [  ]; hematuria[  ];   dysuria [  ];  nocturia[  ];  history of     obstruction [  ]; urinary frequency [  ]             Skin: rash, swelling[  ];, hair loss[  ];  peripheral edema[  ];  or itching[  ]; Musculosketetal: myalgias[  ];  joint swelling[  ];  joint erythema[  ];  joint pain[  ];  back pain[  ];  Heme/Lymph: bruising[  ];  bleeding[  ];  anemia[  ];  Neuro: TIA[  ];  headaches[  ];  stroke[  ];  vertigo[  ];  seizures[  ];   paresthesias[  ];  difficulty walking[  ];  Psych:depression[  ]; anxiety[  ];  Endocrine: diabetes[  ];  thyroid dysfunction[  ];        Physical Exam: There were no vitals taken for this visit.   General appearance: alert and cooperative Head: Normocephalic, without obvious abnormality, atraumatic Resp: clear to auscultation bilaterally Cardio: regular rate and rhythm, S1, S2 normal, no murmur, click, rub or  gallop GI: soft, non-tender; bowel sounds normal; no masses,  no organomegaly Extremities: extremities normal, atraumatic, no cyanosis or edema Neurologic: Alert and oriented X 3, normal strength and tone. Normal symmetric reflexes. Normal coordination and gait  Diagnostic Studies & Laboratory data:     Recent Radiology Findings:   DG Chest Port 1 View  Result Date: 11/25/2019 CLINICAL DATA:  Preop evaluation. EXAM: PORTABLE CHEST 1 VIEW COMPARISON:  None. FINDINGS: Heart size and pulmonary vascularity within normal limits.  Moderate elevation right hemidiaphragm. Lungs are well aerated and clear without infiltrate or effusion. IMPRESSION: No active disease. Electronically Signed   By: Franchot Gallo M.D.   On: 11/25/2019 11:01   VAS US DOPPLER PRE CABG  Result Date: 11/24/2019 PREOPERATIVE VASCULAR EVALUATION  Indications: Pre-CABG. Performing Technologist: Abram Sander RVS  Examination Guidelines: A complete evaluation includes B-mode imaging, spectral Doppler, color Doppler, and power Doppler as needed of all accessible portions of each vessel. Bilateral testing is considered an integral part of a complete examination. Limited examinations for reoccurring indications may be performed as noted.  Right Carotid Findings: +----------+--------+--------+--------+------------+--------+             PSV cm/s EDV cm/s Stenosis Describe     Comments  +----------+--------+--------+--------+------------+--------+  CCA Prox   62       13                                       +----------+--------+--------+--------+------------+--------+  CCA Distal 62       17                heterogenous           +----------+--------+--------+--------+------------+--------+  ICA Prox   88       36       1-39%    heterogenous           +----------+--------+--------+--------+------------+--------+  ICA Distal 78       25                                       +----------+--------+--------+--------+------------+--------+  ECA        221      15                                       +----------+--------+--------+--------+------------+--------+ Portions of this table do not appear on this page. +----------+--------+-------+--------+------------+             PSV cm/s EDV cms Describe Arm Pressure  +----------+--------+-------+--------+------------+  Subclavian 81                                      +----------+--------+-------+--------+------------+ +---------+--------+--+--------+-+---------+  Vertebral PSV cm/s 40 EDV cm/s 7 Antegrade   +---------+--------+--+--------+-+---------+ Left Carotid Findings: +----------+--------+--------+--------+------------+--------+             PSV cm/s EDV cm/s Stenosis Describe     Comments  +----------+--------+--------+--------+------------+--------+  CCA Prox   107      17                heterogenous           +----------+--------+--------+--------+------------+--------+  CCA  Distal 82       20                heterogenous           +----------+--------+--------+--------+------------+--------+  ICA Prox   83       29       1-39%    heterogenous           +----------+--------+--------+--------+------------+--------+  ICA Distal 70       23                                       +----------+--------+--------+--------+------------+--------+  ECA        142      9                                        +----------+--------+--------+--------+------------+--------+ +----------+--------+--------+--------+------------+  Subclavian PSV cm/s EDV cm/s Describe Arm Pressure  +----------+--------+--------+--------+------------+             100                                      +----------+--------+--------+--------+------------+ +---------+--------+--+--------+--+---------+  Vertebral PSV cm/s 41 EDV cm/s 12 Antegrade  +---------+--------+--+--------+--+---------+  ABI Findings: +--------+------------------+-----+---------+--------+  Right    Rt Pressure (mmHg) Index Waveform  Comment   +--------+------------------+-----+---------+--------+  Brachial                          triphasic           +--------+------------------+-----+---------+--------+  ATA                               triphasic           +--------+------------------+-----+---------+--------+  PTA                               triphasic           +--------+------------------+-----+---------+--------+ +--------+------------------+-----+---------+-------+  Left     Lt Pressure (mmHg) Index Waveform  Comment  +--------+------------------+-----+---------+-------+   Brachial                          triphasic          +--------+------------------+-----+---------+-------+  ATA                               triphasic          +--------+------------------+-----+---------+-------+  PTA                               triphasic          +--------+------------------+-----+---------+-------+  Right Doppler Findings: +--------+--------+-----+---------+--------+  Site     Pressure Index Doppler   Comments  +--------+--------+-----+---------+--------+  Brachial                triphasic           +--------+--------+-----+---------+--------+  Radial  triphasic           +--------+--------+-----+---------+--------+  Ulnar                   triphasic           +--------+--------+-----+---------+--------+  Left Doppler Findings: +--------+--------+-----+---------+--------+  Site     Pressure Index Doppler   Comments  +--------+--------+-----+---------+--------+  Brachial                triphasic           +--------+--------+-----+---------+--------+  Radial                  triphasic           +--------+--------+-----+---------+--------+  Ulnar                   triphasic           +--------+--------+-----+---------+--------+  Summary: Right Carotid: Velocities in the right ICA are consistent with a 1-39% stenosis. Left Carotid: Velocities in the left ICA are consistent with a 1-39% stenosis. Vertebrals: Bilateral vertebral arteries demonstrate antegrade flow. Right Upper Extremity: Doppler waveforms decrease 50% with right radial compression. Doppler waveforms remain within normal limits with right ulnar compression. Left Upper Extremity: Doppler waveforms decrease 50% with left radial compression. Doppler waveform obliterate with left ulnar compression.  Electronically signed by Curt Jews MD on 11/24/2019 at 3:05:55 PM.    Final      I have independently reviewed the above radiologic studies and discussed with the patient, including the results of her Left heart  catheterization and coronary angiogram.   Recent Lab Findings: Lab Results  Component Value Date   WBC 11.4 (H) 11/25/2019   HGB 12.3 11/25/2019   HCT 37.4 11/25/2019   PLT 237 11/25/2019   GLUCOSE 98 11/25/2019   CHOL 335 (H) 09/25/2019   TRIG 226 (H) 09/25/2019   HDL 32 (L) 09/25/2019   LDLCALC 258 (H) 09/25/2019   NA 138 11/25/2019   K 3.8 11/25/2019   CL 107 11/25/2019   CREATININE 0.71 11/25/2019   BUN 18 11/25/2019   CO2 22 11/25/2019   INR 1.0 11/25/2019   HGBA1C 5.8 (H) 09/25/2019      Assessment / Plan:      79 yo lady with multivessel CAD discovered somewhat incidentally after TIA. She is referred for CABG and is considered an excellent candidate. Will proceed on 11/29/19.    I  spent 30 minutes counseling the patient face to face.   Neli Fofana Z. Orvan Seen, MD [phone] 11/26/2019 9:38 AM

## 2019-11-28 ENCOUNTER — Encounter (HOSPITAL_COMMUNITY): Payer: Self-pay | Admitting: Cardiothoracic Surgery

## 2019-11-29 ENCOUNTER — Inpatient Hospital Stay (HOSPITAL_COMMUNITY)
Admission: RE | Admit: 2019-11-29 | Discharge: 2019-12-06 | DRG: 235 | Disposition: A | Discharge status: Home / Self Care | Payer: Medicare Other | Attending: Cardiothoracic Surgery | Admitting: Cardiothoracic Surgery

## 2019-11-29 ENCOUNTER — Inpatient Hospital Stay (HOSPITAL_COMMUNITY): Payer: Medicare Other

## 2019-11-29 ENCOUNTER — Encounter (HOSPITAL_COMMUNITY): Payer: Self-pay | Admitting: Cardiothoracic Surgery

## 2019-11-29 ENCOUNTER — Inpatient Hospital Stay (HOSPITAL_COMMUNITY): Payer: Medicare Other | Admitting: Physician Assistant

## 2019-11-29 ENCOUNTER — Other Ambulatory Visit: Payer: Self-pay

## 2019-11-29 ENCOUNTER — Inpatient Hospital Stay (HOSPITAL_COMMUNITY)
Admission: RE | Disposition: A | Discharge status: Home / Self Care | Payer: Medicare Other | Source: Home / Self Care | Attending: Cardiothoracic Surgery

## 2019-11-29 DIAGNOSIS — Z8249 Family history of ischemic heart disease and other diseases of the circulatory system: Secondary | ICD-10-CM

## 2019-11-29 DIAGNOSIS — I493 Ventricular premature depolarization: Secondary | ICD-10-CM | POA: Diagnosis not present

## 2019-11-29 DIAGNOSIS — I4891 Unspecified atrial fibrillation: Secondary | ICD-10-CM | POA: Diagnosis not present

## 2019-11-29 DIAGNOSIS — J95821 Acute postprocedural respiratory failure: Secondary | ICD-10-CM | POA: Diagnosis not present

## 2019-11-29 DIAGNOSIS — D72829 Elevated white blood cell count, unspecified: Secondary | ICD-10-CM | POA: Diagnosis not present

## 2019-11-29 DIAGNOSIS — Z8673 Personal history of transient ischemic attack (TIA), and cerebral infarction without residual deficits: Secondary | ICD-10-CM

## 2019-11-29 DIAGNOSIS — Z09 Encounter for follow-up examination after completed treatment for conditions other than malignant neoplasm: Secondary | ICD-10-CM

## 2019-11-29 DIAGNOSIS — Z951 Presence of aortocoronary bypass graft: Secondary | ICD-10-CM | POA: Diagnosis not present

## 2019-11-29 DIAGNOSIS — Z6834 Body mass index (BMI) 34.0-34.9, adult: Secondary | ICD-10-CM

## 2019-11-29 DIAGNOSIS — Z7982 Long term (current) use of aspirin: Secondary | ICD-10-CM

## 2019-11-29 DIAGNOSIS — Z9889 Other specified postprocedural states: Secondary | ICD-10-CM

## 2019-11-29 DIAGNOSIS — E669 Obesity, unspecified: Secondary | ICD-10-CM | POA: Diagnosis present

## 2019-11-29 DIAGNOSIS — E8770 Fluid overload, unspecified: Secondary | ICD-10-CM | POA: Diagnosis not present

## 2019-11-29 DIAGNOSIS — I2510 Atherosclerotic heart disease of native coronary artery without angina pectoris: Principal | ICD-10-CM | POA: Diagnosis present

## 2019-11-29 DIAGNOSIS — D62 Acute posthemorrhagic anemia: Secondary | ICD-10-CM | POA: Diagnosis not present

## 2019-11-29 DIAGNOSIS — Z801 Family history of malignant neoplasm of trachea, bronchus and lung: Secondary | ICD-10-CM

## 2019-11-29 DIAGNOSIS — E876 Hypokalemia: Secondary | ICD-10-CM | POA: Diagnosis not present

## 2019-11-29 DIAGNOSIS — Z79899 Other long term (current) drug therapy: Secondary | ICD-10-CM

## 2019-11-29 DIAGNOSIS — J9601 Acute respiratory failure with hypoxia: Secondary | ICD-10-CM | POA: Diagnosis not present

## 2019-11-29 DIAGNOSIS — I10 Essential (primary) hypertension: Secondary | ICD-10-CM | POA: Diagnosis present

## 2019-11-29 HISTORY — PX: TEE WITHOUT CARDIOVERSION: SHX5443

## 2019-11-29 HISTORY — DX: Atherosclerotic heart disease of native coronary artery without angina pectoris: I25.10

## 2019-11-29 HISTORY — PX: ENDOVEIN HARVEST OF GREATER SAPHENOUS VEIN: SHX5059

## 2019-11-29 HISTORY — PX: RADIAL ARTERY HARVEST: SHX5067

## 2019-11-29 HISTORY — PX: CORONARY ARTERY BYPASS GRAFT: SHX141

## 2019-11-29 LAB — POCT I-STAT, CHEM 8
BUN: 13 mg/dL (ref 8–23)
BUN: 11 mg/dL (ref 8–23)
BUN: 13 mg/dL (ref 8–23)
BUN: 11 mg/dL (ref 8–23)
BUN: 10 mg/dL (ref 8–23)
Calcium, Ion: 1.22 mmol/L (ref 1.15–1.40)
Calcium, Ion: 1.07 mmol/L — ABNORMAL LOW (ref 1.15–1.40)
Calcium, Ion: 1.19 mmol/L (ref 1.15–1.40)
Calcium, Ion: 1.02 mmol/L — ABNORMAL LOW (ref 1.15–1.40)
Calcium, Ion: 1.02 mmol/L — ABNORMAL LOW (ref 1.15–1.40)
Chloride: 100 mmol/L (ref 98–111)
Chloride: 102 mmol/L (ref 98–111)
Chloride: 99 mmol/L (ref 98–111)
Chloride: 100 mmol/L (ref 98–111)
Chloride: 100 mmol/L (ref 98–111)
Creatinine, Ser: 0.5 mg/dL (ref 0.44–1.00)
Creatinine, Ser: 0.5 mg/dL (ref 0.44–1.00)
Creatinine, Ser: 0.5 mg/dL (ref 0.44–1.00)
Creatinine, Ser: 0.4 mg/dL — ABNORMAL LOW (ref 0.44–1.00)
Creatinine, Ser: 0.5 mg/dL (ref 0.44–1.00)
Glucose, Bld: 129 mg/dL — ABNORMAL HIGH (ref 70–99)
Glucose, Bld: 127 mg/dL — ABNORMAL HIGH (ref 70–99)
Glucose, Bld: 127 mg/dL — ABNORMAL HIGH (ref 70–99)
Glucose, Bld: 160 mg/dL — ABNORMAL HIGH (ref 70–99)
Glucose, Bld: 150 mg/dL — ABNORMAL HIGH (ref 70–99)
HCT: 28 % — ABNORMAL LOW (ref 36.0–46.0)
HCT: 23 % — ABNORMAL LOW (ref 36.0–46.0)
HCT: 30 % — ABNORMAL LOW (ref 36.0–46.0)
HCT: 24 % — ABNORMAL LOW (ref 36.0–46.0)
HCT: 26 % — ABNORMAL LOW (ref 36.0–46.0)
Hemoglobin: 9.5 g/dL — ABNORMAL LOW (ref 12.0–15.0)
Hemoglobin: 10.2 g/dL — ABNORMAL LOW (ref 12.0–15.0)
Hemoglobin: 7.8 g/dL — ABNORMAL LOW (ref 12.0–15.0)
Hemoglobin: 8.2 g/dL — ABNORMAL LOW (ref 12.0–15.0)
Hemoglobin: 8.8 g/dL — ABNORMAL LOW (ref 12.0–15.0)
Potassium: 3.6 mmol/L (ref 3.5–5.1)
Potassium: 3.8 mmol/L (ref 3.5–5.1)
Potassium: 4.2 mmol/L (ref 3.5–5.1)
Potassium: 4.4 mmol/L (ref 3.5–5.1)
Potassium: 3.5 mmol/L (ref 3.5–5.1)
Sodium: 136 mmol/L (ref 135–145)
Sodium: 137 mmol/L (ref 135–145)
Sodium: 137 mmol/L (ref 135–145)
Sodium: 135 mmol/L (ref 135–145)
Sodium: 140 mmol/L (ref 135–145)
TCO2: 24 mmol/L (ref 22–32)
TCO2: 25 mmol/L (ref 22–32)
TCO2: 26 mmol/L (ref 22–32)
TCO2: 28 mmol/L (ref 22–32)
TCO2: 24 mmol/L (ref 22–32)

## 2019-11-29 LAB — POCT I-STAT 7, (LYTES, BLD GAS, ICA,H+H)
Acid-Base Excess: 1 mmol/L (ref 0.0–2.0)
Acid-Base Excess: 3 mmol/L — ABNORMAL HIGH (ref 0.0–2.0)
Acid-Base Excess: 4 mmol/L — ABNORMAL HIGH (ref 0.0–2.0)
Acid-Base Excess: 0 mmol/L (ref 0.0–2.0)
Acid-base deficit: 2 mmol/L (ref 0.0–2.0)
Acid-base deficit: 5 mmol/L — ABNORMAL HIGH (ref 0.0–2.0)
Acid-base deficit: 1 mmol/L (ref 0.0–2.0)
Bicarbonate: 25.6 mmol/L (ref 20.0–28.0)
Bicarbonate: 27.8 mmol/L (ref 20.0–28.0)
Bicarbonate: 28.8 mmol/L — ABNORMAL HIGH (ref 20.0–28.0)
Bicarbonate: 25.1 mmol/L (ref 20.0–28.0)
Bicarbonate: 24.2 mmol/L (ref 20.0–28.0)
Bicarbonate: 21.2 mmol/L (ref 20.0–28.0)
Bicarbonate: 25.5 mmol/L (ref 20.0–28.0)
Calcium, Ion: 1.2 mmol/L (ref 1.15–1.40)
Calcium, Ion: 0.96 mmol/L — ABNORMAL LOW (ref 1.15–1.40)
Calcium, Ion: 0.99 mmol/L — ABNORMAL LOW (ref 1.15–1.40)
Calcium, Ion: 1 mmol/L — ABNORMAL LOW (ref 1.15–1.40)
Calcium, Ion: 1.07 mmol/L — ABNORMAL LOW (ref 1.15–1.40)
Calcium, Ion: 1.04 mmol/L — ABNORMAL LOW (ref 1.15–1.40)
Calcium, Ion: 1.16 mmol/L (ref 1.15–1.40)
HCT: 31 % — ABNORMAL LOW (ref 36.0–46.0)
HCT: 24 % — ABNORMAL LOW (ref 36.0–46.0)
HCT: 25 % — ABNORMAL LOW (ref 36.0–46.0)
HCT: 26 % — ABNORMAL LOW (ref 36.0–46.0)
HCT: 27 % — ABNORMAL LOW (ref 36.0–46.0)
HCT: 33 % — ABNORMAL LOW (ref 36.0–46.0)
HCT: 32 % — ABNORMAL LOW (ref 36.0–46.0)
Hemoglobin: 10.5 g/dL — ABNORMAL LOW (ref 12.0–15.0)
Hemoglobin: 8.2 g/dL — ABNORMAL LOW (ref 12.0–15.0)
Hemoglobin: 8.5 g/dL — ABNORMAL LOW (ref 12.0–15.0)
Hemoglobin: 8.8 g/dL — ABNORMAL LOW (ref 12.0–15.0)
Hemoglobin: 9.2 g/dL — ABNORMAL LOW (ref 12.0–15.0)
Hemoglobin: 11.2 g/dL — ABNORMAL LOW (ref 12.0–15.0)
Hemoglobin: 10.9 g/dL — ABNORMAL LOW (ref 12.0–15.0)
O2 Saturation: 100 %
O2 Saturation: 100 %
O2 Saturation: 100 %
O2 Saturation: 99 %
O2 Saturation: 98 %
O2 Saturation: 97 %
O2 Saturation: 91 %
Patient temperature: 35.7
Patient temperature: 36.8
Patient temperature: 37.1
Potassium: 3.6 mmol/L (ref 3.5–5.1)
Potassium: 3.8 mmol/L (ref 3.5–5.1)
Potassium: 4.4 mmol/L (ref 3.5–5.1)
Potassium: 3.6 mmol/L (ref 3.5–5.1)
Potassium: 3.7 mmol/L (ref 3.5–5.1)
Potassium: 4.8 mmol/L (ref 3.5–5.1)
Potassium: 4 mmol/L (ref 3.5–5.1)
Sodium: 137 mmol/L (ref 135–145)
Sodium: 139 mmol/L (ref 135–145)
Sodium: 138 mmol/L (ref 135–145)
Sodium: 140 mmol/L (ref 135–145)
Sodium: 140 mmol/L (ref 135–145)
Sodium: 139 mmol/L (ref 135–145)
Sodium: 141 mmol/L (ref 135–145)
TCO2: 27 mmol/L (ref 22–32)
TCO2: 29 mmol/L (ref 22–32)
TCO2: 30 mmol/L (ref 22–32)
TCO2: 26 mmol/L (ref 22–32)
TCO2: 26 mmol/L (ref 22–32)
TCO2: 23 mmol/L (ref 22–32)
TCO2: 27 mmol/L (ref 22–32)
pCO2 arterial: 39.7 mmHg (ref 32.0–48.0)
pCO2 arterial: 41.4 mmHg (ref 32.0–48.0)
pCO2 arterial: 43.8 mmHg (ref 32.0–48.0)
pCO2 arterial: 39.8 mmHg (ref 32.0–48.0)
pCO2 arterial: 44.7 mmHg (ref 32.0–48.0)
pCO2 arterial: 42.1 mmHg (ref 32.0–48.0)
pCO2 arterial: 48.6 mmHg — ABNORMAL HIGH (ref 32.0–48.0)
pH, Arterial: 7.418 (ref 7.350–7.450)
pH, Arterial: 7.436 (ref 7.350–7.450)
pH, Arterial: 7.426 (ref 7.350–7.450)
pH, Arterial: 7.407 (ref 7.350–7.450)
pH, Arterial: 7.336 — ABNORMAL LOW (ref 7.350–7.450)
pH, Arterial: 7.309 — ABNORMAL LOW (ref 7.350–7.450)
pH, Arterial: 7.328 — ABNORMAL LOW (ref 7.350–7.450)
pO2, Arterial: 184 mmHg — ABNORMAL HIGH (ref 83.0–108.0)
pO2, Arterial: 391 mmHg — ABNORMAL HIGH (ref 83.0–108.0)
pO2, Arterial: 324 mmHg — ABNORMAL HIGH (ref 83.0–108.0)
pO2, Arterial: 159 mmHg — ABNORMAL HIGH (ref 83.0–108.0)
pO2, Arterial: 116 mmHg — ABNORMAL HIGH (ref 83.0–108.0)
pO2, Arterial: 100 mmHg (ref 83.0–108.0)
pO2, Arterial: 66 mmHg — ABNORMAL LOW (ref 83.0–108.0)

## 2019-11-29 LAB — COMPREHENSIVE METABOLIC PANEL
ALT: 32 U/L (ref 0–44)
AST: 18 U/L (ref 15–41)
Albumin: 3.4 g/dL — ABNORMAL LOW (ref 3.5–5.0)
Alkaline Phosphatase: 98 U/L (ref 38–126)
Anion gap: 9 (ref 5–15)
BUN: 13 mg/dL (ref 8–23)
CO2: 26 mmol/L (ref 22–32)
Calcium: 9.1 mg/dL (ref 8.9–10.3)
Chloride: 102 mmol/L (ref 98–111)
Creatinine, Ser: 0.69 mg/dL (ref 0.44–1.00)
GFR, Estimated: >60 mL/min (ref >60)
Glucose, Bld: 123 mg/dL — ABNORMAL HIGH (ref 70–99)
Potassium: 3.8 mmol/L (ref 3.5–5.1)
Sodium: 137 mmol/L (ref 135–145)
Total Bilirubin: 0.6 mg/dL (ref 0.3–1.2)
Total Protein: 6.4 g/dL — ABNORMAL LOW (ref 6.5–8.1)

## 2019-11-29 LAB — GLUCOSE, CAPILLARY
Glucose-Capillary: 136 mg/dL — ABNORMAL HIGH (ref 70–99)
Glucose-Capillary: 141 mg/dL — ABNORMAL HIGH (ref 70–99)
Glucose-Capillary: 131 mg/dL — ABNORMAL HIGH (ref 70–99)
Glucose-Capillary: 130 mg/dL — ABNORMAL HIGH (ref 70–99)
Glucose-Capillary: 142 mg/dL — ABNORMAL HIGH (ref 70–99)
Glucose-Capillary: 142 mg/dL — ABNORMAL HIGH (ref 70–99)
Glucose-Capillary: 168 mg/dL — ABNORMAL HIGH (ref 70–99)
Glucose-Capillary: 160 mg/dL — ABNORMAL HIGH (ref 70–99)
Glucose-Capillary: 138 mg/dL — ABNORMAL HIGH (ref 70–99)
Glucose-Capillary: 138 mg/dL — ABNORMAL HIGH (ref 70–99)

## 2019-11-29 LAB — ABO/RH: ABO/RH(D): O POS

## 2019-11-29 LAB — HEMOGLOBIN A1C
Hgb A1c MFr Bld: 6.1 % — ABNORMAL HIGH (ref 4.8–5.6)
Mean Plasma Glucose: 128.37 mg/dL

## 2019-11-29 LAB — COOXEMETRY PANEL
Carboxyhemoglobin: 1.1 % (ref 0.5–1.5)
Methemoglobin: 0.8 % (ref 0.0–1.5)
O2 Saturation: 96.9 %
Total hemoglobin: 11.1 g/dL — ABNORMAL LOW (ref 12.0–16.0)

## 2019-11-29 LAB — CBC
HCT: 30.1 % — ABNORMAL LOW (ref 36.0–46.0)
HCT: 33.4 % — ABNORMAL LOW (ref 36.0–46.0)
Hemoglobin: 9.7 g/dL — ABNORMAL LOW (ref 12.0–15.0)
Hemoglobin: 11 g/dL — ABNORMAL LOW (ref 12.0–15.0)
MCH: 29.7 pg (ref 26.0–34.0)
MCH: 29.8 pg (ref 26.0–34.0)
MCHC: 32.2 g/dL (ref 30.0–36.0)
MCHC: 32.9 g/dL (ref 30.0–36.0)
MCV: 92 fL (ref 80.0–100.0)
MCV: 90.5 fL (ref 80.0–100.0)
Platelets: 137 10*3/uL — ABNORMAL LOW (ref 150–400)
Platelets: 115 10*3/uL — ABNORMAL LOW (ref 150–400)
RBC: 3.27 MIL/uL — ABNORMAL LOW (ref 3.87–5.11)
RBC: 3.69 MIL/uL — ABNORMAL LOW (ref 3.87–5.11)
RDW: 14.2 % (ref 11.5–15.5)
RDW: 14.3 % (ref 11.5–15.5)
WBC: 20.4 10*3/uL — ABNORMAL HIGH (ref 4.0–10.5)
WBC: 12.9 10*3/uL — ABNORMAL HIGH (ref 4.0–10.5)
nRBC: 0 % (ref 0.0–0.2)
nRBC: 0 % (ref 0.0–0.2)

## 2019-11-29 LAB — POCT I-STAT EG7
Acid-Base Excess: 1 mmol/L (ref 0.0–2.0)
Bicarbonate: 26 mmol/L (ref 20.0–28.0)
Calcium, Ion: 1.03 mmol/L — ABNORMAL LOW (ref 1.15–1.40)
HCT: 24 % — ABNORMAL LOW (ref 36.0–46.0)
Hemoglobin: 8.2 g/dL — ABNORMAL LOW (ref 12.0–15.0)
O2 Saturation: 79 %
Potassium: 3.5 mmol/L (ref 3.5–5.1)
Sodium: 139 mmol/L (ref 135–145)
TCO2: 27 mmol/L (ref 22–32)
pCO2, Ven: 40.8 mmHg — ABNORMAL LOW (ref 44.0–60.0)
pH, Ven: 7.413 (ref 7.250–7.430)
pO2, Ven: 43 mmHg (ref 32.0–45.0)

## 2019-11-29 LAB — PREPARE RBC (CROSSMATCH)

## 2019-11-29 LAB — HEMOGLOBIN AND HEMATOCRIT, BLOOD
HCT: 25.3 % — ABNORMAL LOW (ref 36.0–46.0)
Hemoglobin: 8.5 g/dL — ABNORMAL LOW (ref 12.0–15.0)

## 2019-11-29 LAB — BASIC METABOLIC PANEL
Anion gap: 7 (ref 5–15)
BUN: 10 mg/dL (ref 8–23)
CO2: 25 mmol/L (ref 22–32)
Calcium: 7.5 mg/dL — ABNORMAL LOW (ref 8.9–10.3)
Chloride: 106 mmol/L (ref 98–111)
Creatinine, Ser: 0.58 mg/dL (ref 0.44–1.00)
GFR, Estimated: >60 mL/min (ref >60)
Glucose, Bld: 145 mg/dL — ABNORMAL HIGH (ref 70–99)
Potassium: 4.3 mmol/L (ref 3.5–5.1)
Sodium: 138 mmol/L (ref 135–145)

## 2019-11-29 LAB — PROTIME-INR
INR: 1.5 — ABNORMAL HIGH (ref 0.8–1.2)
Prothrombin Time: 17.5 seconds — ABNORMAL HIGH (ref 11.4–15.2)

## 2019-11-29 LAB — APTT: aPTT: 32 seconds (ref 24–36)

## 2019-11-29 LAB — PLATELET COUNT: Platelets: 152 10*3/uL (ref 150–400)

## 2019-11-29 SURGERY — CORONARY ARTERY BYPASS GRAFTING (CABG)
Anesthesia: General | Site: Chest | Laterality: Right

## 2019-11-29 MED ORDER — CHLORHEXIDINE GLUCONATE 4 % EX LIQD: 30.0000 mL | CUTANEOUS | Status: DC

## 2019-11-29 MED ORDER — CHLORHEXIDINE GLUCONATE 0.12% ORAL RINSE (MEDLINE KIT)
15.0000 mL | Freq: Two times a day (BID) | OROMUCOSAL | Status: DC
  Administered 2019-11-29 – 2019-11-30 (×2): 15 mL via OROMUCOSAL

## 2019-11-29 MED ORDER — ROCURONIUM BROMIDE 10 MG/ML (PF) SYRINGE
PREFILLED_SYRINGE | INTRAVENOUS | Status: DC | PRN
  Administered 2019-11-29: 60 mg via INTRAVENOUS
  Administered 2019-11-29 (×2): 50 mg via INTRAVENOUS
  Administered 2019-11-29: 40 mg via INTRAVENOUS

## 2019-11-29 MED ORDER — PANTOPRAZOLE SODIUM 40 MG PO TBEC
40.0000 mg | DELAYED_RELEASE_TABLET | Freq: Every day | ORAL | Status: DC
  Administered 2019-12-01 – 2019-12-05 (×4): 40 mg via ORAL
  Filled 2019-11-29 (×5): qty 1

## 2019-11-29 MED ORDER — EPHEDRINE 5 MG/ML INJ
INTRAVENOUS | Status: AC
  Filled 2019-11-29: qty 10

## 2019-11-29 MED ORDER — METOPROLOL TARTRATE 5 MG/5ML IV SOLN: 2.5000 mg | INTRAVENOUS | Status: DC | PRN

## 2019-11-29 MED ORDER — HEPARIN SODIUM (PORCINE) 1000 UNIT/ML IJ SOLN
INTRAMUSCULAR | Status: AC
  Filled 2019-11-29: qty 1

## 2019-11-29 MED ORDER — VANCOMYCIN HCL 1000 MG IV SOLR
INTRAVENOUS | Status: DC | PRN
  Administered 2019-11-29: 3 g via TOPICAL

## 2019-11-29 MED ORDER — ONDANSETRON HCL 4 MG/2ML IJ SOLN
4.0000 mg | Freq: Four times a day (QID) | INTRAMUSCULAR | Status: DC | PRN
  Administered 2019-11-29 – 2019-12-01 (×3): 4 mg via INTRAVENOUS
  Filled 2019-11-29 (×4): qty 2

## 2019-11-29 MED ORDER — ORAL CARE MOUTH RINSE
15.0000 mL | OROMUCOSAL | Status: DC
  Administered 2019-11-29 – 2019-11-30 (×7): 15 mL via OROMUCOSAL

## 2019-11-29 MED ORDER — SUCCINYLCHOLINE CHLORIDE 200 MG/10ML IV SOSY
PREFILLED_SYRINGE | INTRAVENOUS | Status: AC
  Filled 2019-11-29: qty 10

## 2019-11-29 MED ORDER — POTASSIUM CHLORIDE 10 MEQ/50ML IV SOLN
10.0000 meq | INTRAVENOUS | Status: AC
  Administered 2019-11-29 (×3): 10 meq via INTRAVENOUS

## 2019-11-29 MED ORDER — THROMBIN 5000 UNITS EX SOLR
INTRAVENOUS | Status: DC | PRN
  Administered 2019-11-29: 2 mL

## 2019-11-29 MED ORDER — ARTIFICIAL TEARS OPHTHALMIC OINT
TOPICAL_OINTMENT | OPHTHALMIC | Status: AC
  Filled 2019-11-29: qty 3.5

## 2019-11-29 MED ORDER — PROTAMINE SULFATE 10 MG/ML IV SOLN
INTRAVENOUS | Status: DC | PRN
  Administered 2019-11-29: 300 mg via INTRAVENOUS

## 2019-11-29 MED ORDER — ASPIRIN EC 325 MG PO TBEC
325.0000 mg | DELAYED_RELEASE_TABLET | Freq: Every day | ORAL | Status: DC
  Administered 2019-12-01: 325 mg via ORAL
  Filled 2019-11-29: qty 1

## 2019-11-29 MED ORDER — LIDOCAINE 2% (20 MG/ML) 5 ML SYRINGE
INTRAMUSCULAR | Status: AC
  Filled 2019-11-29: qty 5

## 2019-11-29 MED ORDER — LACTATED RINGERS IV SOLN: INTRAVENOUS | Status: DC

## 2019-11-29 MED ORDER — TRAMADOL HCL 50 MG PO TABS
50.0000 mg | ORAL_TABLET | ORAL | Status: DC | PRN
  Administered 2019-11-30: 100 mg via ORAL
  Filled 2019-11-29: qty 2

## 2019-11-29 MED ORDER — CHLORHEXIDINE GLUCONATE CLOTH 2 % EX PADS
6.0000 | MEDICATED_PAD | Freq: Every day | CUTANEOUS | Status: DC
  Administered 2019-11-29 – 2019-12-05 (×5): 6 via TOPICAL

## 2019-11-29 MED ORDER — SODIUM CHLORIDE 0.45 % IV SOLN: INTRAVENOUS | Status: DC | PRN

## 2019-11-29 MED ORDER — SODIUM CHLORIDE 0.9% FLUSH
10.0000 mL | Freq: Two times a day (BID) | INTRAVENOUS | Status: DC
  Administered 2019-11-29 – 2019-12-01 (×3): 10 mL
  Administered 2019-12-02: 20 mL
  Administered 2019-12-02: 10 mL

## 2019-11-29 MED ORDER — DEXTROSE 50 % IV SOLN: 0.0000 mL | INTRAVENOUS | Status: DC | PRN

## 2019-11-29 MED ORDER — EPHEDRINE SULFATE-NACL 50-0.9 MG/10ML-% IV SOSY
PREFILLED_SYRINGE | INTRAVENOUS | Status: DC | PRN
  Administered 2019-11-29: 10 mg via INTRAVENOUS

## 2019-11-29 MED ORDER — LACTATED RINGERS IV SOLN: 500.0000 mL | Freq: Once | INTRAVENOUS | Status: DC | PRN

## 2019-11-29 MED ORDER — MORPHINE SULFATE (PF) 2 MG/ML IV SOLN
1.0000 mg | INTRAVENOUS | Status: DC | PRN
  Administered 2019-11-29 – 2019-12-01 (×7): 2 mg via INTRAVENOUS
  Filled 2019-11-29 (×7): qty 1

## 2019-11-29 MED ORDER — ASPIRIN 81 MG PO CHEW
324.0000 mg | CHEWABLE_TABLET | Freq: Every day | ORAL | Status: DC
  Administered 2019-11-30: 324 mg
  Filled 2019-11-29: qty 4

## 2019-11-29 MED ORDER — ACETAMINOPHEN 160 MG/5ML PO SOLN
1000.0000 mg | Freq: Four times a day (QID) | ORAL | Status: AC
  Administered 2019-11-30 – 2019-12-02 (×3): 1000 mg
  Filled 2019-11-29 (×3): qty 40.6

## 2019-11-29 MED ORDER — CHLORHEXIDINE GLUCONATE 0.12 % MT SOLN
15.0000 mL | OROMUCOSAL | Status: AC
  Administered 2019-11-29: 15 mL via OROMUCOSAL

## 2019-11-29 MED ORDER — STERILE WATER FOR INJECTION IJ SOLN
INTRAMUSCULAR | Status: DC | PRN
  Administered 2019-11-29: 10 mL via INTRAMUSCULAR

## 2019-11-29 MED ORDER — MILRINONE LACTATE IN DEXTROSE 20-5 MG/100ML-% IV SOLN
0.2500 ug/kg/min | INTRAVENOUS | Status: DC
  Administered 2019-11-29 – 2019-12-01 (×3): 0.25 ug/kg/min via INTRAVENOUS
  Filled 2019-11-29 (×3): qty 100

## 2019-11-29 MED ORDER — DOCUSATE SODIUM 100 MG PO CAPS
200.0000 mg | ORAL_CAPSULE | Freq: Every day | ORAL | Status: DC
  Administered 2019-12-01 – 2019-12-02 (×2): 200 mg via ORAL
  Filled 2019-11-29 (×4): qty 2

## 2019-11-29 MED ORDER — PLATELET RICH PLASMA OPTIME
Status: DC | PRN
  Administered 2019-11-29: 10 mL

## 2019-11-29 MED ORDER — PHENYLEPHRINE HCL-NACL 20-0.9 MG/250ML-% IV SOLN: 0.0000 ug/min | INTRAVENOUS | Status: DC

## 2019-11-29 MED ORDER — METOPROLOL TARTRATE 12.5 MG HALF TABLET
12.5000 mg | ORAL_TABLET | Freq: Two times a day (BID) | ORAL | Status: DC
  Administered 2019-12-01 – 2019-12-04 (×6): 12.5 mg via ORAL
  Filled 2019-11-29 (×6): qty 1

## 2019-11-29 MED ORDER — BUPIVACAINE LIPOSOME 1.3 % IJ SUSP
20.0000 mL | INTRAMUSCULAR | Status: AC
  Administered 2019-11-29: 20 mL
  Filled 2019-11-29 (×2): qty 20

## 2019-11-29 MED ORDER — OXYCODONE HCL 5 MG PO TABS
5.0000 mg | ORAL_TABLET | ORAL | Status: DC | PRN
  Administered 2019-11-29: 10 mg via ORAL
  Administered 2019-11-30: 5 mg via ORAL
  Administered 2019-11-30: 10 mg via ORAL
  Administered 2019-11-30: 5 mg via ORAL
  Administered 2019-12-01 (×3): 10 mg via ORAL
  Administered 2019-12-01: 5 mg via ORAL
  Filled 2019-11-29 (×2): qty 1
  Filled 2019-11-29 (×4): qty 2
  Filled 2019-11-29: qty 1
  Filled 2019-11-29: qty 2

## 2019-11-29 MED ORDER — SODIUM CHLORIDE 0.9 % IV SOLN: INTRAVENOUS | Status: DC | PRN

## 2019-11-29 MED ORDER — DEXMEDETOMIDINE HCL IN NACL 400 MCG/100ML IV SOLN
0.0000 ug/kg/h | INTRAVENOUS | Status: DC
  Administered 2019-11-29: 0.4 ug/kg/h via INTRAVENOUS
  Filled 2019-11-29 (×2): qty 100

## 2019-11-29 MED ORDER — BUPIVACAINE HCL (PF) 0.5 % IJ SOLN
INTRAMUSCULAR | Status: AC
  Filled 2019-11-29: qty 30

## 2019-11-29 MED ORDER — ATORVASTATIN CALCIUM 80 MG PO TABS
80.0000 mg | ORAL_TABLET | Freq: Every day | ORAL | Status: DC
  Administered 2019-12-01 – 2019-12-05 (×5): 80 mg via ORAL
  Filled 2019-11-29 (×5): qty 1

## 2019-11-29 MED ORDER — PHENYLEPHRINE 40 MCG/ML (10ML) SYRINGE FOR IV PUSH (FOR BLOOD PRESSURE SUPPORT)
PREFILLED_SYRINGE | INTRAVENOUS | Status: DC | PRN
  Administered 2019-11-29: 80 ug via INTRAVENOUS
  Administered 2019-11-29: 40 ug via INTRAVENOUS
  Administered 2019-11-29 (×2): 160 ug via INTRAVENOUS
  Administered 2019-11-29 (×2): 40 ug via INTRAVENOUS
  Administered 2019-11-29: 80 ug via INTRAVENOUS
  Administered 2019-11-29: 160 ug via INTRAVENOUS

## 2019-11-29 MED ORDER — NITROGLYCERIN IN D5W 200-5 MCG/ML-% IV SOLN: 0.0000 ug/min | INTRAVENOUS | Status: DC

## 2019-11-29 MED ORDER — SODIUM CHLORIDE 0.9% FLUSH
3.0000 mL | Freq: Two times a day (BID) | INTRAVENOUS | Status: DC
  Administered 2019-11-30 – 2019-12-02 (×5): 3 mL via INTRAVENOUS

## 2019-11-29 MED ORDER — VANCOMYCIN HCL IN DEXTROSE 1-5 GM/200ML-% IV SOLN
1000.0000 mg | Freq: Once | INTRAVENOUS | Status: AC
  Administered 2019-11-29: 1000 mg via INTRAVENOUS
  Filled 2019-11-29: qty 200

## 2019-11-29 MED ORDER — BISACODYL 5 MG PO TBEC
10.0000 mg | DELAYED_RELEASE_TABLET | Freq: Every day | ORAL | Status: DC
  Administered 2019-12-01 – 2019-12-02 (×2): 10 mg via ORAL
  Filled 2019-11-29 (×4): qty 2

## 2019-11-29 MED ORDER — ALBUMIN HUMAN 5 % IV SOLN
250.0000 mL | INTRAVENOUS | Status: AC | PRN
  Administered 2019-11-29 (×4): 12.5 g via INTRAVENOUS
  Filled 2019-11-29 (×2): qty 250

## 2019-11-29 MED ORDER — STERILE WATER FOR INJECTION IV SOLN
INTRAVENOUS | Status: DC | PRN
  Administered 2019-11-29: 20 mL

## 2019-11-29 MED ORDER — BUPIVACAINE HCL (PF) 0.5 % IJ SOLN
INTRAMUSCULAR | Status: DC | PRN
  Administered 2019-11-29: 30 mL

## 2019-11-29 MED ORDER — ROCURONIUM BROMIDE 10 MG/ML (PF) SYRINGE
PREFILLED_SYRINGE | INTRAVENOUS | Status: AC
  Filled 2019-11-29: qty 10

## 2019-11-29 MED ORDER — MIDAZOLAM HCL (PF) 10 MG/2ML IJ SOLN
INTRAMUSCULAR | Status: AC
  Filled 2019-11-29: qty 2

## 2019-11-29 MED ORDER — HEMOSTATIC AGENTS (NO CHARGE) OPTIME
TOPICAL | Status: DC | PRN
  Administered 2019-11-29 (×2): 1 via TOPICAL

## 2019-11-29 MED ORDER — LACTATED RINGERS IV SOLN: INTRAVENOUS | Status: DC | PRN

## 2019-11-29 MED ORDER — ACETAMINOPHEN 650 MG RE SUPP
650.0000 mg | Freq: Once | RECTAL | Status: AC
  Administered 2019-11-29: 650 mg via RECTAL

## 2019-11-29 MED ORDER — CHLORHEXIDINE GLUCONATE 0.12 % MT SOLN
15.0000 mL | Freq: Once | OROMUCOSAL | Status: AC
  Administered 2019-11-29: 15 mL via OROMUCOSAL
  Filled 2019-11-29: qty 15

## 2019-11-29 MED ORDER — METOPROLOL TARTRATE 12.5 MG HALF TABLET: 12.5000 mg | ORAL_TABLET | Freq: Once | ORAL | Status: DC

## 2019-11-29 MED ORDER — CALCIUM CHLORIDE 10 % IV SOLN
1.0000 g | Freq: Once | INTRAVENOUS | Status: AC
  Administered 2019-11-29: 1 g via INTRAVENOUS

## 2019-11-29 MED ORDER — 0.9 % SODIUM CHLORIDE (POUR BTL) OPTIME
TOPICAL | Status: DC | PRN
  Administered 2019-11-29: 5000 mL

## 2019-11-29 MED ORDER — SODIUM CHLORIDE 0.9% FLUSH: 10.0000 mL | INTRAVENOUS | Status: DC | PRN

## 2019-11-29 MED ORDER — BISACODYL 10 MG RE SUPP: 10.0000 mg | Freq: Every day | RECTAL | Status: DC

## 2019-11-29 MED ORDER — INSULIN REGULAR(HUMAN) IN NACL 100-0.9 UT/100ML-% IV SOLN: INTRAVENOUS | Status: DC

## 2019-11-29 MED ORDER — SODIUM CHLORIDE 0.9% FLUSH
3.0000 mL | INTRAVENOUS | Status: DC | PRN
  Administered 2019-12-01: 3 mL via INTRAVENOUS

## 2019-11-29 MED ORDER — SODIUM CHLORIDE 0.9 % IV SOLN: INTRAVENOUS | Status: DC

## 2019-11-29 MED ORDER — PROPOFOL 10 MG/ML IV BOLUS
INTRAVENOUS | Status: DC | PRN
  Administered 2019-11-29: 120 mg via INTRAVENOUS

## 2019-11-29 MED ORDER — METOPROLOL TARTRATE 25 MG/10 ML ORAL SUSPENSION: 12.5000 mg | Freq: Two times a day (BID) | ORAL | Status: DC

## 2019-11-29 MED ORDER — SODIUM CHLORIDE 0.9% IV SOLUTION: Freq: Once | INTRAVENOUS | Status: AC

## 2019-11-29 MED ORDER — ACETAMINOPHEN 500 MG PO TABS
1000.0000 mg | ORAL_TABLET | Freq: Four times a day (QID) | ORAL | Status: AC
  Administered 2019-11-29 – 2019-12-04 (×6): 1000 mg via ORAL
  Filled 2019-11-29 (×10): qty 2

## 2019-11-29 MED ORDER — SODIUM CHLORIDE 0.9 % IV SOLN
250.0000 mL | INTRAVENOUS | Status: DC
  Administered 2019-11-30: 250 mL via INTRAVENOUS

## 2019-11-29 MED ORDER — FENTANYL CITRATE (PF) 250 MCG/5ML IJ SOLN
INTRAMUSCULAR | Status: DC | PRN
  Administered 2019-11-29: 50 ug via INTRAVENOUS
  Administered 2019-11-29: 100 ug via INTRAVENOUS
  Administered 2019-11-29: 50 ug via INTRAVENOUS
  Administered 2019-11-29: 100 ug via INTRAVENOUS
  Administered 2019-11-29: 250 ug via INTRAVENOUS
  Administered 2019-11-29: 100 ug via INTRAVENOUS
  Administered 2019-11-29: 200 ug via INTRAVENOUS
  Administered 2019-11-29: 50 ug via INTRAVENOUS
  Administered 2019-11-29: 100 ug via INTRAVENOUS
  Administered 2019-11-29: 150 ug via INTRAVENOUS
  Administered 2019-11-29 (×2): 50 ug via INTRAVENOUS

## 2019-11-29 MED ORDER — VANCOMYCIN HCL 1000 MG IV SOLR
INTRAVENOUS | Status: AC
  Filled 2019-11-29: qty 3000

## 2019-11-29 MED ORDER — STERILE WATER FOR INJECTION IJ SOLN
INTRAMUSCULAR | Status: AC
  Filled 2019-11-29: qty 10

## 2019-11-29 MED ORDER — ACETAMINOPHEN 160 MG/5ML PO SOLN: 650.0000 mg | Freq: Once | ORAL | Status: AC

## 2019-11-29 MED ORDER — PROTAMINE SULFATE 10 MG/ML IV SOLN
INTRAVENOUS | Status: AC
  Filled 2019-11-29: qty 25

## 2019-11-29 MED ORDER — HEPARIN SODIUM (PORCINE) 1000 UNIT/ML IJ SOLN
INTRAMUSCULAR | Status: DC | PRN
  Administered 2019-11-29: 30000 [IU] via INTRAVENOUS

## 2019-11-29 MED ORDER — FAMOTIDINE IN NACL 20-0.9 MG/50ML-% IV SOLN
20.0000 mg | Freq: Two times a day (BID) | INTRAVENOUS | Status: AC
  Administered 2019-11-30: 20 mg via INTRAVENOUS
  Filled 2019-11-29: qty 50

## 2019-11-29 MED ORDER — SODIUM CHLORIDE 0.9 % IV SOLN
20.0000 ug | INTRAVENOUS | Status: AC
  Administered 2019-11-29: 20 ug via INTRAVENOUS
  Filled 2019-11-29: qty 5

## 2019-11-29 MED ORDER — MIDAZOLAM HCL 5 MG/5ML IJ SOLN
INTRAMUSCULAR | Status: DC | PRN
  Administered 2019-11-29 (×2): 1 mg via INTRAVENOUS
  Administered 2019-11-29: 2 mg via INTRAVENOUS
  Administered 2019-11-29: 3 mg via INTRAVENOUS
  Administered 2019-11-29: 2 mg via INTRAVENOUS
  Administered 2019-11-29: 1 mg via INTRAVENOUS

## 2019-11-29 MED ORDER — FAMOTIDINE IN NACL 20-0.9 MG/50ML-% IV SOLN
20.0000 mg | Freq: Two times a day (BID) | INTRAVENOUS | Status: DC
  Administered 2019-11-29: 20 mg via INTRAVENOUS
  Filled 2019-11-29: qty 50

## 2019-11-29 MED ORDER — ALBUMIN HUMAN 5 % IV SOLN: INTRAVENOUS | Status: DC | PRN

## 2019-11-29 MED ORDER — MAGNESIUM SULFATE 4 GM/100ML IV SOLN
4.0000 g | Freq: Once | INTRAVENOUS | Status: AC
  Administered 2019-11-29: 4 g via INTRAVENOUS
  Filled 2019-11-29: qty 100

## 2019-11-29 MED ORDER — LACTATED RINGERS IV SOLN
INTRAVENOUS | Status: DC
  Administered 2019-12-02: 20 mL via INTRAVENOUS

## 2019-11-29 MED ORDER — MILRINONE LACTATE IN DEXTROSE 20-5 MG/100ML-% IV SOLN: 0.2500 ug/kg/min | INTRAVENOUS | Status: DC

## 2019-11-29 MED ORDER — PHENYLEPHRINE 40 MCG/ML (10ML) SYRINGE FOR IV PUSH (FOR BLOOD PRESSURE SUPPORT)
PREFILLED_SYRINGE | INTRAVENOUS | Status: AC
  Filled 2019-11-29: qty 10

## 2019-11-29 MED ORDER — FENTANYL CITRATE (PF) 250 MCG/5ML IJ SOLN
INTRAMUSCULAR | Status: AC
  Filled 2019-11-29: qty 25

## 2019-11-29 MED ORDER — SODIUM CHLORIDE 0.9 % IV SOLN
1.5000 g | Freq: Two times a day (BID) | INTRAVENOUS | Status: AC
  Administered 2019-11-29 – 2019-12-01 (×4): 1.5 g via INTRAVENOUS
  Filled 2019-11-29 (×4): qty 1.5

## 2019-11-29 MED ORDER — PROPOFOL 10 MG/ML IV BOLUS
INTRAVENOUS | Status: AC
  Filled 2019-11-29: qty 20

## 2019-11-29 MED ORDER — PLATELET POOR PLASMA OPTIME
Status: DC | PRN
  Administered 2019-11-29: 10 mL

## 2019-11-29 MED ORDER — NOREPINEPHRINE 4 MG/250ML-% IV SOLN
0.0000 ug/min | INTRAVENOUS | Status: DC
  Administered 2019-11-29: 2 ug/min via INTRAVENOUS

## 2019-11-29 MED ORDER — SODIUM BICARBONATE 8.4 % IV SOLN
100.0000 meq | Freq: Once | INTRAVENOUS | Status: AC
  Administered 2019-11-29: 100 meq via INTRAVENOUS

## 2019-11-29 MED ORDER — MIDAZOLAM HCL 2 MG/2ML IJ SOLN: 2.0000 mg | INTRAMUSCULAR | Status: DC | PRN

## 2019-11-29 MED ORDER — SERTRALINE HCL 25 MG PO TABS
25.0000 mg | ORAL_TABLET | Freq: Every day | ORAL | Status: DC | PRN
  Administered 2019-12-02: 25 mg via ORAL
  Filled 2019-11-29 (×3): qty 1

## 2019-11-29 MED ORDER — PLASMA-LYTE 148 IV SOLN
INTRAVENOUS | Status: DC | PRN
  Administered 2019-11-29: 500 mL via INTRAVASCULAR

## 2019-11-29 SURGICAL SUPPLY — 122 items
ADAPTER CARDIO PERF ANTE/RETRO (ADAPTER) ×5 IMPLANT
APPLICATOR TIP COSEAL (VASCULAR PRODUCTS) ×10 IMPLANT
APPLIER CLIP 9.375 SM OPEN (CLIP)
BAG DECANTER FOR FLEXI CONT (MISCELLANEOUS) ×5 IMPLANT
BINDER BREAST XXLRG (GAUZE/BANDAGES/DRESSINGS) ×5 IMPLANT
BLADE CLIPPER SURG (BLADE) IMPLANT
BLADE STERNUM SYSTEM 6 (BLADE) ×5 IMPLANT
BLADE SURG 15 STRL LF DISP TIS (BLADE) ×8 IMPLANT
BLADE SURG 15 STRL SS (BLADE) ×2
BNDG ELASTIC 4X5.8 VLCR STR LF (GAUZE/BANDAGES/DRESSINGS) ×10 IMPLANT
BNDG ELASTIC 6X5.8 VLCR STR LF (GAUZE/BANDAGES/DRESSINGS) ×5 IMPLANT
BNDG GAUZE ELAST 4 BULKY (GAUZE/BANDAGES/DRESSINGS) ×10 IMPLANT
CANISTER SUCT 3000ML PPV (MISCELLANEOUS) ×5 IMPLANT
CANNULA NON VENT 20FR 12 (CANNULA) ×5 IMPLANT
CATH CPB KIT HENDRICKSON (MISCELLANEOUS) ×5 IMPLANT
CATH ROBINSON RED A/P 18FR (CATHETERS) ×10 IMPLANT
CLIP APPLIE 9.375 SM OPEN (CLIP) IMPLANT
CLIP RETRACTION 3.0MM CORONARY (MISCELLANEOUS) ×5 IMPLANT
CLIP VESOCCLUDE MED 24/CT (CLIP) IMPLANT
CLIP VESOCCLUDE SM WIDE 24/CT (CLIP) IMPLANT
COVER MAYO STAND STRL (DRAPES) ×10 IMPLANT
CUFF TOURN SGL QUICK 18X4 (TOURNIQUET CUFF) IMPLANT
CUFF TOURN SGL QUICK 24 (TOURNIQUET CUFF)
CUFF TRNQT CYL 24X4X16.5-23 (TOURNIQUET CUFF) IMPLANT
DEFOGGER ANTIFOG KIT (MISCELLANEOUS) ×5 IMPLANT
DERMABOND ADVANCED (GAUZE/BANDAGES/DRESSINGS) ×2
DERMABOND ADVANCED .7 DNX12 (GAUZE/BANDAGES/DRESSINGS) ×8 IMPLANT
DRAIN CHANNEL 28F RND 3/8 FF (WOUND CARE) ×15 IMPLANT
DRAPE CARDIOVASCULAR INCISE (DRAPES) ×1
DRAPE EXTREMITY T 121X128X90 (DISPOSABLE) ×5 IMPLANT
DRAPE HALF SHEET 40X57 (DRAPES) ×5 IMPLANT
DRAPE SLUSH/WARMER DISC (DRAPES) ×5 IMPLANT
DRAPE SRG 135X102X78XABS (DRAPES) ×4 IMPLANT
DRSG AQUACEL AG ADV 3.5X14 (GAUZE/BANDAGES/DRESSINGS) ×5 IMPLANT
ELECT CAUTERY BLADE 6.4 (BLADE) ×5 IMPLANT
ELECT REM PT RETURN 9FT ADLT (ELECTROSURGICAL) ×10
ELECTRODE REM PT RTRN 9FT ADLT (ELECTROSURGICAL) ×8 IMPLANT
FELT TEFLON 1X6 (MISCELLANEOUS) ×5 IMPLANT
GAUZE SPONGE 4X4 12PLY STRL (GAUZE/BANDAGES/DRESSINGS) ×5 IMPLANT
GAUZE SPONGE 4X4 12PLY STRL LF (GAUZE/BANDAGES/DRESSINGS) ×10 IMPLANT
GEL ULTRASOUND 20GR AQUASONIC (MISCELLANEOUS) ×5 IMPLANT
GLOVE BIO SURGEON STRL SZ 6 (GLOVE) ×5 IMPLANT
GLOVE BIO SURGEON STRL SZ 6.5 (GLOVE) ×25 IMPLANT
GLOVE BIO SURGEON STRL SZ7.5 (GLOVE) ×5 IMPLANT
GLOVE BIOGEL PI IND STRL 6.5 (GLOVE) ×20 IMPLANT
GLOVE BIOGEL PI IND STRL 7.0 (GLOVE) ×4 IMPLANT
GLOVE BIOGEL PI IND STRL 7.5 (GLOVE) ×4 IMPLANT
GLOVE BIOGEL PI IND STRL 9 (GLOVE) ×4 IMPLANT
GLOVE BIOGEL PI INDICATOR 6.5 (GLOVE) ×5
GLOVE BIOGEL PI INDICATOR 7.0 (GLOVE) ×1
GLOVE BIOGEL PI INDICATOR 7.5 (GLOVE) ×1
GLOVE BIOGEL PI INDICATOR 9 (GLOVE) ×1
GLOVE NEODERM STRL 7.5 LF PF (GLOVE) ×16 IMPLANT
GLOVE SURG NEODERM 7.5  LF PF (GLOVE) ×4
GOWN STRL REUS W/ TWL LRG LVL3 (GOWN DISPOSABLE) ×36 IMPLANT
GOWN STRL REUS W/TWL LRG LVL3 (GOWN DISPOSABLE) ×9
HEMOSTAT POWDER SURGIFOAM 1G (HEMOSTASIS) ×10 IMPLANT
INSERT FOGARTY XLG (MISCELLANEOUS) ×5 IMPLANT
INSERT SUTURE HOLDER (MISCELLANEOUS) ×10 IMPLANT
KIT APPLICATOR RATIO 11:1 (KITS) ×5 IMPLANT
KIT BASIN OR (CUSTOM PROCEDURE TRAY) ×5 IMPLANT
KIT SUCTION CATH 14FR (SUCTIONS) ×5 IMPLANT
KIT TURNOVER KIT B (KITS) ×5 IMPLANT
KIT VASOVIEW HEMOPRO 2 VH 4000 (KITS) ×5 IMPLANT
MARKER GRAFT CORONARY BYPASS (MISCELLANEOUS) IMPLANT
NEEDLE 18GX1X1/2 (RX/OR ONLY) (NEEDLE) ×5 IMPLANT
NS IRRIG 1000ML POUR BTL (IV SOLUTION) ×25 IMPLANT
PACK E OPEN HEART (SUTURE) ×5 IMPLANT
PACK OPEN HEART (CUSTOM PROCEDURE TRAY) ×5 IMPLANT
PACK PLATELET PROCEDURE 60 (MISCELLANEOUS) ×5 IMPLANT
PACK SPY-PHI (KITS) ×5 IMPLANT
PAD ARMBOARD 7.5X6 YLW CONV (MISCELLANEOUS) ×5 IMPLANT
PAD ELECT DEFIB RADIOL ZOLL (MISCELLANEOUS) ×5 IMPLANT
PENCIL BUTTON HOLSTER BLD 10FT (ELECTRODE) ×5 IMPLANT
POSITIONER HEAD DONUT 9IN (MISCELLANEOUS) ×5 IMPLANT
POWDER SURGICEL 3.0 GRAM (HEMOSTASIS) ×5 IMPLANT
PUNCH AORTIC ROTATE 4.5MM 8IN (MISCELLANEOUS) ×5 IMPLANT
SEALANT SURG COSEAL 4ML (VASCULAR PRODUCTS) ×5 IMPLANT
SEALANT SURG COSEAL 8ML (VASCULAR PRODUCTS) IMPLANT
SET CARDIOPLEGIA MPS 5001102 (MISCELLANEOUS) ×5 IMPLANT
SHEARS HARMONIC 9CM CVD (BLADE) IMPLANT
STAPLER VISISTAT 35W (STAPLE) ×5 IMPLANT
SUPPORT HEART JANKE-BARRON (MISCELLANEOUS) ×5 IMPLANT
SUT BONE WAX W31G (SUTURE) ×5 IMPLANT
SUT MNCRL AB 3-0 PS2 18 (SUTURE) ×10 IMPLANT
SUT PDS AB 1 CTX 36 (SUTURE) ×10 IMPLANT
SUT PROLENE 3 0 SH DA (SUTURE) ×5 IMPLANT
SUT PROLENE 4 0 SH DA (SUTURE) ×5 IMPLANT
SUT PROLENE 5 0 C 1 36 (SUTURE) ×5 IMPLANT
SUT PROLENE 6 0 C 1 30 (SUTURE) ×30 IMPLANT
SUT PROLENE 7 0 BV1 MDA (SUTURE) ×5 IMPLANT
SUT PROLENE 8 0 BV175 6 (SUTURE) ×5 IMPLANT
SUT PROLENE BLUE 7 0 (SUTURE) ×5 IMPLANT
SUT SILK  1 MH (SUTURE) ×1
SUT SILK 1 MH (SUTURE) ×4 IMPLANT
SUT SILK 2 0 SH CR/8 (SUTURE) IMPLANT
SUT SILK 3 0 SH CR/8 (SUTURE) IMPLANT
SUT STEEL 6MS V (SUTURE) ×5 IMPLANT
SUT STEEL SZ 6 DBL 3X14 BALL (SUTURE) ×5 IMPLANT
SUT TEM PAC WIRE 2 0 SH (SUTURE) ×5 IMPLANT
SUT VIC AB 2-0 CT1 27 (SUTURE) ×2
SUT VIC AB 2-0 CT1 TAPERPNT 27 (SUTURE) ×8 IMPLANT
SUT VIC AB 2-0 CTX 27 (SUTURE) IMPLANT
SUT VIC AB 3-0 SH 27 (SUTURE)
SUT VIC AB 3-0 SH 27X BRD (SUTURE) IMPLANT
SUT VIC AB 3-0 X1 27 (SUTURE) ×5 IMPLANT
SYR 10ML LL (SYRINGE) IMPLANT
SYR 20ML LL LF (SYRINGE) ×5 IMPLANT
SYR 30ML LL (SYRINGE) ×5 IMPLANT
SYR 3ML LL SCALE MARK (SYRINGE) IMPLANT
SYR 50ML SLIP (SYRINGE) IMPLANT
SYR BULB IRRIG 60ML STRL (SYRINGE) ×10 IMPLANT
SYSTEM SAHARA CHEST DRAIN ATS (WOUND CARE) ×5 IMPLANT
TAPE CLOTH SURG 4X10 WHT LF (GAUZE/BANDAGES/DRESSINGS) ×5 IMPLANT
TAPE PAPER 2X10 WHT MICROPORE (GAUZE/BANDAGES/DRESSINGS) ×5 IMPLANT
TIP DUAL SPRAY TOPICAL (TIP) ×10 IMPLANT
TOWEL GREEN STERILE (TOWEL DISPOSABLE) ×5 IMPLANT
TOWEL GREEN STERILE FF (TOWEL DISPOSABLE) ×5 IMPLANT
TRAY FOLEY SLVR 16FR TEMP STAT (SET/KITS/TRAYS/PACK) ×5 IMPLANT
TUBING LAP HI FLOW INSUFFLATIO (TUBING) ×5 IMPLANT
UNDERPAD 30X36 HEAVY ABSORB (UNDERPADS AND DIAPERS) ×5 IMPLANT
WATER STERILE IRR 1000ML POUR (IV SOLUTION) ×10 IMPLANT

## 2019-11-29 NOTE — Anesthesia Procedure Notes (Signed)
Procedure Name: Intubation Date/Time: 11/29/2019 7:57 AM Performed by: Lowella Dell, CRNA Pre-anesthesia Checklist: Patient identified, Emergency Drugs available, Suction available and Patient being monitored Patient Re-evaluated:Patient Re-evaluated prior to induction Oxygen Delivery Method: Circle System Utilized Preoxygenation: Pre-oxygenation with 100% oxygen Induction Type: IV induction Ventilation: Mask ventilation without difficulty Laryngoscope Size: Mac and 3 Grade View: Grade I Tube type: Oral Tube size: 8.0 mm Number of attempts: 1 Airway Equipment and Method: Stylet Placement Confirmation: ETT inserted through vocal cords under direct vision,  positive ETCO2 and breath sounds checked- equal and bilateral Secured at: 22 cm Tube secured with: Tape Dental Injury: Teeth and Oropharynx as per pre-operative assessment

## 2019-11-29 NOTE — Progress Notes (Signed)
Echocardiogram Echocardiogram Transesophageal has been performed.  Oneal Deputy Jan Walters 11/29/2019, 1:08 PM

## 2019-11-29 NOTE — Transfer of Care (Signed)
Immediate Anesthesia Transfer of Care Note  Patient: Jamie Hodge  Procedure(s) Performed: CORONARY ARTERY BYPASS GRAFTING (CABG) x 6 ON PUMP. USING LEFT INTERNAL MAMMARY ARTERY, LEFT RADIAL ARTERY AND RIGHT GREATER SAPHENOUS VEIN. (N/A Chest) RADIAL ARTERY HARVEST (Left Arm Lower) TRANSESOPHAGEAL ECHOCARDIOGRAM (TEE) (N/A ) INDOCYANINE GREEN FLUORESCENCE IMAGING (ICG) (N/A ) ENDOVEIN HARVEST OF GREATER SAPHENOUS VEIN (Right )  Patient Location: ICU  Anesthesia Type:General  Level of Consciousness: sedated and Patient remains intubated per anesthesia plan  Airway & Oxygen Therapy: Patient remains intubated per anesthesia plan and Patient placed on Ventilator (see vital sign flow sheet for setting)  Post-op Assessment: Report given to RN and Post -op Vital signs reviewed and stable  Post vital signs: Reviewed and stable  Last Vitals:  Vitals Value Taken Time  BP 105/76 11/29/19 1333  Temp 35.7 C 11/29/19 1345  Pulse 91 11/29/19 1345  Resp 10 11/29/19 1345  SpO2 99 % 11/29/19 1345  Vitals shown include unvalidated device data.  Last Pain:  Vitals:   11/29/19 0626  TempSrc:   PainSc: 0-No pain         Complications: No complications documented.

## 2019-11-29 NOTE — Anesthesia Preprocedure Evaluation (Addendum)
Anesthesia Evaluation  Patient identified by MRN, date of birth, ID band Patient awake    Reviewed: Allergy & Precautions, NPO status , Patient's Chart, lab work & pertinent test results  Airway Mallampati: II  TM Distance: >3 FB     Dental  (+) Teeth Intact, Dental Advisory Given   Pulmonary    breath sounds clear to auscultation       Cardiovascular hypertension, + CAD   Rhythm:Regular Rate:Normal     Neuro/Psych TIACVA    GI/Hepatic negative GI ROS, Neg liver ROS,   Endo/Other  negative endocrine ROS  Renal/GU negative Renal ROS     Musculoskeletal   Abdominal   Peds  Hematology   Anesthesia Other Findings   Reproductive/Obstetrics                            Anesthesia Physical Anesthesia Plan  ASA: III  Anesthesia Plan: General   Post-op Pain Management:    Induction: Intravenous  PONV Risk Score and Plan: 3 and Ondansetron, Dexamethasone and Midazolam  Airway Management Planned: Oral ETT  Additional Equipment: Arterial line, PA Cath, TEE and Ultrasound Guidance Line Placement  Intra-op Plan:   Post-operative Plan:   Informed Consent: I have reviewed the patients History and Physical, chart, labs and discussed the procedure including the risks, benefits and alternatives for the proposed anesthesia with the patient or authorized representative who has indicated his/her understanding and acceptance.     Dental advisory given  Plan Discussed with: CRNA and Anesthesiologist  Anesthesia Plan Comments:         Anesthesia Quick Evaluation

## 2019-11-29 NOTE — Anesthesia Postprocedure Evaluation (Signed)
Anesthesia Post Note  Patient: ALAIRA LEVEL  Procedure(s) Performed: CORONARY ARTERY BYPASS GRAFTING (CABG) x 6 ON PUMP. USING LEFT INTERNAL MAMMARY ARTERY, LEFT RADIAL ARTERY AND RIGHT GREATER SAPHENOUS VEIN. (N/A Chest) RADIAL ARTERY HARVEST (Left Arm Lower) TRANSESOPHAGEAL ECHOCARDIOGRAM (TEE) (N/A ) INDOCYANINE Renan Danese FLUORESCENCE IMAGING (ICG) (N/A ) ENDOVEIN HARVEST OF GREATER SAPHENOUS VEIN (Right )     Patient location during evaluation: SICU Anesthesia Type: General Level of consciousness: patient remains intubated per anesthesia plan Pain management: pain level controlled Vital Signs Assessment: post-procedure vital signs reviewed and stable Respiratory status: patient remains intubated per anesthesia plan Cardiovascular status: stable Postop Assessment: no apparent nausea or vomiting Anesthetic complications: no   No complications documented.  Last Vitals:  Vitals:   11/29/19 1815 11/29/19 1820  BP:    Pulse: 90 90  Resp: 19 17  Temp: 36.7 C 36.8 C  SpO2: 99% 98%    Last Pain:  Vitals:   11/29/19 1400  TempSrc: Core (Comment)  PainSc:                  [place]

## 2019-11-29 NOTE — Op Note (Signed)
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 This was followed by coronary assessment which demonstrated severe, multivessel CAD. She presents for CABG.    DETAILS OF THE OPERATIVE PROCEDURE  Preparation:  The patient is brought to the operating room on the above mentioned date and central monitoring was established by the anesthesia team including placement of Swan-Ganz catheter and radial arterial line. The patient is placed in the supine position on the operating table.  Intravenous antibiotics are administered. General endotracheal anesthesia is induced uneventfully. A Foley catheter is placed.  Baseline transesophageal echocardiogram was performed.  Findings were notable for small LV apical aneurysm and preserved left ventricular function  The patient's chest, abdomen, both groins, the left upper  extremity, and both lower extremities are prepared and draped in a sterile manner. A time out procedure is performed.   Surgical Approach and Conduit Harvest:  Attention is 1st turned to open left radial artery harvesting, where an incision is made overlying the palpable left radial pulse. The harmonic scalpel system is used to dissect the vessel and its pedicle. Once removed, the proximal and distal end of the pedicle are controlled with suture ligatures. The forearm incision is closed in layers, and the arm is tucked at the left side. A median sternotomy incision was performed and the left internal mammary artery is dissected from the chest wall and prepared for bypass grafting. The left internal mammary artery is notably good quality conduit. Simultaneously, the greater saphenous vein is obtained from the patient's right thigh using endoscopic vein harvest technique. The saphenous vein is a good quality conduit. After removal of the saphenous vein, the small surgical incisions in the lower extremity are closed with absorbable suture. Following systemic heparinization, the left internal mammary artery was transected distally noted to have excellent flow.   Extracorporeal Cardiopulmonary Bypass and Myocardial Protection:  The pericardium is opened. The ascending aorta is nondiseased in appearance. The ascending aorta and the right atrium are cannulated for cardiopulmonary bypass.  Adequate heparinization is verified.     The entire pre-bypass portion of the operation was notable for stable hemodynamics.  Cardiopulmonary bypass was begun and the surface of the heart is inspected. Distal target vessels are selected for coronary artery bypass grafting. A cardioplegia cannula is placed in the ascending aorta.    The patient is allowed to cool passively to 34C systemic temperature.  The aortic cross clamp is applied and cold blood cardioplegia is delivered initially in an antegrade fashion through the  aortic root.  Iced saline slush is applied for topical hypothermia.  The initial cardioplegic arrest is rapid with early diastolic arrest.  Repeat doses of cardioplegia are administered intermittently throughout the entire cross clamp portion of the operation through the aortic root and through subsequently placed vein grafts in order to maintain completely flat electrocardiogram .  Coronary Artery Bypass Grafting:   The posterior descending and posterolateral branches of the right coronary artery were grafted using a reversed saphenous vein graft in a sequenced fashion.  At the site of distal anastomoses the target vessels were good quality and measured approximately 1.5 mm in diameter.  The first obtuse marginal branch of the left circumflex coronary artery and the left posterior lateral artery were grafted using the left radial artery graft in a sequenced fashion.  At the site of distal anastomosis the target vessel were good quality and measured approximately 1.5 mm in diameter.  The  diagonal branch of the left anterior descending coronary artery was grafted using a reversed saphenous vein graft in an end-to-side fashion.  At the site of distal anastomosis the target vessel was good quality and measured approximately 1.5 mm in diameter.   The distal left anterior coronary artery was grafted with the left internal mammary artery in an end-to-side fashion.  At the site of distal anastomosis the target vessel was good quality and measured approximately 1.5 mm in diameter. Anastomotic patency and runoff was confirmed with indocyanine green fluorescence imaging (SPY).  All proximal graft anastomoses were placed directly to the ascending aorta prior to removal of the aortic cross clamp.  De-airing procedures were performed and the aortic cross-clamp was removed   Procedure Completion:  All proximal and distal coronary anastomoses were inspected for hemostasis and appropriate graft orientation.  Epicardial pacing wires are fixed to the right ventricular outflow tract and to the right atrial appendage. The patient is rewarmed to 37C temperature. The patient is weaned and disconnected from cardiopulmonary bypass.  The patient's rhythm at separation from bypass was normal sinus.  The patient was weaned from cardiopulmonary bypass without any inotropic support.  Followup transesophageal echocardiogram performed after separation from bypass revealed no changes from the preoperative exam.  The aortic and venous cannula were removed uneventfully. Protamine was administered to reverse the anticoagulation. The mediastinum and pleural space were inspected for hemostasis and irrigated with saline solution. The mediastinum and both pleural space were drained using fluted chest tubes placed through separate stab incisions inferiorly.  The soft tissues anterior to the aorta were reapproximated loosely. The sternum is closed with double strength sternal wire. The soft tissues anterior to the sternum were closed in multiple layers and the skin is closed with a running subcuticular skin closure.  The post-bypass portion of the operation was notable for stable rhythm and hemodynamics.    Disposition:  The patient tolerated the procedure well and is transported to the surgical intensive care in stable condition. There are no intraoperative complications. All sponge instrument and needle counts are verified correct at completion of the operation.    Jayme Cloud, MD 11/29/2019 2:Jamie PM

## 2019-11-29 NOTE — Brief Op Note (Signed)
11/29/2019  12:04 PM  PATIENT:  Jamie Hodge  79 y.o. female  PRE-OPERATIVE DIAGNOSIS:  CAD  POST-OPERATIVE DIAGNOSIS:  CAD  PROCEDURE:  Procedure(s): CORONARY ARTERY BYPASS GRAFTING (CABG) x 4 ON PUMP. USING LEFT INTERNAL MAMMARY ARTERY, LEFT RADIAL ARTERY AND RIGHT GREATER SAPHENOUS VEIN. (N/A) RADIAL ARTERY HARVEST (Left) TRANSESOPHAGEAL ECHOCARDIOGRAM (TEE) (N/A) INDOCYANINE GREEN FLUORESCENCE IMAGING (ICG) (N/A) ENDOVEIN HARVEST OF GREATER SAPHENOUS VEIN (Right) [place] LEFT RADIAL- OM1-OM2 SVG-PD-PL SVG-DIAG LEFT RAD- 45 MINUTES EVH 50 MIN  SURGEON:  Surgeon(s) and Role:    * Ramon Zanders, Glenice Bow, MD - Primary  PHYSICIAN ASSISTANT: WAYNE GOLD PA-C  ANESTHESIA:   general  EBL: per anes BLOOD ADMINISTERED:300 CC PRBC  DRAINS: LEFT PLEURAL ANE MEDIASTINAL CHEST TUBES one right pleural tube  LOCAL MEDICATIONS USED:  OTHER EXPAREL  SPECIMEN:  No Specimen  DISPOSITION OF SPECIMEN:  N/A  COUNTS:  YES  TOURNIQUET:  * No tourniquets in log *  DICTATION: .Dragon Dictation  PLAN OF CARE: Admit to inpatient   PATIENT DISPOSITION:  ICU - intubated and hemodynamically stable.   Delay start of Pharmacological VTE agent (>24hrs) due to surgical blood loss or risk of bleeding: yes  COMPLICATIONS: NO KNOWN

## 2019-11-29 NOTE — Anesthesia Procedure Notes (Signed)
Central Venous Catheter Insertion Performed by: Annye Asa, MD, anesthesiologist Start/End11/08/2019 6:50 AM, 11/29/2019 7:03 AM Patient location: Pre-op. Preanesthetic checklist: patient identified, IV checked, risks and benefits discussed, surgical consent, monitors and equipment checked, pre-op evaluation, timeout performed and anesthesia consent Position: supine Lidocaine 1% used for infiltration and patient sedated Hand hygiene performed , maximum sterile barriers used  and Seldinger technique used Catheter size: 8.5 Fr PA cath was placed.Sheath introducer Swan type:thermodilution Procedure performed using ultrasound guided technique. Ultrasound Notes:anatomy identified, needle tip was noted to be adjacent to the nerve/plexus identified, no ultrasound evidence of intravascular and/or intraneural injection and image(s) printed for medical record Attempts: 1 Following insertion, line sutured, dressing applied and Biopatch. Post procedure assessment: blood return through all ports, free fluid flow and no air  Patient tolerated the procedure well with no immediate complications. Additional procedure comments: PA catheter:  Routine monitors. Timeout, sterile prep, drape, FBP R neck. Supine position.  1% Lido local, finder and trocar RIJ 1st pass with US guidance.  Cordis placed over J wire. PA catheter in easily.  Sterile dressing applied.  Patient tolerated well, VSS.  Jenita Seashore, MD.

## 2019-11-29 NOTE — Anesthesia Procedure Notes (Signed)
Arterial Line Insertion Start/End11/08/2019 7:05 AM, 11/29/2019 7:10 AM Performed by: Lowella Dell, CRNA, CRNA  Preanesthetic checklist: patient identified, IV checked, site marked, risks and benefits discussed, surgical consent, monitors and equipment checked, pre-op evaluation, timeout performed and anesthesia consent Lidocaine 1% used for infiltration and patient sedated Right, radial was placed Catheter size: 20 G Hand hygiene performed  and maximum sterile barriers used   Attempts: 1 Procedure performed without using ultrasound guided technique. Following insertion, Biopatch and dressing applied. Post procedure assessment: normal  Patient tolerated the procedure well with no immediate complications.

## 2019-11-29 NOTE — H&P (Signed)
History and Physical Interval Note:  11/29/2019 7:31 AM  Jamie Hodge  has presented today for surgery, with the diagnosis of CAD.  The various methods of treatment have been discussed with the patient and family. After consideration of risks, benefits and other options for treatment, the patient has consented to  Procedure(s) with comments: CORONARY ARTERY BYPASS GRAFTING (CABG) (N/A) - POSSIBLE BIMA RADIAL ARTERY HARVEST (Left) TRANSESOPHAGEAL ECHOCARDIOGRAM (TEE) (N/A) INDOCYANINE GREEN FLUORESCENCE IMAGING (ICG) (N/A) as a surgical intervention.  The patient's history has been reviewed, patient examined, no change in status, stable for surgery.  I have reviewed the patient's chart and labs.  Questions were answered to the patient's satisfaction.     Jamie Ladouceur Z Alvin Critchley, MD  Physician  Cardiothoracic Surgery  Consult Note    Signed  Date of Service:  11/26/2019 9:38 AM          Signed      Expand All Collapse All  Show:Clear all [x] Manual[x] Template[] Copied  Added by: [x] Orvan Seen, Glenice Bow, MD  [x] Hover for details      [place]       [place] [address]             [phone]                                       [name] Medical Record #[id number] Date of Birth: 05-20-1940  Referring: No ref. provider found Primary Care: [facility] Primary Cardiologist:Gayatri Stann Mainland, MD  Chief Complaint:   No chief complaint on file. h/o tia  History of Present Illness:     79 yo lady experienced right UE weakness and slurred speech in Sept 2021. 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 This was followed with CT coronary suggesting severe CAD, confirmed on LHC this past week. She was admitted for with plan for surgery but was medically stabilized and sent home. Now represents for CABG surgery. 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  Current Activity/ Functional Status: Patient will be independent with mobility/ambulation, transfers, ADL's, IADL's.   Zubrod Score: At the time of surgery this patient's most appropriate activity status/level should be described as: [] ?    0    Normal activity, no symptoms [x] ?    1    Restricted in physical strenuous activity but ambulatory, able to do out light work [] ?    2    Ambulatory and capable of self care, unable to do work activities, up and about                 more than 50%  Of the time                            [] ?    3    Only limited self care, in bed greater than 50% of waking hours [] ?    4    Completely disabled, no self care, confined to bed or chair [] ?    5    Moribund      Past Medical History:  Diagnosis Date  . CVA (cerebral vascular accident) ([place])   . Herpes zoster 2012  . Vertigo          Past Surgical History:  Procedure Laterality  Date  . LEFT HEART CATH AND CORONARY ANGIOGRAPHY N/A 11/23/2019   Procedure: LEFT HEART CATH AND CORONARY ANGIOGRAPHY;  Surgeon: Jettie Booze, MD;  Location: [facility];  Service: Cardiovascular;  Laterality: N/A;  . RHINOPLASTY      Social History      Tobacco Use  Smoking Status Never Smoker  Smokeless Tobacco Never Used    Social History      Substance and Sexual Activity  Alcohol Use No          Allergies  Allergen Reactions  . Mango Flavor Anaphylaxis  . Contrast Media [Iodinated Diagnostic Agents] Other (See Comments)    Unknown reaction    No current facility-administered medications for this encounter.         Current Outpatient Medications  Medication Sig Dispense Refill  . aspirin EC 81 MG tablet Take 81 mg by mouth daily. Swallow whole.    Marland Kitchen atorvastatin (LIPITOR) 80 MG tablet Take 1 tablet (80 mg total) by mouth daily. 90 tablet 3  . carvedilol (COREG) 6.25 MG tablet Take 1 tablet (6.25 mg total) by mouth 2 (two) times daily. 60 tablet 0  .  isosorbide mononitrate (IMDUR) 30 MG 24 hr tablet Take 1 tablet (30 mg total) by mouth daily. 30 tablet 0  . Multiple Vitamin (MULTIVITAMIN ADULT PO) Take 1 tablet by mouth daily.    . Multiple Vitamins-Minerals (PRESERVISION AREDS 2) CAPS Take 2 capsules by mouth daily.    . sertraline (ZOLOFT) 25 MG tablet Take 1 tablet (25 mg total) by mouth daily. (Patient taking differently: Take 25 mg by mouth daily as needed (anxiety). ) 90 tablet 3  . triamcinolone (NASACORT ALLERGY 24HR) 55 MCG/ACT AERO nasal inhaler Place 1 spray into the nose daily as needed (allergies).      No medications prior to admission.         Family History  Problem Relation Age of Onset  . Unexplained death Mother        Died at the age of 4, no autopsy performed  . CAD Father 45  . Lung cancer Father 66     Review of Systems:   ROS Pertinent items are noted in HPI.               Cardiac Review of Systems: Y or  [    ]= no             Chest Pain [    ]           Resting SOB [   ]         Exertional SOB  [  ]     Orthopnea [  ]             Pedal Edema [   ]        Palpitations [  ]            Syncope  [  ]    Presyncope [   ]             General Review of Systems: [Y] = yes [  ]=no Constitional: recent weight change [  ]; anorexia [  ]; fatigue [  ]; nausea [  ]; night sweats [  ]; fever [  ]; or chills [  ]  Dental: Last Dentist visit:              Eye : blurred vision [  ]; diplopia [   ]; vision changes [  ];  Amaurosis fugax[  ]; Resp: cough [  ];  wheezing[  ];  hemoptysis[  ]; shortness of breath[  ]; paroxysmal nocturnal dyspnea[  ]; dyspnea on exertion[  ]; or orthopnea[  ];  GI:  gallstones[  ], vomiting[  ];  dysphagia[  ]; melena[  ];  hematochezia [  ]; heartburn[  ];   Hx of  Colonoscopy[  ]; GU: kidney stones [  ]; hematuria[  ];   dysuria [  ];  nocturia[  ];  history of     obstruction [  ]; urinary frequency [  ]              Skin: rash, swelling[  ];, hair loss[  ];  peripheral edema[  ];  or itching[  ]; Musculosketetal: myalgias[  ];  joint swelling[  ];  joint erythema[  ];  joint pain[  ];  back pain[  ];             Heme/Lymph: bruising[  ];  bleeding[  ];  anemia[  ];  Neuro: TIA[  ];  headaches[  ];  stroke[  ];  vertigo[  ];  seizures[  ];   paresthesias[  ];  difficulty walking[  ];             Psych:depression[  ]; anxiety[  ];             Endocrine: diabetes[  ];  thyroid dysfunction[  ];               Physical Exam: There were no vitals taken for this visit.   General appearance: alert and cooperative Head: Normocephalic, without obvious abnormality, atraumatic Resp: clear to auscultation bilaterally Cardio: regular rate and rhythm, S1, S2 normal, no murmur, click, rub or gallop GI: soft, non-tender; bowel sounds normal; no masses,  no organomegaly Extremities: extremities normal, atraumatic, no cyanosis or edema Neurologic: Alert and oriented X 3, normal strength and tone. Normal symmetric reflexes. Normal coordination and gait  Diagnostic Studies & Laboratory data:     Recent Radiology Findings:    Imaging Results (Last 48 hours)  DG Chest Port 1 View  Result Date: 11/25/2019 CLINICAL DATA:  Preop evaluation. EXAM: PORTABLE CHEST 1 VIEW COMPARISON:  None. FINDINGS: Heart size and pulmonary vascularity within normal limits. Moderate elevation right hemidiaphragm. Lungs are well aerated and clear without infiltrate or effusion. IMPRESSION: No active disease. Electronically Signed   By: Franchot Gallo M.D.   On: 11/25/2019 11:01   VAS US DOPPLER PRE CABG  Result Date: 11/24/2019 PREOPERATIVE VASCULAR EVALUATION  Indications: Pre-CABG. Performing Technologist: Abram Sander RVS  Examination Guidelines: A complete evaluation includes B-mode imaging, spectral Doppler, color Doppler, and power Doppler as needed of all accessible portions of each vessel. Bilateral testing is considered  an integral part of a complete examination. Limited examinations for reoccurring indications may be performed as noted.  Right Carotid Findings: +----------+--------+--------+--------+------------+--------+           PSV cm/sEDV cm/sStenosisDescribe    Comments +----------+--------+--------+--------+------------+--------+ CCA Prox  62      13                                   +----------+--------+--------+--------+------------+--------+  CCA Distal62      17              heterogenous         +----------+--------+--------+--------+------------+--------+ ICA Prox  88      36      1-39%   heterogenous         +----------+--------+--------+--------+------------+--------+ ICA Distal78      25                                   +----------+--------+--------+--------+------------+--------+ ECA       221     15                                   +----------+--------+--------+--------+------------+--------+ Portions of this table do not appear on this page. +----------+--------+-------+--------+------------+           PSV cm/sEDV cmsDescribeArm Pressure +----------+--------+-------+--------+------------+ Subclavian81                                  +----------+--------+-------+--------+------------+ +---------+--------+--+--------+-+---------+ VertebralPSV cm/s40EDV cm/s7Antegrade +---------+--------+--+--------+-+---------+ Left Carotid Findings: +----------+--------+--------+--------+------------+--------+           PSV cm/sEDV cm/sStenosisDescribe    Comments +----------+--------+--------+--------+------------+--------+ CCA Prox  107     17              heterogenous         +----------+--------+--------+--------+------------+--------+ CCA Distal82      20              heterogenous         +----------+--------+--------+--------+------------+--------+ ICA Prox  83      29      1-39%   heterogenous          +----------+--------+--------+--------+------------+--------+ ICA Distal70      23                                   +----------+--------+--------+--------+------------+--------+ ECA       142     9                                    +----------+--------+--------+--------+------------+--------+ +----------+--------+--------+--------+------------+ SubclavianPSV cm/sEDV cm/sDescribeArm Pressure +----------+--------+--------+--------+------------+           100                                  +----------+--------+--------+--------+------------+ +---------+--------+--+--------+--+---------+ VertebralPSV cm/s41EDV cm/s12Antegrade +---------+--------+--+--------+--+---------+  ABI Findings: +--------+------------------+-----+---------+--------+ Right   Rt Pressure (mmHg)IndexWaveform Comment  +--------+------------------+-----+---------+--------+ Brachial                       triphasic         +--------+------------------+-----+---------+--------+ ATA                            triphasic         +--------+------------------+-----+---------+--------+ PTA                            triphasic         +--------+------------------+-----+---------+--------+ +--------+------------------+-----+---------+-------+  Left    Lt Pressure (mmHg)IndexWaveform Comment +--------+------------------+-----+---------+-------+ Brachial                       triphasic        +--------+------------------+-----+---------+-------+ ATA                            triphasic        +--------+------------------+-----+---------+-------+ PTA                            triphasic        +--------+------------------+-----+---------+-------+  Right Doppler Findings: +--------+--------+-----+---------+--------+ Site    PressureIndexDoppler  Comments +--------+--------+-----+---------+--------+ Brachial             triphasic          +--------+--------+-----+---------+--------+ Radial               triphasic         +--------+--------+-----+---------+--------+ Ulnar                triphasic         +--------+--------+-----+---------+--------+  Left Doppler Findings: +--------+--------+-----+---------+--------+ Site    PressureIndexDoppler  Comments +--------+--------+-----+---------+--------+ Brachial             triphasic         +--------+--------+-----+---------+--------+ Radial               triphasic         +--------+--------+-----+---------+--------+ Ulnar                triphasic         +--------+--------+-----+---------+--------+  Summary: Right Carotid: Velocities in the right ICA are consistent with a 1-39% stenosis. Left Carotid: Velocities in the left ICA are consistent with a 1-39% stenosis. Vertebrals: Bilateral vertebral arteries demonstrate antegrade flow. Right Upper Extremity: Doppler waveforms decrease 50% with right radial compression. Doppler waveforms remain within normal limits with right ulnar compression. Left Upper Extremity: Doppler waveforms decrease 50% with left radial compression. Doppler waveform obliterate with left ulnar compression.  Electronically signed by Curt Jews MD on 11/24/2019 at 3:05:55 PM.    Final       I have independently reviewed the above radiologic studies and discussed with the patient, including the results of her Left heart catheterization and coronary angiogram.   Recent Lab Findings: Recent Labs       Lab Results  Component Value Date   WBC 11.4 (H) 11/25/2019   HGB 12.3 11/25/2019   HCT 37.4 11/25/2019   PLT 237 11/25/2019   GLUCOSE 98 11/25/2019   CHOL 335 (H) 09/25/2019   TRIG 226 (H) 09/25/2019   HDL 32 (L) 09/25/2019   LDLCALC 258 (H) 09/25/2019   NA 138 11/25/2019   K 3.8 11/25/2019   CL 107 11/25/2019   CREATININE 0.71 11/25/2019   BUN 18 11/25/2019   CO2 22 11/25/2019   INR 1.0 11/25/2019   HGBA1C 5.8  (H) 09/25/2019        Assessment / Plan:      79 yo lady with multivessel CAD discovered somewhat incidentally after TIA. She is referred for CABG and is considered an excellent candidate. Will proceed on 11/29/19.    I  spent 30 minutes counseling the patient face to face.   Cherisse Carrell Z. Orvan Seen, MD [phone] 11/26/2019 9:38 AM              Routing  History           Note Details  Author Wonda Olds, MD File Time 11/26/2019 9:44 AM  Author Type Physician Status Signed  Last Editor Wonda Olds, MD Service Cardiothoracic [place] # [id number] Admit Date 11/29/2019

## 2019-11-30 ENCOUNTER — Inpatient Hospital Stay (HOSPITAL_COMMUNITY): Payer: Medicare Other

## 2019-11-30 ENCOUNTER — Encounter (HOSPITAL_COMMUNITY): Payer: Self-pay | Admitting: Cardiothoracic Surgery

## 2019-11-30 DIAGNOSIS — J9601 Acute respiratory failure with hypoxia: Secondary | ICD-10-CM

## 2019-11-30 LAB — BASIC METABOLIC PANEL
Anion gap: 6 (ref 5–15)
Anion gap: 8 (ref 5–15)
BUN: 11 mg/dL (ref 8–23)
BUN: 14 mg/dL (ref 8–23)
CO2: 25 mmol/L (ref 22–32)
CO2: 25 mmol/L (ref 22–32)
Calcium: 7.7 mg/dL — ABNORMAL LOW (ref 8.9–10.3)
Calcium: 7.9 mg/dL — ABNORMAL LOW (ref 8.9–10.3)
Chloride: 107 mmol/L (ref 98–111)
Chloride: 103 mmol/L (ref 98–111)
Creatinine, Ser: 0.62 mg/dL (ref 0.44–1.00)
Creatinine, Ser: 0.68 mg/dL (ref 0.44–1.00)
GFR, Estimated: >60 mL/min (ref >60)
GFR, Estimated: >60 mL/min (ref >60)
Glucose, Bld: 131 mg/dL — ABNORMAL HIGH (ref 70–99)
Glucose, Bld: 129 mg/dL — ABNORMAL HIGH (ref 70–99)
Potassium: 3.8 mmol/L (ref 3.5–5.1)
Potassium: 3.6 mmol/L (ref 3.5–5.1)
Sodium: 138 mmol/L (ref 135–145)
Sodium: 136 mmol/L (ref 135–145)

## 2019-11-30 LAB — CBC
HCT: 33.1 % — ABNORMAL LOW (ref 36.0–46.0)
HCT: 34.3 % — ABNORMAL LOW (ref 36.0–46.0)
Hemoglobin: 10.8 g/dL — ABNORMAL LOW (ref 12.0–15.0)
Hemoglobin: 11.2 g/dL — ABNORMAL LOW (ref 12.0–15.0)
MCH: 29.4 pg (ref 26.0–34.0)
MCH: 30.4 pg (ref 26.0–34.0)
MCHC: 32.6 g/dL (ref 30.0–36.0)
MCHC: 32.7 g/dL (ref 30.0–36.0)
MCV: 90.2 fL (ref 80.0–100.0)
MCV: 93 fL (ref 80.0–100.0)
Platelets: 123 10*3/uL — ABNORMAL LOW (ref 150–400)
Platelets: 143 10*3/uL — ABNORMAL LOW (ref 150–400)
RBC: 3.67 MIL/uL — ABNORMAL LOW (ref 3.87–5.11)
RBC: 3.69 MIL/uL — ABNORMAL LOW (ref 3.87–5.11)
RDW: 14.6 % (ref 11.5–15.5)
RDW: 14.8 % (ref 11.5–15.5)
WBC: 11.6 10*3/uL — ABNORMAL HIGH (ref 4.0–10.5)
WBC: 15.5 10*3/uL — ABNORMAL HIGH (ref 4.0–10.5)
nRBC: 0 % (ref 0.0–0.2)
nRBC: 0 % (ref 0.0–0.2)

## 2019-11-30 LAB — BPAM FFP
Blood Product Expiration Date: 202111112359
Blood Product Expiration Date: 202111112359
ISSUE DATE / TIME: 202111081806
ISSUE DATE / TIME: 202111081806
Unit Type and Rh: 5100
Unit Type and Rh: 5100

## 2019-11-30 LAB — BPAM PLATELET PHERESIS
Blood Product Expiration Date: 202111082359
Blood Product Expiration Date: 202111082359
Unit Type and Rh: 6200
Unit Type and Rh: 6200

## 2019-11-30 LAB — POCT I-STAT 7, (LYTES, BLD GAS, ICA,H+H)
Acid-Base Excess: 0 mmol/L (ref 0.0–2.0)
Bicarbonate: 27 mmol/L (ref 20.0–28.0)
Calcium, Ion: 1.16 mmol/L (ref 1.15–1.40)
HCT: 32 % — ABNORMAL LOW (ref 36.0–46.0)
Hemoglobin: 10.9 g/dL — ABNORMAL LOW (ref 12.0–15.0)
O2 Saturation: 86 %
Patient temperature: 37
Potassium: 4 mmol/L (ref 3.5–5.1)
Sodium: 142 mmol/L (ref 135–145)
TCO2: 29 mmol/L (ref 22–32)
pCO2 arterial: 53.1 mmHg — ABNORMAL HIGH (ref 32.0–48.0)
pH, Arterial: 7.315 — ABNORMAL LOW (ref 7.350–7.450)
pO2, Arterial: 57 mmHg — ABNORMAL LOW (ref 83.0–108.0)

## 2019-11-30 LAB — GLUCOSE, CAPILLARY
Glucose-Capillary: 100 mg/dL — ABNORMAL HIGH (ref 70–99)
Glucose-Capillary: 101 mg/dL — ABNORMAL HIGH (ref 70–99)
Glucose-Capillary: 103 mg/dL — ABNORMAL HIGH (ref 70–99)
Glucose-Capillary: 116 mg/dL — ABNORMAL HIGH (ref 70–99)
Glucose-Capillary: 117 mg/dL — ABNORMAL HIGH (ref 70–99)
Glucose-Capillary: 119 mg/dL — ABNORMAL HIGH (ref 70–99)
Glucose-Capillary: 120 mg/dL — ABNORMAL HIGH (ref 70–99)
Glucose-Capillary: 120 mg/dL — ABNORMAL HIGH (ref 70–99)
Glucose-Capillary: 129 mg/dL — ABNORMAL HIGH (ref 70–99)
Glucose-Capillary: 129 mg/dL — ABNORMAL HIGH (ref 70–99)
Glucose-Capillary: 130 mg/dL — ABNORMAL HIGH (ref 70–99)
Glucose-Capillary: 137 mg/dL — ABNORMAL HIGH (ref 70–99)
Glucose-Capillary: 143 mg/dL — ABNORMAL HIGH (ref 70–99)
Glucose-Capillary: 151 mg/dL — ABNORMAL HIGH (ref 70–99)

## 2019-11-30 LAB — PREPARE FRESH FROZEN PLASMA
Unit division: 0
Unit division: 0

## 2019-11-30 LAB — PREPARE PLATELET PHERESIS
Unit division: 0
Unit division: 0

## 2019-11-30 LAB — LIPID PANEL
Cholesterol: 62 mg/dL (ref 0–200)
HDL: 20 mg/dL — ABNORMAL LOW (ref >40)
LDL Cholesterol: 28 mg/dL (ref 0–99)
Total CHOL/HDL Ratio: 3.1 RATIO
Triglycerides: 68 mg/dL (ref <150)
VLDL: 14 mg/dL (ref 0–40)

## 2019-11-30 LAB — MAGNESIUM
Magnesium: 2.5 mg/dL — ABNORMAL HIGH (ref 1.7–2.4)
Magnesium: 2.7 mg/dL — ABNORMAL HIGH (ref 1.7–2.4)

## 2019-11-30 LAB — COOXEMETRY PANEL
Carboxyhemoglobin: 1 % (ref 0.5–1.5)
Methemoglobin: 0.9 % (ref 0.0–1.5)
O2 Saturation: 76.9 %
Total hemoglobin: 11.2 g/dL — ABNORMAL LOW (ref 12.0–16.0)

## 2019-11-30 MED ORDER — TRAMADOL HCL 50 MG PO TABS
50.0000 mg | ORAL_TABLET | ORAL | Status: DC | PRN
  Administered 2019-11-30: 50 mg via ORAL
  Filled 2019-11-30: qty 1

## 2019-11-30 MED ORDER — POTASSIUM CHLORIDE 10 MEQ/50ML IV SOLN
10.0000 meq | INTRAVENOUS | Status: AC
  Administered 2019-11-30 (×3): 10 meq via INTRAVENOUS
  Filled 2019-11-30 (×3): qty 50

## 2019-11-30 MED ORDER — AMIODARONE HCL IN DEXTROSE 360-4.14 MG/200ML-% IV SOLN
60.0000 mg/h | INTRAVENOUS | Status: AC
  Administered 2019-11-30 (×2): 60 mg/h via INTRAVENOUS
  Filled 2019-11-30 (×3): qty 200

## 2019-11-30 MED ORDER — FUROSEMIDE 10 MG/ML IJ SOLN
40.0000 mg | Freq: Once | INTRAMUSCULAR | Status: AC
  Administered 2019-11-30: 40 mg via INTRAVENOUS
  Filled 2019-11-30: qty 4

## 2019-11-30 MED ORDER — COLCHICINE 0.3 MG HALF TABLET
0.3000 mg | ORAL_TABLET | Freq: Two times a day (BID) | ORAL | Status: DC
  Administered 2019-11-30 – 2019-12-01 (×4): 0.3 mg via ORAL
  Filled 2019-11-30 (×5): qty 1

## 2019-11-30 MED ORDER — INSULIN ASPART 100 UNIT/ML ~~LOC~~ SOLN
0.0000 [IU] | SUBCUTANEOUS | Status: DC
  Administered 2019-11-30 – 2019-12-01 (×4): 2 [IU] via SUBCUTANEOUS
  Administered 2019-12-02: 4 [IU] via SUBCUTANEOUS
  Administered 2019-12-03 – 2019-12-05 (×4): 2 [IU] via SUBCUTANEOUS

## 2019-11-30 MED ORDER — KETOROLAC TROMETHAMINE 30 MG/ML IJ SOLN
30.0000 mg | Freq: Once | INTRAMUSCULAR | Status: AC
  Administered 2019-11-30: 30 mg via INTRAVENOUS
  Filled 2019-11-30: qty 1

## 2019-11-30 MED ORDER — ISOSORBIDE DINITRATE 10 MG PO TABS
10.0000 mg | ORAL_TABLET | Freq: Three times a day (TID) | ORAL | Status: DC
  Administered 2019-11-30 – 2019-12-05 (×16): 10 mg via ORAL
  Filled 2019-11-30 (×17): qty 1

## 2019-11-30 MED ORDER — AMIODARONE LOAD VIA INFUSION
150.0000 mg | Freq: Once | INTRAVENOUS | Status: AC
  Administered 2019-11-30: 150 mg via INTRAVENOUS
  Filled 2019-11-30: qty 83.34

## 2019-11-30 MED ORDER — AMIODARONE HCL IN DEXTROSE 360-4.14 MG/200ML-% IV SOLN
30.0000 mg/h | INTRAVENOUS | Status: DC
  Administered 2019-11-30 – 2019-12-03 (×6): 30 mg/h via INTRAVENOUS
  Filled 2019-11-30 (×5): qty 200

## 2019-11-30 MED ORDER — LEVALBUTEROL HCL 0.63 MG/3ML IN NEBU
0.6300 mg | INHALATION_SOLUTION | Freq: Four times a day (QID) | RESPIRATORY_TRACT | Status: DC
  Administered 2019-11-30 (×3): 0.63 mg via RESPIRATORY_TRACT
  Filled 2019-11-30 (×3): qty 3

## 2019-11-30 MED FILL — Magnesium Sulfate Inj 50%: INTRAMUSCULAR | Qty: 10 | Status: AC

## 2019-11-30 MED FILL — Heparin Sodium (Porcine) Inj 1000 Unit/ML: INTRAMUSCULAR | Qty: 30 | Status: AC

## 2019-11-30 MED FILL — Potassium Chloride Inj 2 mEq/ML: INTRAVENOUS | Qty: 40 | Status: AC

## 2019-11-30 NOTE — Procedures (Signed)
Extubation Procedure Note  Patient Details:   Name: STEPFANIE YOTT DOB: Sep 22, 1940 MRN: [id number]   Airway Documentation:    Vent end date: 11/30/19 Vent end time: 1037   Evaluation  O2 sats: stable throughout Complications: No apparent complications Patient did tolerate procedure well. Bilateral Breath Sounds: Clear   Yes, pt could speak post extubaton.  Pt extubated to 4 l/m [place] per physician order.  Earney Navy 11/30/2019, 10:38 AM

## 2019-11-30 NOTE — Addendum Note (Signed)
Addendum  created 11/30/19 1001 by Josephine Igo, CRNA   Order list changed

## 2019-11-30 NOTE — Discharge Instructions (Signed)
[facility] [phone]    Endoscopic Saphenous Vein Harvesting, Care After This sheet gives you information about how to care for yourself after your procedure. Your health care provider may also give you more specific instructions. If you have problems or questions, contact your health care provider. What can I expect after the procedure? After the procedure, it is common to have:  Pain.  Bruising.  Swelling.  Numbness. Follow these instructions at home: Incision care   Follow instructions from your health care provider about how to take care of your incisions. Make sure you: ? Wash your hands with soap and water before and after you change your bandages (dressings). If soap and water are not available, use hand sanitizer. ? Change your dressings as told by your health care provider. ? Leave stitches (sutures), skin glue, or adhesive strips in place. These skin closures may need to stay in place for 2 weeks or longer. If adhesive strip edges start to loosen and curl up, you may trim the loose edges. Do not remove adhesive strips completely unless your health care provider tells you to do that.  Check your incision areas every day for signs of infection. Check for: ? More redness, swelling, or pain. ? Fluid or blood. ? Warmth. ? Pus or a bad smell. Medicines  Take over-the-counter and prescription medicines only as told by your health care provider.  Ask your health care provider if the medicine prescribed to you requires you to avoid driving or using heavy machinery. General instructions  Raise (elevate) your legs above the level of your heart while you are sitting or lying down.  Avoid crossing your legs.  Avoid sitting for long periods of time. Change positions every 30 minutes.  Do any exercises your health care providers have given you. These may include deep breathing, coughing, and walking exercises.  Do not take baths, swim, or use a hot tub until your health care  provider approves. Ask your health care provider if you may take showers. You may only be allowed to take sponge baths.  Wear compression stockings as told by your health care provider. These stockings help to prevent blood clots and reduce swelling in your legs.  Keep all follow-up visits as told by your health care provider. This is important. Contact a health care provider if:  Medicine does not help your pain.  Your pain gets worse.  You have new leg bruises or your leg bruises get bigger.  Your leg feels numb.  You have more redness, swelling, or pain around your incision.  You have fluid or blood coming from your incision.  Your incision feels warm to the touch.  You have pus or a bad smell coming from your incision.  You have a fever. Get help right away if:  Your pain is severe.  You develop pain, tenderness, warmth, redness, or swelling in any part of your leg.  You have chest pain.  You have trouble breathing. Summary  Raise (elevate) your legs above the level of your heart while you are sitting or lying down.  Wear compression stockings as told by your health care provider.  Make sure you know which symptoms should prompt you to contact your health care provider.  Keep all follow-up visits as told by your health care provider. This information is not intended to replace advice given to you by your health care provider. Make sure you discuss any questions you have with your health care provider. Document Revised: 12/15/2017 Document  Reviewed: 12/15/2017 Elsevier Patient Education  [place]. Coronary Artery Bypass Grafting, Care After This sheet gives you information about how to care for yourself after your procedure. Your doctor may also give you more specific instructions. If you have problems or questions, call your doctor. What can I expect after the procedure? After the procedure, it is common to:  Feel sick to your stomach (nauseous).  Not  want to eat as much as normal (lack of appetite).  Have trouble pooping (constipation).  Have weakness and tiredness (fatigue).  Feel sad (depressed) or grouchy (irritable).  Have pain or discomfort around the cuts from surgery (incisions). Follow these instructions at home: Medicines  Take over-the-counter and prescription medicines only as told by your doctor. Do not stop taking medicines or start any new medicines unless your doctor says it is okay.  If you were prescribed an antibiotic medicine, take it as told by your doctor. Do not stop taking the antibiotic even if you start to feel better. Incision care   Follow instructions from your doctor about how to take care of your cuts from surgery. Make sure you: ? Wash your hands with soap and water before and after you change your bandage (dressing). If you cannot use soap and water, use hand sanitizer. ? Change your bandage as told by your doctor. ? Leave stitches (sutures), skin glue, or skin tape (adhesive) strips in place. They may need to stay in place for 2 weeks or longer. If tape strips get loose and curl up, you may trim the loose edges. Do not remove tape strips completely unless your doctor says it is okay.  Make sure the surgery cuts are clean, dry, and protected.  Check your cut areas every day for signs of infection. Check for: ? More redness, swelling, or pain. ? More fluid or blood. ? Warmth. ? Pus or a bad smell.  If cuts were made in your legs: ? Avoid crossing your legs. ? Avoid sitting for long periods of time. Change positions every 30 minutes. ? Raise (elevate) your legs when you are sitting. Bathing  Do not take baths, swim, or use a hot tub until your doctor says it is okay.  You may shower. Pat the surgery cuts dry. Do not rub the cuts to dry. Eating and drinking   Eat foods that are high in fiber, such as beans, nuts, whole grains, and raw fruits and vegetables. Any meats you eat should be lean  cut. Avoid canned, processed, and fried foods. This can help prevent trouble pooping. This is also a part of a heart-healthy diet.  Drink enough fluid to keep your pee (urine) pale yellow.  Do not drink alcohol until you are fully recovered. Ask your doctor when it is safe to drink alcohol. Activity  Rest and limit your activity as told by your doctor. You may be told to: ? Stop any activity right away if you have chest pain, shortness of breath, irregular heartbeats, or dizziness. Get help right away if you have any of these symptoms. ? Move around often for short periods or take short walks as told by your doctor. Slowly increase your activities. ? Avoid lifting, pushing, or pulling anything that is heavier than 10 lb (4.5 kg) for at least 6 weeks or as told by your doctor.  Do physical therapy or a cardiac rehab (cardiac rehabilitation) program as told by your doctor. ? Physical therapy involves doing exercises to maintain movement and build strength and  endurance. ? A cardiac rehab program includes:  Exercise training.  Education.  Counseling.  Do not drive until your doctor says it is okay.  Ask your doctor when you can go back to work.  Ask your doctor when you can be sexually active. General instructions  Do not drive or use heavy machinery while taking prescription pain medicine.  Do not use any products that contain nicotine or tobacco. These include cigarettes, e-cigarettes, and chewing tobacco. If you need help quitting, ask your doctor.  Take 2-3 deep breaths every few hours during the day while you get better. This helps expand your lungs and prevent problems.  If you were given a device called an incentive spirometer, use it several times a day to practice deep breathing. Support your chest with a pillow or your arms when you take deep breaths or cough.  Wear compression stockings as told by your doctor.  Weigh yourself every day. This helps to see if your body is  holding (retaining) fluid that may make your heart and lungs work harder.  Keep all follow-up visits as told by your doctor. This is important. Contact a doctor if:  You have more redness, swelling, or pain around any cut.  You have more fluid or blood coming from any cut.  Any cut feels warm to the touch.  You have pus or a bad smell coming from any cut.  You have a fever.  You have swelling in your ankles or legs.  You have pain in your legs.  You gain 2 lb (0.9 kg) or more a day.  You feel sick to your stomach or you throw up (vomit).  You have watery poop (diarrhea). Get help right away if:  You have chest pain that goes to your jaw or arms.  You are short of breath.  You have a fast or irregular heartbeat.  You notice a "clicking" in your breastbone (sternum) when you move.  You have any signs of a stroke. "BE FAST" is an easy way to remember the main warning signs: ? B - Balance. Signs are dizziness, sudden trouble walking, or loss of balance. ? E - Eyes. Signs are trouble seeing or a change in how you see. ? F - Face. Signs are sudden weakness or loss of feeling of the face, or the face or eyelid drooping on one side. ? A - Arms. Signs are weakness or loss of feeling in an arm. This happens suddenly and usually on one side of the body. ? S - Speech. Signs are sudden trouble speaking, slurred speech, or trouble understanding what people say. ? T - Time. Time to call emergency services. Write down what time symptoms started.  You have other signs of a stroke, such as: ? A sudden, very bad headache with no known cause. ? Feeling sick to your stomach. ? Throwing up. ? Jerky movements you cannot control (seizure). These symptoms may be an emergency. Do not wait to see if the symptoms will go away. Get medical help right away. Call your local emergency services (911 in the [place] Do not drive yourself to the hospital. Summary  After the procedure, it is common to  have pain or discomfort in the cuts from surgery (incisions).  Do not take baths, swim, or use a hot tub until your doctor says it is okay.  Slowly increase your activities. You may need physical therapy or cardiac rehab.  Weigh yourself every day. This helps to see if your  body is holding fluid. This information is not intended to replace advice given to you by your health care provider. Make sure you discuss any questions you have with your health care provider. Document Revised: 09/16/2017 Document Reviewed: 09/16/2017 Elsevier Patient Education  2020 [employer].

## 2019-11-30 NOTE — Progress Notes (Signed)
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 USING LEFT INTERNAL MAMMARY ARTERY, LEFT RADIAL ARTERY AND RIGHT GREATER SAPHENOUS VEIN. (N/A) RADIAL ARTERY HARVEST (Left) TRANSESOPHAGEAL ECHOCARDIOGRAM (TEE) (N/A) INDOCYANINE GREEN FLUORESCENCE IMAGING (ICG) (N/A) ENDOVEIN HARVEST OF GREATER SAPHENOUS VEIN (Right) Subjective: Sedated/intubated  Objective: Vital signs in last 24 hours: Temp:  [96.3 F (35.7 C)-99 F (37.2 C)] 98.6 F (37 C) (11/09 0700) Pulse Rate:  [53-112] 89 (11/09 0700) Cardiac Rhythm: Atrial paced (11/09 0719) Resp:  [0-28] 16 (11/09 0700) BP: (92-123)/(57-76) 104/64 (11/09 0700) SpO2:  [88 %-100 %] 95 % (11/09 0700) Arterial Line BP: (89-129)/(50-73) 107/52 (11/09 0700) FiO2 (%):  [40 %-50 %] 50 % (11/09 0552) Weight:  [92 kg] 92 kg (11/09 0500)  Hemodynamic parameters for last 24 hours: PAP: (18-44)/(0-27) 24/15 CO:  [2.3 L/min-4.1 L/min] 3.5 L/min CI:  [1.2 L/min/m2-2.2 L/min/m2] 1.9 L/min/m2  Intake/Output from previous day: 11/08 0701 - 11/09 0700 In: 8296.4 [I.V.:3668.1; Blood:1928; IV Piggyback:2700.3] Out: 4540 [Urine:1658; Blood:750; Chest Tube:830] Intake/Output this shift: No intake/output data recorded.  General appearance: alert and intubated Neurologic: unable to fully assess Heart: regular rate and rhythm, S1, S2 normal, no murmur, click, rub or gallop Lungs: clear to auscultation bilaterally Abdomen: soft, non-tender; bowel sounds normal; no masses,  no organomegaly Extremities: extremities normal, atraumatic, no cyanosis or edema Wound: dressed  Lab Results: Recent Labs    11/29/19 1920 11/29/19 2214 11/30/19 0459 11/30/19 0545  WBC 12.9*  --  11.6*  --   HGB 11.0*   < > 10.8* 10.9*  HCT 33.4*   < > 33.1* 32.0*  PLT 115*  --  123*  --    < > = values in this interval not displayed.   BMET:  Recent Labs    11/29/19 1920 11/29/19 2214 11/30/19 0459 11/30/19 0545  NA 138   < > 138  142  K 4.3   < > 3.8 4.0  CL 106  --  107  --   CO2 25  --  25  --   GLUCOSE 145*  --  131*  --   BUN 10  --  11  --   CREATININE 0.58  --  0.62  --   CALCIUM 7.5*  --  7.7*  --    < > = values in this interval not displayed.    PT/INR:  Recent Labs    11/29/19 1351  LABPROT 17.5*  INR 1.5*   ABG    Component Value Date/Time   PHART 7.315 (L) 11/30/2019 0545   HCO3 27.0 11/30/2019 0545   TCO2 29 11/30/2019 0545   ACIDBASEDEF 1.0 11/29/2019 2214   O2SAT 86.0 11/30/2019 0545   CBG (last 3)  Recent Labs    11/30/19 0409 11/30/19 0506 11/30/19 0557  GLUCAP 119* 137* 120*    Assessment/Plan: S/P Procedure(s) (LRB): CORONARY ARTERY BYPASS GRAFTING (CABG) x 6 ON PUMP. USING LEFT INTERNAL MAMMARY ARTERY, LEFT RADIAL ARTERY AND RIGHT GREATER SAPHENOUS VEIN. (N/A) RADIAL ARTERY HARVEST (Left) TRANSESOPHAGEAL ECHOCARDIOGRAM (TEE) (N/A) INDOCYANINE GREEN FLUORESCENCE IMAGING (ICG) (N/A) ENDOVEIN HARVEST OF GREATER SAPHENOUS VEIN (Right) Mobilize CCM consult for assistance with vent wean   LOS: 1 day    Wonda Olds 11/30/2019

## 2019-11-30 NOTE — Progress Notes (Signed)
Failed Rapid Wean Protocol:  SPO2 on ABG 91% VC 3.2L Minimal to no cuff leak  Will rest and attempt wean later.Placed back on full support.

## 2019-11-30 NOTE — Progress Notes (Signed)
NAME:  Jamie Hodge, MRN:  [id number], DOB:  03/19/1940, LOS: 1 ADMISSION DATE:  11/29/2019, CONSULTATION DATE: 11/30/2019 REFERRING MD: Rosaria Ferries, CHIEF COMPLAINT: Failure to extubate  HPI/course in hospital  79 year old woman who underwent uneventful aorto coronary bypass x6 yesterday.  Failed attempted wean twice.  Elective bypass for stable coronary disease found relatively incidentally as part of work-up following recent stroke.  Found to have extensive coronary disease on coronary CT angio which was confirmed by coronary angiography.  LVEDP normal at the time of catheterization. Preoperative PFTs showed mild restrictive lung disease.  Intraoperative echocardiogram showed normal LV function with minimal mitral regurgitation at the end of surgery.  No difficulty weaning from cardiopulmonary bypass.   Past Medical History   Past Medical History:  Diagnosis Date  . Coronary artery disease   . CVA (cerebral vascular accident) ([place])   . Herpes zoster 2012  . Vertigo      Past Surgical History:  Procedure Laterality Date  . CARDIAC CATHETERIZATION    . CORONARY ARTERY BYPASS GRAFT N/A 11/29/2019   Procedure: CORONARY ARTERY BYPASS GRAFTING (CABG) x 6 ON PUMP. USING LEFT INTERNAL MAMMARY ARTERY, LEFT RADIAL ARTERY AND RIGHT GREATER SAPHENOUS VEIN.;  Surgeon: Wonda Olds, MD;  Location: [facility];  Service: Open Heart Surgery;  Laterality: N/A;  . ENDOVEIN HARVEST OF GREATER SAPHENOUS VEIN Right 11/29/2019   Procedure: ENDOVEIN HARVEST OF GREATER SAPHENOUS VEIN;  Surgeon: Wonda Olds, MD;  Location: [facility];  Service: Open Heart Surgery;  Laterality: Right;  . LEFT HEART CATH AND CORONARY ANGIOGRAPHY N/A 11/23/2019   Procedure: LEFT HEART CATH AND CORONARY ANGIOGRAPHY;  Surgeon: Jettie Booze, MD;  Location: [facility];  Service: Cardiovascular;  Laterality: N/A;  . RADIAL ARTERY HARVEST Left 11/29/2019   Procedure: RADIAL ARTERY HARVEST;  Surgeon: Wonda Olds, MD;  Location: [facility];  Service: Open Heart Surgery;  Laterality: Left;  . RHINOPLASTY    . TEE WITHOUT CARDIOVERSION N/A 11/29/2019   Procedure: TRANSESOPHAGEAL ECHOCARDIOGRAM (TEE);  Surgeon: Wonda Olds, MD;  Location: [facility];  Service: Open Heart Surgery;  Laterality: N/A;     Review of Systems:   Review of Systems  Unable to perform ROS: Critical illness    Social History   reports that she has never smoked. She has never used smokeless tobacco. She reports that she does not drink alcohol and does not use drugs.   Family History   Her family history includes CAD (age of onset: 52) in her father; Lung cancer (age of onset: 57) in her father; Unexplained death in her mother.   Allergies Allergies  Allergen Reactions  . Mango Flavor Anaphylaxis  . Contrast Media [Iodinated Diagnostic Agents] Other (See Comments)    Unknown reaction     Home Medications  Prior to Admission medications   Medication Sig Start Date End Date Taking? Authorizing Provider  aspirin EC 81 MG tablet Take 81 mg by mouth daily. Swallow whole.   Yes [provider]  atorvastatin (LIPITOR) 80 MG tablet Take 1 tablet (80 mg total) by mouth daily. 10/28/19  Yes McCue, Janett Billow, NP  carvedilol (COREG) 6.25 MG tablet Take 1 tablet (6.25 mg total) by mouth 2 (two) times daily. 11/25/19  Yes Reino Bellis B, NP  isosorbide mononitrate (IMDUR) 30 MG 24 hr tablet Take 1 tablet (30 mg total) by mouth daily. 11/25/19  Yes Jettie Booze, MD  Multiple Vitamin (MULTIVITAMIN ADULT PO) Take 1 tablet by  mouth daily.   Yes [provider]  Multiple Vitamins-Minerals (PRESERVISION AREDS 2) CAPS Take 2 capsules by mouth daily.   Yes [provider]  sertraline (ZOLOFT) 25 MG tablet Take 1 tablet (25 mg total) by mouth daily. Patient taking differently: Take 25 mg by mouth daily as needed (anxiety).  10/28/19  Yes McCue, Janett Billow, NP  triamcinolone (NASACORT ALLERGY 24HR) 55 MCG/ACT  AERO nasal inhaler Place 1 spray into the nose daily as needed (allergies).   Yes [provider]     Interim history/subjective:  This morning on evaluation the patient is awake and endorses pain on deep inspiration.  Objective   Blood pressure 102/64, pulse 90, temperature 98.6 F (37 C), temperature source Core, resp. rate 12, height 5\' 4"  (1.626 m), weight 92 kg, SpO2 97 %. PAP: (18-44)/(7-27) 24/12 CO:  [2.3 L/min-4.1 L/min] 4.1 L/min CI:  [1.2 L/min/m2-2.2 L/min/m2] 2.2 L/min/m2  Vent Mode: PRVC;SIMV;PSV FiO2 (%):  [40 %-50 %] 50 % Set Rate:  [4 bmp-12 bmp] 12 bmp Vt Set:  [430 mL] 430 mL PEEP:  [5 cmH20] 5 cmH20 Pressure Support:  [5 cmH20-10 cmH20] 5 cmH20 Plateau Pressure:  [17 cmH20-21 cmH20] 20 cmH20   Intake/Output Summary (Last 24 hours) at 11/30/2019 1353 Last data filed at 11/30/2019 1200 Gross per 24 hour  Intake 4688.22 ml  Output 1510 ml  Net 3178.22 ml   Filed Weights   11/30/19 0500  Weight: 92 kg    Examination: Physical Exam Constitutional:      General: She is not in acute distress.    Appearance: She is obese. She is not toxic-appearing.  HENT:     Head:     Comments: Intubated with 8 mm ET tube.  Cuff leak present.  OG tube in place Cardiovascular:     Rate and Rhythm: Normal rate.     Heart sounds: Normal heart sounds. No friction rub.  Pulmonary:     Effort: Pulmonary effort is normal.     Breath sounds: Normal breath sounds.     Comments: Limited chest excursion.  Pain with deep inspiration. Chest:     Chest wall: Tenderness present.     Comments: Midline sternotomy incision.  Minimal chest tube drainage. Abdominal:     Palpations: Abdomen is soft.  Neurological:     Mental Status: She is alert.     GCS: GCS eye subscore is 4. GCS verbal subscore is 5. GCS motor subscore is 6.     Comments: No focal deficits      Ancillary tests (personally reviewed)  CBC: Recent Labs  Lab 11/23/19 1633 11/23/19 1633 11/25/19 0144  11/29/19 0924 11/29/19 1132 11/29/19 1139 11/29/19 1351 11/29/19 1354 11/29/19 1836 11/29/19 1920 11/29/19 2214 11/30/19 0459 11/30/19 0545  WBC 12.7*  --  11.4*  --   --   --  20.4*  --   --  12.9*  --  11.6*  --   HGB 14.1   < > 12.3   < > 8.5*   < > 9.7*   < > 11.2* 11.0* 10.9* 10.8* 10.9*  HCT 42.3   < > 37.4   < > 25.3*   < > 30.1*   < > 33.0* 33.4* 32.0* 33.1* 32.0*  MCV 89.1  --  91.0  --   --   --  92.0  --   --  90.5  --  90.2  --   PLT 261   < > 237  --  152  --  137*  --   --  115*  --  123*  --    < > = values in this interval not displayed.    Basic Metabolic Panel: Recent Labs  Lab 11/25/19 0144 11/25/19 0144 11/29/19 0725 11/29/19 0924 11/29/19 1027 11/29/19 1112 11/29/19 1139 11/29/19 1234 11/29/19 1238 11/29/19 1354 11/29/19 1836 11/29/19 1920 11/29/19 2214 11/30/19 0459 11/30/19 0545  NA 138   < > 137   < > 137   < > 135   < > 140   < > 139 138 141 138 142  K 3.8   < > 3.8   < > 4.2   < > 4.4   < > 3.5   < > 4.8 4.3 4.0 3.8 4.0  CL 107   < > 102   < > 99  --  100  --  100  --   --  106  --  107  --   CO2 22  --  26  --   --   --   --   --   --   --   --  25  --  25  --   GLUCOSE 98   < > 123*   < > 127*  --  160*  --  150*  --   --  145*  --  131*  --   BUN 18   < > 13   < > 11  --  11  --  10  --   --  10  --  11  --   CREATININE 0.71   < > 0.69   < > 0.50  --  0.40*  --  0.50  --   --  0.58  --  0.62  --   CALCIUM 9.1  --  9.1  --   --   --   --   --   --   --   --  7.5*  --  7.7*  --   MG  --   --   --   --   --   --   --   --   --   --   --   --   --  2.5*  --    < > = values in this interval not displayed.   GFR: Estimated Creatinine Clearance: 64.7 mL/min (by C-G formula based on SCr of 0.62 mg/dL). Recent Labs  Lab 11/25/19 0144 11/29/19 1351 11/29/19 1920 11/30/19 0459  WBC 11.4* 20.4* 12.9* 11.6*    Liver Function Tests: Recent Labs  Lab 11/29/19 0725  AST 18  ALT 32  ALKPHOS 98  BILITOT 0.6  PROT 6.4*  ALBUMIN 3.4*   No  results for input(s): LIPASE, AMYLASE in the last 168 hours. No results for input(s): AMMONIA in the last 168 hours.  ABG    Component Value Date/Time   PHART 7.315 (L) 11/30/2019 0545   PCO2ART 53.1 (H) 11/30/2019 0545   PO2ART 57 (L) 11/30/2019 0545   HCO3 27.0 11/30/2019 0545   TCO2 29 11/30/2019 0545   ACIDBASEDEF 1.0 11/29/2019 2214   O2SAT 86.0 11/30/2019 0545     Coagulation Profile: Recent Labs  Lab 11/25/19 1224 11/29/19 1351  INR 1.0 1.5*    Cardiac Enzymes: No results for input(s): CKTOTAL, CKMB, CKMBINDEX, TROPONINI in the last 168 hours.  HbA1C: Hgb A1c MFr Bld  Date/Time Value Ref Range Status  11/29/2019  07:25 AM 6.1 (H) 4.8 - 5.6 % Final    Comment:    (NOTE) Pre diabetes:          5.7%-6.4%  Diabetes:              >6.4%  Glycemic control for   <7.0% adults with diabetes   09/25/2019 03:02 AM 5.8 (H) 4.8 - 5.6 % Final    Comment:    (NOTE) Pre diabetes:          5.7%-6.4%  Diabetes:              >6.4%  Glycemic control for   <7.0% adults with diabetes     CBG: Recent Labs  Lab 11/30/19 0557 11/30/19 0755 11/30/19 0916 11/30/19 1022 11/30/19 1110  GLUCAP 120* 129* 151* 129* 116*    Assessment & Plan:   Critically ill due to acute respiratory failure with hypoxia following cardiac surgery. Failure to wean appears due to suboptimal pain control. Coronary artery disease status post aortocoronary bypass Cerebrovascular disease with recent cerebellar infarction  Plan:  -Optimize pain control: Administer oxycodone via tube, continue round-the-clock Tylenol, small dose of morphine IV.  Given minimal drainage will put chest tubes to gravity  On reassessment: Chest pain better controlled.  Switched to SBT PSV 10/5 with adequate tidal volumes SBI 40. Tolerated SBT for 60 minutes prior to extubation.  Post extubation no respiratory distress.  Patient comfortable.  Recommend continuing with postoperative care including prompt  mobilization, incentive spirometry.   Daily Goals Checklist  Pain/Anxiety/Delirium protocol (if indicated): Oxycodone with morphine as needed, continue round-the-clock Tylenol VAP protocol (if indicated): Mobilization, incentive spirometry. Respiratory support goals: Wean FiO2 on nasal cannula. Blood pressure target: Allow autoregulation.  Discontinue milrinone as per cardiac surgery DVT prophylaxis: SCDs start Lovenox per cardiac surgery protocol Nutritional status and feeding goals: Advance diet per protocol GI prophylaxis: Not required Fluid status goals: Initiate diuresis if remains hypoxic tomorrow Urinary catheter: Assessment of intravascular volume Central lines: Consider removing central line once off milrinone Glucose control: Continue glucose control with IV insulin as per protocol Mobility/therapy needs: Phase 1 cardiac rehabilitation Antibiotic de-escalation: Perioperative antibiotic Home medication reconciliation: Resume secondary prevention medication Daily labs: CBC BMP Code Status: Full code Family Communication: Per primary team Disposition: ICU  CRITICAL CARE Performed by: Kipp Brood   Total critical care time: 45 minutes  Critical care time was exclusive of separately billable procedures and treating other patients.  Critical care was necessary to treat or prevent imminent or life-threatening deterioration.  Critical care was time spent personally by me on the following activities: development of treatment plan with patient and/or surrogate as well as nursing, discussions with consultants, evaluation of patient's response to treatment, examination of patient, obtaining history from patient or surrogate, ordering and performing treatments and interventions, ordering and review of laboratory studies, ordering and review of radiographic studies, pulse oximetry, re-evaluation of patient's condition and participation in multidisciplinary rounds.  Kipp Brood, MD  [facility] ICU Physician [place]  Pager: [phone] Mobile: [phone] After hours: [phone].    11/30/2019, 1:53 PM

## 2019-11-30 NOTE — Discharge Summary (Signed)
Physician Discharge Summary  Patient ID: Jamie Hodge MRN: [id number] DOB/AGE: [birth year] 79 y.o.  Admit date: 11/29/2019 Discharge date: 12/07/2019  Admission Diagnoses:  Discharge Diagnoses:  Active Problems:   S/P CABG x 6   Atrial fibrillation [facility])  Patient Active Problem List   Diagnosis Date Noted  . Atrial fibrillation ([place]) 12/07/2019  . S/P CABG x 6 11/29/2019  . Chest pain 11/24/2019  . Hyperlipidemia 11/24/2019  . Hypertension 11/24/2019  . CAD (coronary artery disease) 11/23/2019  . Stroke ([place]) 09/25/2019  . TIA (transient ischemic attack) 09/23/2019  . 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 CELLULITIS, ABDOMEN 09/03/2010  . HERPES ZOSTER 08/13/2010   History of Present Illness:     79 yo lady experienced right UE weakness and slurred speech in Sept 2021. 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 This was followed with CT coronary suggesting severe CAD, confirmed on LHC this past week. She was admitted  with plan for surgery but was medically stabilized and sent home. Now represents for CABG surgery. 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   Discharged Condition: good  Hospital Course: The patient was admitted and on 11/29/2019 taken the operating room where she underwent the below described procedure.  She tolerated it well was taken to the surgical ICU in critical condition.  Postoperative hospital course  Patient was stabilized in the SICU and CCM consultation was obtained to assist with management.  She failed initial weaning on 2 occasions.  This was initially felt to be related to suboptimal pain control.  As this improved her pulmonary status was felt to be adequate to attempt a spontaneous breathing trial and she was subsequently extubated after approximately 60 minutes of tolerating this.  Post extubation she had no respiratory distress.  She has also had postoperative atrial fibrillation and was placed on  amiodarone infusion with subsequent conversion to sinus rhythm.  She does have a postoperative volume overload and is being treated aggressively with diuretics.  She is noted to have a stable acute blood loss anemia.  Most recent hemoglobin hematocrit dated 12/04/2019 are 10.7/32.6 respectively.  Blood sugars have been under good control.  She had a postoperative leukocytosis which is shown an improving trend over time and she has not febrile.  Renal function was noted to be within normal limits.  She has required some potassium supplementation for hypokalemia.  Oxygen has been weaned and she maintains good saturations on room air.  Incisions are noted to be healing well without evidence of infection.  She is tolerating diet.  She is tolerating routine cardiac rehab.  At the time of discharge the patient is felt to be quite stable.  Consults: cardiology  Significant Diagnostic Studies: routine post op labs/CXR's  Treatments: surgery:  CARDIOTHORACIC SURGERY OPERATIVE NOTE  Date of Procedure:    11/29/2019  Preoperative Diagnosis:      Severe 3-vessel Coronary Artery Disease  Postoperative Diagnosis:    Same  Procedure:        Coronary Artery Bypass Grafting x 6             Left Internal Mammary Artery to Distal Left Anterior Descending Coronary Artery; Saphenous Vein Graft to right Posterior Descending Coronary Artery and Right PLA as sequenced graft; left radial artery to left PLA and ramus intermedius artery as sequenced graft; Sapheonous Vein Graft to  Diagonal Branch Coronary Artery; Endoscopic Vein Harvest from right Thigh and Lower Leg Open left radial artery harvesting Completion graft surveillance  with indocyanine green fluorescence imaging  Surgeon:        B. Murvin Natal, MD  Assistant:       Evonnie Pat, PA-C  Anesthesia:    get  Operative Findings: ? preserved left ventricular systolic function ? good quality left internal mammary artery conduit ? good quality saphenous  vein conduit and left radial artery conduit ? good quality target vessels for grafting   Discharge Exam: Blood pressure (!) 122/58, pulse 80, temperature 98.1 F (36.7 C), temperature source Oral, resp. rate 18, height 5\' 4"  (1.626 m), weight 85.9 kg, SpO2 98 %.  General appearance: alert, cooperative and no distress Heart: regular rate and rhythm Lungs: clear to auscultation bilaterally Abdomen: benign Extremities: min edema Wound: incis healing well  Disposition:  Discharge disposition: 01-Home or Self Care       Discharge Instructions    Amb Referral to Cardiac Rehabilitation   Complete by: As directed    Will send Cardiac Rehab Phase 2 referral to [place] [facility]   Diagnosis: CABG   CABG X ___: 6   After initial evaluation and assessments completed: Virtual Based Care may be provided alone or in conjunction with Phase 2 Cardiac Rehab based on patient barriers.: Yes   Discharge patient   Complete by: As directed    Discharge disposition: 01-Home or Self Care   Discharge patient date: 12/06/2019     Allergies as of 12/06/2019      Reactions   Mango Flavor Anaphylaxis   Contrast Media [iodinated Diagnostic Agents] Other (See Comments)   Unknown reaction      Medication List    STOP taking these medications   carvedilol 6.25 MG tablet Commonly known as: COREG   isosorbide mononitrate 30 MG 24 hr tablet Commonly known as: IMDUR     TAKE these medications   amiodarone 200 MG tablet Commonly known as: PACERONE Take 1 tablet (200 mg total) by mouth 2 (two) times daily.   aspirin EC 81 MG tablet Take 81 mg by mouth daily. Swallow whole.   atorvastatin 80 MG tablet Commonly known as: LIPITOR Take 1 tablet (80 mg total) by mouth daily.   clopidogrel 75 MG tablet Commonly known as: PLAVIX Take 1 tablet (75 mg total) by mouth daily.   isosorbide dinitrate 10 MG tablet Commonly known as: ISORDIL Take 1 tablet (10 mg total) by mouth 3 (three) times daily.    metoprolol tartrate 25 MG tablet Commonly known as: LOPRESSOR Take 1 tablet (25 mg total) by mouth 2 (two) times daily.   MULTIVITAMIN ADULT PO Take 1 tablet by mouth daily.   Nasacort Allergy 24HR 55 MCG/ACT Aero nasal inhaler Generic drug: triamcinolone Place 1 spray into the nose daily as needed (allergies).   PreserVision AREDS 2 Caps Take 2 capsules by mouth daily.   sertraline 25 MG tablet Commonly known as: ZOLOFT Take 1 tablet (25 mg total) by mouth daily. What changed:   when to take this  reasons to take this   traMADol 50 MG tablet Commonly known as: ULTRAM Take 1 tablet (50 mg total) by mouth every 6 (six) hours as needed for up to 7 days for moderate pain.       Follow-up Information    Elouise Munroe, MD Follow up.   Specialties: Cardiology, Radiology Why: Please see discharge paperwork for follow-up appointment with cardiology. Contact information: [address] [place] [address] [phone]        Wonda Olds, MD  Follow up.   Specialty: Cardiothoracic Surgery Why: Please see discharge paperwork for follow-up appointment with heart surgeon. Contact information: [address] [place] [address] [phone]             The patient has been discharged on:   1.Beta Blocker:  Yes Blue.Reese   ]                              No   [   ]                              If No, reason:  2.Ace Inhibitor/ARB: Yes [   ]                                     No  [ n   ]                                     If No, reason:BP well controlled, low range at times  3.Statin:   Yes [  y ]                  No  [   ]                  If No, reason:  4.Ecasa:  Yes  [ y  ]                  No   [   ]                  If No, reason:   Signed: John Giovanni PA-C 12/07/2019, 9:30 AM

## 2019-11-30 NOTE — Progress Notes (Signed)
Failed Rapid Wean Protocol:  PO2 57  SPO2 86% VC 3.2L    Placed back on full support.

## 2019-11-30 NOTE — Progress Notes (Signed)
Patient ID: Jamie Hodge, female   DOB: Jun 12, 1940, 79 y.o.   MRN: [id number] TCTS Evening Rounds:  Hemodynamically stable off vasopressors. Still on milrinone 0.25.  AV paced 90 on IV amio.  Urine output on low side today.  CT output low.  BMET    Component Value Date/Time   NA 136 11/30/2019 1604   NA 138 11/15/2019 1521   K 3.6 11/30/2019 1604   CL 103 11/30/2019 1604   CO2 25 11/30/2019 1604   GLUCOSE 129 (H) 11/30/2019 1604   BUN 14 11/30/2019 1604   BUN 16 11/15/2019 1521   CREATININE 0.68 11/30/2019 1604   CALCIUM 7.9 (L) 11/30/2019 1604   GFRNONAA >60 11/30/2019 1604   GFRAA 97 11/15/2019 1521   CBC    Component Value Date/Time   WBC 15.5 (H) 11/30/2019 1604   RBC 3.69 (L) 11/30/2019 1604   HGB 11.2 (L) 11/30/2019 1604   HGB 14.4 11/15/2019 1521   HCT 34.3 (L) 11/30/2019 1604   HCT 43.7 11/15/2019 1521   PLT 143 (L) 11/30/2019 1604   PLT 270 11/15/2019 1521   MCV 93.0 11/30/2019 1604   MCV 87 11/15/2019 1521   MCH 30.4 11/30/2019 1604   MCHC 32.7 11/30/2019 1604   RDW 14.8 11/30/2019 1604   RDW 13.1 11/15/2019 1521   LYMPHSABS 2.4 09/25/2019 0302   MONOABS 0.8 09/25/2019 0302   EOSABS 0.3 09/25/2019 0302   BASOSABS 0.0 09/25/2019 0302

## 2019-12-01 ENCOUNTER — Ambulatory Visit: Payer: Medicare Other | Admitting: Internal Medicine

## 2019-12-01 ENCOUNTER — Inpatient Hospital Stay (HOSPITAL_COMMUNITY): Payer: Medicare Other

## 2019-12-01 LAB — CBC
HCT: 33 % — ABNORMAL LOW (ref 36.0–46.0)
Hemoglobin: 10.8 g/dL — ABNORMAL LOW (ref 12.0–15.0)
MCH: 30.6 pg (ref 26.0–34.0)
MCHC: 32.7 g/dL (ref 30.0–36.0)
MCV: 93.5 fL (ref 80.0–100.0)
Platelets: 169 10*3/uL (ref 150–400)
RBC: 3.53 MIL/uL — ABNORMAL LOW (ref 3.87–5.11)
RDW: 15.2 % (ref 11.5–15.5)
WBC: 16.5 10*3/uL — ABNORMAL HIGH (ref 4.0–10.5)
nRBC: 0 % (ref 0.0–0.2)

## 2019-12-01 LAB — COOXEMETRY PANEL
Carboxyhemoglobin: 0.9 % (ref 0.5–1.5)
Carboxyhemoglobin: 0.9 % (ref 0.5–1.5)
Methemoglobin: 1 % (ref 0.0–1.5)
Methemoglobin: 0.7 % (ref 0.0–1.5)
O2 Saturation: 70 %
O2 Saturation: 62.3 %
Total hemoglobin: 11.1 g/dL — ABNORMAL LOW (ref 12.0–16.0)
Total hemoglobin: 10.9 g/dL — ABNORMAL LOW (ref 12.0–16.0)

## 2019-12-01 LAB — GLUCOSE, CAPILLARY
Glucose-Capillary: 122 mg/dL — ABNORMAL HIGH (ref 70–99)
Glucose-Capillary: 139 mg/dL — ABNORMAL HIGH (ref 70–99)
Glucose-Capillary: 140 mg/dL — ABNORMAL HIGH (ref 70–99)
Glucose-Capillary: 136 mg/dL — ABNORMAL HIGH (ref 70–99)
Glucose-Capillary: 117 mg/dL — ABNORMAL HIGH (ref 70–99)

## 2019-12-01 LAB — BASIC METABOLIC PANEL
Anion gap: 9 (ref 5–15)
BUN: 14 mg/dL (ref 8–23)
CO2: 25 mmol/L (ref 22–32)
Calcium: 8 mg/dL — ABNORMAL LOW (ref 8.9–10.3)
Chloride: 101 mmol/L (ref 98–111)
Creatinine, Ser: 0.78 mg/dL (ref 0.44–1.00)
GFR, Estimated: >60 mL/min (ref >60)
Glucose, Bld: 151 mg/dL — ABNORMAL HIGH (ref 70–99)
Potassium: 3.9 mmol/L (ref 3.5–5.1)
Sodium: 135 mmol/L (ref 135–145)

## 2019-12-01 LAB — ECHO INTRAOPERATIVE TEE

## 2019-12-01 MED ORDER — PROMETHAZINE HCL 25 MG/ML IJ SOLN
12.5000 mg | Freq: Four times a day (QID) | INTRAMUSCULAR | Status: DC | PRN
  Administered 2019-12-01: 12.5 mg via INTRAVENOUS
  Filled 2019-12-01 (×2): qty 1

## 2019-12-01 MED ORDER — ORAL CARE MOUTH RINSE
15.0000 mL | Freq: Two times a day (BID) | OROMUCOSAL | Status: DC
  Administered 2019-12-01 – 2019-12-05 (×8): 15 mL via OROMUCOSAL

## 2019-12-01 MED ORDER — METOCLOPRAMIDE HCL 5 MG/ML IJ SOLN
10.0000 mg | Freq: Three times a day (TID) | INTRAMUSCULAR | Status: AC
  Administered 2019-12-01 – 2019-12-02 (×5): 10 mg via INTRAVENOUS
  Filled 2019-12-01 (×5): qty 2

## 2019-12-01 MED ORDER — MILRINONE LACTATE IN DEXTROSE 20-5 MG/100ML-% IV SOLN
0.1250 ug/kg/min | INTRAVENOUS | Status: DC
  Administered 2019-12-01: 0.125 ug/kg/min via INTRAVENOUS
  Filled 2019-12-01: qty 100

## 2019-12-01 MED ORDER — FUROSEMIDE 10 MG/ML IJ SOLN
40.0000 mg | Freq: Two times a day (BID) | INTRAMUSCULAR | Status: DC
  Administered 2019-12-01 – 2019-12-04 (×7): 40 mg via INTRAVENOUS
  Filled 2019-12-01 (×7): qty 4

## 2019-12-01 MED ORDER — LEVALBUTEROL HCL 0.63 MG/3ML IN NEBU
0.6300 mg | INHALATION_SOLUTION | Freq: Two times a day (BID) | RESPIRATORY_TRACT | Status: DC
  Administered 2019-12-01 – 2019-12-02 (×3): 0.63 mg via RESPIRATORY_TRACT
  Filled 2019-12-01 (×3): qty 3

## 2019-12-01 NOTE — Progress Notes (Signed)
Patient ID: Jamie Hodge, female   DOB: Jun 16, 1940, 79 y.o.   MRN: [id number] EVENING ROUNDS NOTE :     [address]       [place] [address]             [phone]                 2 Days Post-Op Procedure(s) (LRB): CORONARY ARTERY BYPASS GRAFTING (CABG) x 6 ON PUMP. USING LEFT INTERNAL MAMMARY ARTERY, LEFT RADIAL ARTERY AND RIGHT GREATER SAPHENOUS VEIN. (N/A) RADIAL ARTERY HARVEST (Left) TRANSESOPHAGEAL ECHOCARDIOGRAM (TEE) (N/A) INDOCYANINE GREEN FLUORESCENCE IMAGING (ICG) (N/A) ENDOVEIN HARVEST OF GREATER SAPHENOUS VEIN (Right)  Total Length of Stay:  LOS: 2 days  BP 121/72   Pulse 77   Temp 98.2 F (36.8 C) (Oral)   Resp 18   Ht 5\' 4"  (1.626 m)   Wt 92.3 kg   SpO2 (!) 88%   BMI 34.93 kg/m   .Intake/Output      11/09 0701 - 11/10 0700 11/10 0701 - 11/11 0700   I.V. (mL/kg) 614.9 (6.7) 881.9 (9.6)   Blood     IV Piggyback 150    Total Intake(mL/kg) 764.9 (8.3) 881.9 (9.6)   Urine (mL/kg/hr) 1310 (0.6) 885 (1)   Other     Blood     Chest Tube 750 90   Total Output 2060 975   Net -1295.1 -93.1          . sodium chloride Stopped (11/29/19 1948)  . sodium chloride 250 mL (11/30/19 0558)  . sodium chloride 10 mL/hr at 11/30/19 1559  . amiodarone 30 mg/hr (12/01/19 1500)  . lactated ringers    . lactated ringers Stopped (11/29/19 1330)  . lactated ringers 20 mL/hr at 12/01/19 1500  . milrinone 0.125 mcg/kg/min (12/01/19 1500)     Lab Results  Component Value Date   WBC 16.5 (H) 12/01/2019   HGB 10.8 (L) 12/01/2019   HCT 33.0 (L) 12/01/2019   PLT 169 12/01/2019   GLUCOSE 151 (H) 12/01/2019   CHOL 62 11/30/2019   TRIG 68 11/30/2019   HDL 20 (L) 11/30/2019   LDLCALC 28 11/30/2019   ALT 32 11/29/2019   AST 18 11/29/2019   NA 135 12/01/2019   K 3.9 12/01/2019   CL 101 12/01/2019   CREATININE 0.78 12/01/2019   BUN 14 12/01/2019   CO2 25 12/01/2019   INR 1.5 (H) 11/29/2019   HGBA1C 6.1 (H) 11/29/2019   Major complaint today is  nausea, no abdominal pain or tenderness Start Reglan  short course   Grace Isaac MD  Beeper [phone] Office [phone] 12/01/2019 4:24 PM

## 2019-12-01 NOTE — Progress Notes (Signed)
NAME:  Jamie Hodge, MRN:  [id number], DOB:  [birth year], LOS: 2 ADMISSION DATE:  11/29/2019, CONSULTATION DATE: 11/30/2019 REFERRING MD: Rosaria Ferries, CHIEF COMPLAINT: Failure to extubate  HPI/course in hospital  79 year old woman who underwent uneventful aorto coronary bypass x6 yesterday.  Failed attempted wean twice.  Elective bypass for stable coronary disease found relatively incidentally as part of work-up following recent stroke.  Found to have extensive coronary disease on coronary CT angio which was confirmed by coronary angiography.  LVEDP normal at the time of catheterization. Preoperative PFTs showed mild restrictive lung disease.  Intraoperative echocardiogram showed normal LV function with minimal mitral regurgitation at the end of surgery.  No difficulty weaning from cardiopulmonary bypass.   Past Medical History   Past Medical History:  Diagnosis Date  . Coronary artery disease   . CVA (cerebral vascular accident) ([place])   . Herpes zoster 2012  . Vertigo      Past Surgical History:  Procedure Laterality Date  . CARDIAC CATHETERIZATION    . CORONARY ARTERY BYPASS GRAFT N/A 11/29/2019   Procedure: CORONARY ARTERY BYPASS GRAFTING (CABG) x 6 ON PUMP. USING LEFT INTERNAL MAMMARY ARTERY, LEFT RADIAL ARTERY AND RIGHT GREATER SAPHENOUS VEIN.;  Surgeon: Wonda Olds, MD;  Location: [facility];  Service: Open Heart Surgery;  Laterality: N/A;  . ENDOVEIN HARVEST OF GREATER SAPHENOUS VEIN Right 11/29/2019   Procedure: ENDOVEIN HARVEST OF GREATER SAPHENOUS VEIN;  Surgeon: Wonda Olds, MD;  Location: [facility];  Service: Open Heart Surgery;  Laterality: Right;  . LEFT HEART CATH AND CORONARY ANGIOGRAPHY N/A 11/23/2019   Procedure: LEFT HEART CATH AND CORONARY ANGIOGRAPHY;  Surgeon: Jettie Booze, MD;  Location: [facility];  Service: Cardiovascular;  Laterality: N/A;  . RADIAL ARTERY HARVEST Left 11/29/2019   Procedure: RADIAL ARTERY HARVEST;  Surgeon: Wonda Olds, MD;  Location: [facility];  Service: Open Heart Surgery;  Laterality: Left;  . RHINOPLASTY    . TEE WITHOUT CARDIOVERSION N/A 11/29/2019   Procedure: TRANSESOPHAGEAL ECHOCARDIOGRAM (TEE);  Surgeon: Wonda Olds, MD;  Location: [facility];  Service: Open Heart Surgery;  Laterality: N/A;     Review of Systems:   Review of Systems  Unable to perform ROS: Critical illness    Social History   reports that she has never smoked. She has never used smokeless tobacco. She reports that she does not drink alcohol and does not use drugs.   Family History   Her family history includes CAD (age of onset: 73) in her father; Lung cancer (age of onset: 66) in her father; Unexplained death in her mother.   Allergies Allergies  Allergen Reactions  . Mango Flavor Anaphylaxis  . Contrast Media [Iodinated Diagnostic Agents] Other (See Comments)    Unknown reaction     Home Medications  Prior to Admission medications   Medication Sig Start Date End Date Taking? Authorizing Provider  aspirin EC 81 MG tablet Take 81 mg by mouth daily. Swallow whole.   Yes [provider]  atorvastatin (LIPITOR) 80 MG tablet Take 1 tablet (80 mg total) by mouth daily. 10/28/19  Yes McCue, Janett Billow, NP  carvedilol (COREG) 6.25 MG tablet Take 1 tablet (6.25 mg total) by mouth 2 (two) times daily. 11/25/19  Yes Reino Bellis B, NP  isosorbide mononitrate (IMDUR) 30 MG 24 hr tablet Take 1 tablet (30 mg total) by mouth daily. 11/25/19  Yes Jettie Booze, MD  Multiple Vitamin (MULTIVITAMIN ADULT PO) Take 1 tablet by  mouth daily.   Yes [provider]  Multiple Vitamins-Minerals (PRESERVISION AREDS 2) CAPS Take 2 capsules by mouth daily.   Yes [provider]  sertraline (ZOLOFT) 25 MG tablet Take 1 tablet (25 mg total) by mouth daily. Patient taking differently: Take 25 mg by mouth daily as needed (anxiety).  10/28/19  Yes McCue, Janett Billow, NP  triamcinolone (NASACORT ALLERGY 24HR) 55 MCG/ACT  AERO nasal inhaler Place 1 spray into the nose daily as needed (allergies).   Yes [provider]     Interim history/subjective:  This morning on evaluation the patient is awake and endorses pain on deep inspiration.  Objective   Blood pressure 109/64, pulse 70, temperature 98.2 F (36.8 C), temperature source Oral, resp. rate 17, height 5\' 4"  (1.626 m), weight 92.3 kg, SpO2 92 %.    FiO2 (%):  [36 %] 36 %   Intake/Output Summary (Last 24 hours) at 12/01/2019 1343 Last data filed at 12/01/2019 1200 Gross per 24 hour  Intake 1122.63 ml  Output 2750 ml  Net -1627.37 ml   Filed Weights   11/30/19 0500 12/01/19 0700  Weight: 92 kg 92.3 kg    Examination:  Physical Exam Constitutional:      General: She is not in acute distress.    Appearance: She is obese. She is not toxic-appearing.  HENT:     Head:     Comments: extubated.  Cardiovascular:     Rate and Rhythm: Atrially paced rhythm, underlying rhythm is sinus bradycardia.     Heart sounds: Normal heart sounds. No friction rub.  Pulmonary:     Effort: Pulmonary effort is normal. Improved chest excursion.     Breath sounds: Normal breath sounds.     Comments: Limited chest excursion.  Pain with deep inspiration. Chest:     Chest wall: Tenderness present.     Comments: Midline sternotomy incision.  Minimal chest tube drainage. Abdominal:     Palpations: Abdomen is soft.  Neurological:     Mental Status: She is alert.     GCS: GCS eye subscore is 4. GCS verbal subscore is 5. GCS motor subscore is 6.     Comments: No focal deficits     Ancillary tests (personally reviewed)  CBC: Recent Labs  Lab 11/29/19 1351 11/29/19 1354 11/29/19 1920 11/29/19 1920 11/29/19 2214 11/30/19 0459 11/30/19 0545 11/30/19 1604 12/01/19 0346  WBC 20.4*  --  12.9*  --   --  11.6*  --  15.5* 16.5*  HGB 9.7*   < > 11.0*   < > 10.9* 10.8* 10.9* 11.2* 10.8*  HCT 30.1*   < > 33.4*   < > 32.0* 33.1* 32.0* 34.3* 33.0*  MCV 92.0   --  90.5  --   --  90.2  --  93.0 93.5  PLT 137*  --  115*  --   --  123*  --  143* 169   < > = values in this interval not displayed.    Basic Metabolic Panel: Recent Labs  Lab 11/29/19 0725 11/29/19 0924 11/29/19 1238 11/29/19 1354 11/29/19 1920 11/29/19 1920 11/29/19 2214 11/30/19 0459 11/30/19 0545 11/30/19 1604 12/01/19 0346  NA 137   < > 140   < > 138   < > 141 138 142 136 135  K 3.8   < > 3.5   < > 4.3   < > 4.0 3.8 4.0 3.6 3.9  CL 102   < > 100  --  106  --   --  107  --  103 101  CO2 26  --   --   --  25  --   --  25  --  25 25  GLUCOSE 123*   < > 150*  --  145*  --   --  131*  --  129* 151*  BUN 13   < > 10  --  10  --   --  11  --  14 14  CREATININE 0.69   < > 0.50  --  0.58  --   --  0.62  --  0.68 0.78  CALCIUM 9.1  --   --   --  7.5*  --   --  7.7*  --  7.9* 8.0*  MG  --   --   --   --   --   --   --  2.5*  --  2.7*  --    < > = values in this interval not displayed.   GFR: Estimated Creatinine Clearance: 64.8 mL/min (by C-G formula based on SCr of 0.78 mg/dL). Recent Labs  Lab 11/29/19 1920 11/30/19 0459 11/30/19 1604 12/01/19 0346  WBC 12.9* 11.6* 15.5* 16.5*    Liver Function Tests: Recent Labs  Lab 11/29/19 0725  AST 18  ALT 32  ALKPHOS 98  BILITOT 0.6  PROT 6.4*  ALBUMIN 3.4*   No results for input(s): LIPASE, AMYLASE in the last 168 hours. No results for input(s): AMMONIA in the last 168 hours.  ABG    Component Value Date/Time   PHART 7.315 (L) 11/30/2019 0545   PCO2ART 53.1 (H) 11/30/2019 0545   PO2ART 57 (L) 11/30/2019 0545   HCO3 27.0 11/30/2019 0545   TCO2 29 11/30/2019 0545   ACIDBASEDEF 1.0 11/29/2019 2214   O2SAT 70.0 12/01/2019 0350     Coagulation Profile: Recent Labs  Lab 11/25/19 1224 11/29/19 1351  INR 1.0 1.5*    Cardiac Enzymes: No results for input(s): CKTOTAL, CKMB, CKMBINDEX, TROPONINI in the last 168 hours.  HbA1C: Hgb A1c MFr Bld  Date/Time Value Ref Range Status  11/29/2019 07:25 AM 6.1 (H) 4.8  - 5.6 % Final    Comment:    (NOTE) Pre diabetes:          5.7%-6.4%  Diabetes:              >6.4%  Glycemic control for   <7.0% adults with diabetes   09/25/2019 03:02 AM 5.8 (H) 4.8 - 5.6 % Final    Comment:    (NOTE) Pre diabetes:          5.7%-6.4%  Diabetes:              >6.4%  Glycemic control for   <7.0% adults with diabetes     CBG: Recent Labs  Lab 11/30/19 1549 11/30/19 1957 11/30/19 2352 12/01/19 0458 12/01/19 1116  GLUCAP 130* 120* 143* 122* 140*    Assessment & Plan:   Was critically ill due to acute respiratory failure with hypoxia following cardiac surgery. Post operative atrial fibrillation requiring amiodarone infusion.  Failure to wean appears due to suboptimal pain control. Coronary artery disease status post aortocoronary bypass Cerebrovascular disease with recent cerebellar infarction.  Plan:  -Continue to mobilize and diurese again today. -Wean milrinone to off.  -Drop pacing rate, allow for spontaneous conduction.   Daily Goals Checklist  Pain/Anxiety/Delirium protocol (if indicated): Oxycodone with morphine as needed, continue round-the-clock Tylenol VAP protocol (if indicated): Mobilization, incentive spirometry. Respiratory  support goals: Wean FiO2 on nasal cannula. Blood pressure target: Allow autoregulation.  Discontinue milrinone as per cardiac surgery DVT prophylaxis: SCDs start Lovenox per cardiac surgery protocol Nutritional status and feeding goals: Advance diet per protocol GI prophylaxis: Not required Fluid status goals: Initiate diuresis if remains hypoxic tomorrow Urinary catheter: Assessment of intravascular volume Central lines: Consider removing central line once off milrinone Glucose control: Continue glucose control with IV insulin as per protocol Mobility/therapy needs: Phase 1 cardiac rehabilitation Antibiotic de-escalation: Perioperative antibiotic Home medication reconciliation: Resume secondary prevention  medication Daily labs: CBC BMP Code Status: Full code Family Communication: Per primary team Disposition: ICU  Kipp Brood, MD [facility] ICU Physician [place]  Pager: [phone] Mobile: [phone] After hours: [phone].    12/01/2019, 1:43 PM

## 2019-12-01 NOTE — Progress Notes (Signed)
Patient has been nauseated throughout shift and refusing mobility due to severe nausea with any movement. CCM rounded this PM and ordered 12.5mg  of Promethazine. 30 minutes of after dose given patient began to be increasingly drowsy. CCM made aware. Orders received to closely monitor and discontinue additional Promethazine and Zofran and use schedule Reglan for nausea control.

## 2019-12-01 NOTE — Progress Notes (Signed)
2 Days Post-Op Procedure(s) (LRB): CORONARY ARTERY BYPASS GRAFTING (CABG) x 6 ON PUMP. USING LEFT INTERNAL MAMMARY ARTERY, LEFT RADIAL ARTERY AND RIGHT GREATER SAPHENOUS VEIN. (N/A) RADIAL ARTERY HARVEST (Left) TRANSESOPHAGEAL ECHOCARDIOGRAM (TEE) (N/A) INDOCYANINE GREEN FLUORESCENCE IMAGING (ICG) (N/A) ENDOVEIN HARVEST OF GREATER SAPHENOUS VEIN (Right) Subjective: Feeling better  Objective: Vital signs in last 24 hours: Temp:  [97.9 F (36.6 C)-98.6 F (37 C)] 98.2 F (36.8 C) (11/10 0640) Pulse Rate:  [85-93] 90 (11/10 0500) Cardiac Rhythm: Atrial paced (11/09 2000) Resp:  [0-23] 16 (11/10 0500) BP: (90-117)/(57-72) 101/60 (11/10 0500) SpO2:  [93 %-97 %] 95 % (11/10 0500) Arterial Line BP: (114-132)/(52-64) 114/52 (11/09 1200) FiO2 (%):  [36 %] 36 % (11/09 1407) Weight:  [92.3 kg] 92.3 kg (11/10 0700)  Hemodynamic parameters for last 24 hours: PAP: (22-31)/(12-21) 27/15 CO:  [3.7 L/min-4.1 L/min] 3.7 L/min CI:  [2 L/min/m2-2.2 L/min/m2] 2 L/min/m2  Intake/Output from previous day: 11/09 0701 - 11/10 0700 In: 764.9 [I.V.:614.9; IV Piggyback:150] Out: 2060 [Urine:1310; Chest Tube:750] Intake/Output this shift: No intake/output data recorded.  General appearance: alert and cooperative Neurologic: intact Heart: regular rate and rhythm, S1, S2 normal, no murmur, click, rub or gallop Lungs: clear to auscultation bilaterally Extremities: extremities normal, atraumatic, no cyanosis or edema Wound: dressed  Lab Results: Recent Labs    11/30/19 1604 12/01/19 0346  WBC 15.5* 16.5*  HGB 11.2* 10.8*  HCT 34.3* 33.0*  PLT 143* 169   BMET:  Recent Labs    11/30/19 1604 12/01/19 0346  NA 136 135  K 3.6 3.9  CL 103 101  CO2 25 25  GLUCOSE 129* 151*  BUN 14 14  CREATININE 0.68 0.78  CALCIUM 7.9* 8.0*    PT/INR:  Recent Labs    11/29/19 1351  LABPROT 17.5*  INR 1.5*   ABG    Component Value Date/Time   PHART 7.315 (L) 11/30/2019 0545   HCO3 27.0 11/30/2019  0545   TCO2 29 11/30/2019 0545   ACIDBASEDEF 1.0 11/29/2019 2214   O2SAT 70.0 12/01/2019 0350   CBG (last 3)  Recent Labs    11/30/19 1957 11/30/19 2352 12/01/19 0458  GLUCAP 120* 143* 122*    Assessment/Plan: S/P Procedure(s) (LRB): CORONARY ARTERY BYPASS GRAFTING (CABG) x 6 ON PUMP. USING LEFT INTERNAL MAMMARY ARTERY, LEFT RADIAL ARTERY AND RIGHT GREATER SAPHENOUS VEIN. (N/A) RADIAL ARTERY HARVEST (Left) TRANSESOPHAGEAL ECHOCARDIOGRAM (TEE) (N/A) INDOCYANINE GREEN FLUORESCENCE IMAGING (ICG) (N/A) ENDOVEIN HARVEST OF GREATER SAPHENOUS VEIN (Right) Mobilize Diuresis remove chest tubes  Continue amiodarone   LOS: 2 days    Wonda Olds 12/01/2019

## 2019-12-01 NOTE — Plan of Care (Signed)
  Problem: Education: Goal: Will demonstrate proper wound care and an understanding of methods to prevent future damage Outcome: Progressing Goal: Knowledge of disease or condition will improve Outcome: Progressing Goal: Knowledge of the prescribed therapeutic regimen will improve Outcome: Progressing Goal: Individualized Educational Video(s) Outcome: Progressing   Problem: Activity: Goal: Risk for activity intolerance will decrease Outcome: Progressing   Problem: Cardiac: Goal: Will achieve and/or maintain hemodynamic stability Outcome: Progressing   Problem: Clinical Measurements: Goal: Postoperative complications will be avoided or minimized Outcome: Progressing   Problem: Respiratory: Goal: Respiratory status will improve Outcome: Progressing

## 2019-12-02 ENCOUNTER — Inpatient Hospital Stay (HOSPITAL_COMMUNITY): Payer: Medicare Other

## 2019-12-02 DIAGNOSIS — Z951 Presence of aortocoronary bypass graft: Secondary | ICD-10-CM | POA: Diagnosis not present

## 2019-12-02 DIAGNOSIS — I2510 Atherosclerotic heart disease of native coronary artery without angina pectoris: Secondary | ICD-10-CM

## 2019-12-02 LAB — ECHOCARDIOGRAM LIMITED
Area-P 1/2: 3.42 cm2
Height: 64 in
Weight: 3259.28 oz

## 2019-12-02 LAB — GLUCOSE, CAPILLARY
Glucose-Capillary: 106 mg/dL — ABNORMAL HIGH (ref 70–99)
Glucose-Capillary: 114 mg/dL — ABNORMAL HIGH (ref 70–99)
Glucose-Capillary: 116 mg/dL — ABNORMAL HIGH (ref 70–99)
Glucose-Capillary: 119 mg/dL — ABNORMAL HIGH (ref 70–99)
Glucose-Capillary: 124 mg/dL — ABNORMAL HIGH (ref 70–99)
Glucose-Capillary: 134 mg/dL — ABNORMAL HIGH (ref 70–99)
Glucose-Capillary: 173 mg/dL — ABNORMAL HIGH (ref 70–99)

## 2019-12-02 LAB — CBC
HCT: 32 % — ABNORMAL LOW (ref 36.0–46.0)
Hemoglobin: 10.5 g/dL — ABNORMAL LOW (ref 12.0–15.0)
MCH: 30.7 pg (ref 26.0–34.0)
MCHC: 32.8 g/dL (ref 30.0–36.0)
MCV: 93.6 fL (ref 80.0–100.0)
Platelets: 192 10*3/uL (ref 150–400)
RBC: 3.42 MIL/uL — ABNORMAL LOW (ref 3.87–5.11)
RDW: 15 % (ref 11.5–15.5)
WBC: 17 10*3/uL — ABNORMAL HIGH (ref 4.0–10.5)
nRBC: 0 % (ref 0.0–0.2)

## 2019-12-02 LAB — COOXEMETRY PANEL
Carboxyhemoglobin: 1 % (ref 0.5–1.5)
Carboxyhemoglobin: 0.8 % (ref 0.5–1.5)
Methemoglobin: 1 % (ref 0.0–1.5)
Methemoglobin: 0.8 % (ref 0.0–1.5)
O2 Saturation: 63.1 %
O2 Saturation: 61.1 %
Total hemoglobin: 10.6 g/dL — ABNORMAL LOW (ref 12.0–16.0)
Total hemoglobin: 10.1 g/dL — ABNORMAL LOW (ref 12.0–16.0)

## 2019-12-02 LAB — BASIC METABOLIC PANEL
Anion gap: 10 (ref 5–15)
BUN: 17 mg/dL (ref 8–23)
CO2: 30 mmol/L (ref 22–32)
Calcium: 8.2 mg/dL — ABNORMAL LOW (ref 8.9–10.3)
Chloride: 96 mmol/L — ABNORMAL LOW (ref 98–111)
Creatinine, Ser: 0.66 mg/dL (ref 0.44–1.00)
GFR, Estimated: >60 mL/min (ref >60)
Glucose, Bld: 122 mg/dL — ABNORMAL HIGH (ref 70–99)
Potassium: 3.3 mmol/L — ABNORMAL LOW (ref 3.5–5.1)
Sodium: 136 mmol/L (ref 135–145)

## 2019-12-02 LAB — POTASSIUM: Potassium: 3.2 mmol/L — ABNORMAL LOW (ref 3.5–5.1)

## 2019-12-02 MED ORDER — LEVALBUTEROL HCL 0.63 MG/3ML IN NEBU
0.6300 mg | INHALATION_SOLUTION | RESPIRATORY_TRACT | Status: DC | PRN
  Administered 2019-12-02: 0.63 mg via RESPIRATORY_TRACT
  Filled 2019-12-02: qty 3

## 2019-12-02 MED ORDER — POTASSIUM CHLORIDE 10 MEQ/50ML IV SOLN
10.0000 meq | INTRAVENOUS | Status: AC
  Administered 2019-12-02 (×3): 10 meq via INTRAVENOUS
  Filled 2019-12-02 (×3): qty 50

## 2019-12-02 MED ORDER — ASPIRIN EC 81 MG PO TBEC
81.0000 mg | DELAYED_RELEASE_TABLET | Freq: Every day | ORAL | Status: DC
  Administered 2019-12-02 – 2019-12-05 (×4): 81 mg via ORAL
  Filled 2019-12-02 (×4): qty 1

## 2019-12-02 MED ORDER — POTASSIUM CHLORIDE CRYS ER 20 MEQ PO TBCR
20.0000 meq | EXTENDED_RELEASE_TABLET | ORAL | Status: AC
  Administered 2019-12-02 – 2019-12-03 (×2): 20 meq via ORAL
  Filled 2019-12-02: qty 1

## 2019-12-02 MED ORDER — CLOPIDOGREL BISULFATE 75 MG PO TABS
75.0000 mg | ORAL_TABLET | Freq: Every day | ORAL | Status: DC
  Administered 2019-12-02 – 2019-12-05 (×4): 75 mg via ORAL
  Filled 2019-12-02 (×4): qty 1

## 2019-12-02 MED ORDER — ONDANSETRON HCL 4 MG/2ML IJ SOLN
4.0000 mg | Freq: Four times a day (QID) | INTRAMUSCULAR | Status: DC | PRN
  Administered 2019-12-02 (×2): 4 mg via INTRAVENOUS
  Filled 2019-12-02 (×2): qty 2

## 2019-12-02 MED ORDER — POTASSIUM CHLORIDE 20 MEQ/15ML (10%) PO SOLN
20.0000 meq | ORAL | Status: DC
  Administered 2019-12-02: 20 meq
  Filled 2019-12-02 (×3): qty 15

## 2019-12-02 MED FILL — Lidocaine HCl Local Soln Prefilled Syringe 100 MG/5ML (2%): INTRAMUSCULAR | Qty: 5 | Status: AC

## 2019-12-02 MED FILL — Mannitol IV Soln 20%: INTRAVENOUS | Qty: 500 | Status: AC

## 2019-12-02 MED FILL — Sodium Bicarbonate IV Soln 8.4%: INTRAVENOUS | Qty: 50 | Status: AC

## 2019-12-02 MED FILL — Sodium Chloride IV Soln 0.9%: INTRAVENOUS | Qty: 2000 | Status: AC

## 2019-12-02 MED FILL — Electrolyte-R (PH 7.4) Solution: INTRAVENOUS | Qty: 5000 | Status: AC

## 2019-12-02 NOTE — Plan of Care (Signed)

## 2019-12-02 NOTE — Progress Notes (Signed)
  Echocardiogram 2D Echocardiogram has been performed.  Johny Chess 12/02/2019, 2:01 PM

## 2019-12-02 NOTE — Progress Notes (Signed)
[facility] Progress Note Patient Name: Jamie Hodge DOB: Apr 06, 1940 MRN: [id number]   Date of Service  12/02/2019  HPI/Events of Note  Patient is nauseated.  eICU Interventions  PRN Zofran ordered.        Amaya Blakeman U Hadlie Gipson 12/02/2019, 1:28 AM

## 2019-12-02 NOTE — Progress Notes (Signed)
3 Days Post-Op Procedure(s) (LRB): CORONARY ARTERY BYPASS GRAFTING (CABG) x 6 ON PUMP. USING LEFT INTERNAL MAMMARY ARTERY, LEFT RADIAL ARTERY AND RIGHT GREATER SAPHENOUS VEIN. (N/A) RADIAL ARTERY HARVEST (Left) TRANSESOPHAGEAL ECHOCARDIOGRAM (TEE) (N/A) INDOCYANINE GREEN FLUORESCENCE IMAGING (ICG) (N/A) ENDOVEIN HARVEST OF GREATER SAPHENOUS VEIN (Right) Subjective: Lousy night Objective: Vital signs in last 24 hours: Temp:  [97.8 F (36.6 C)-98.3 F (36.8 C)] 98.3 F (36.8 C) (11/11 0737) Pulse Rate:  [70-90] 86 (11/11 0800) Cardiac Rhythm: Normal sinus rhythm (11/11 0300) Resp:  [11-31] 27 (11/11 0800) BP: (98-155)/(41-96) 139/71 (11/11 0800) SpO2:  [88 %-97 %] 94 % (11/11 0808) Weight:  [92.4 kg] 92.4 kg (11/11 0500)  Hemodynamic parameters for last 24 hours:    Intake/Output from previous day: 11/10 0701 - 11/11 0700 In: 1514.2 [I.V.:1514.2] Out: 1275 [Urine:1185; Chest Tube:90] Intake/Output this shift: Total I/O In: 39.9 [I.V.:39.9] Out: -   General appearance: alert and no distress Neurologic: intact Heart: regular rate and rhythm, S1, S2 normal, no murmur, click, rub or gallop Lungs: diminished breath sounds bibasilar Abdomen: soft, non-tender; bowel sounds normal; no masses,  no organomegaly Extremities: edema mild Wound: dressed  Lab Results: Recent Labs    12/01/19 0346 12/02/19 0503  WBC 16.5* 17.0*  HGB 10.8* 10.5*  HCT 33.0* 32.0*  PLT 169 192   BMET:  Recent Labs    12/01/19 0346 12/02/19 0503  NA 135 136  K 3.9 3.3*  CL 101 96*  CO2 25 30  GLUCOSE 151* 122*  BUN 14 17  CREATININE 0.78 0.66  CALCIUM 8.0* 8.2*    PT/INR:  Recent Labs    11/29/19 1351  LABPROT 17.5*  INR 1.5*   ABG    Component Value Date/Time   PHART 7.315 (L) 11/30/2019 0545   HCO3 27.0 11/30/2019 0545   TCO2 29 11/30/2019 0545   ACIDBASEDEF 1.0 11/29/2019 2214   O2SAT 63.1 12/02/2019 0503   CBG (last 3)  Recent Labs    12/02/19 0015 12/02/19 0359  12/02/19 0739  GLUCAP 124* 114* 173*    Assessment/Plan: S/P Procedure(s) (LRB): CORONARY ARTERY BYPASS GRAFTING (CABG) x 6 ON PUMP. USING LEFT INTERNAL MAMMARY ARTERY, LEFT RADIAL ARTERY AND RIGHT GREATER SAPHENOUS VEIN. (N/A) RADIAL ARTERY HARVEST (Left) TRANSESOPHAGEAL ECHOCARDIOGRAM (TEE) (N/A) INDOCYANINE GREEN FLUORESCENCE IMAGING (ICG) (N/A) ENDOVEIN HARVEST OF GREATER SAPHENOUS VEIN (Right) Mobilize Diuresis PT/OT/CIR consults  Needs aggressive pulm toilet Stop colchicine due to nausea   LOS: 3 days    Wonda Olds 12/02/2019

## 2019-12-02 NOTE — Progress Notes (Signed)
EVENING ROUNDS NOTE :     [address]       [place] [address]             [phone]                 3 Days Post-Op Procedure(s) (LRB): CORONARY ARTERY BYPASS GRAFTING (CABG) x 6 ON PUMP. USING LEFT INTERNAL MAMMARY ARTERY, LEFT RADIAL ARTERY AND RIGHT GREATER SAPHENOUS VEIN. (N/A) RADIAL ARTERY HARVEST (Left) TRANSESOPHAGEAL ECHOCARDIOGRAM (TEE) (N/A) INDOCYANINE GREEN FLUORESCENCE IMAGING (ICG) (N/A) ENDOVEIN HARVEST OF GREATER SAPHENOUS VEIN (Right)   Total Length of Stay:  LOS: 3 days  Events:   No events Resting comfortably Echo unchanged    BP 119/65   Pulse 82   Temp 97.8 F (36.6 C) (Oral)   Resp 19   Ht 5\' 4"  (1.626 m)   Wt 92.4 kg   SpO2 95%   BMI 34.97 kg/m         . sodium chloride Stopped (11/29/19 1948)  . sodium chloride 250 mL (11/30/19 0558)  . sodium chloride 10 mL/hr at 11/30/19 1559  . amiodarone 30 mg/hr (12/02/19 1700)  . lactated ringers    . lactated ringers Stopped (11/29/19 1330)  . lactated ringers Stopped (12/02/19 1512)    I/O last 3 completed shifts: In: 1514.2 [I.V.:1514.2] Out: 2765 [Urine:2265; Chest Tube:500]   CBC Latest Ref Rng & Units 12/02/2019 12/01/2019 11/30/2019  WBC 4.0 - 10.5 K/uL 17.0(H) 16.5(H) 15.5(H)  Hemoglobin 12.0 - 15.0 g/dL 10.5(L) 10.8(L) 11.2(L)  Hematocrit 36 - 46 % 32.0(L) 33.0(L) 34.3(L)  Platelets 150 - 400 K/uL 192 169 143(L)    BMP Latest Ref Rng & Units 12/02/2019 12/01/2019 11/30/2019  Glucose 70 - 99 mg/dL 122(H) 151(H) 129(H)  BUN 8 - 23 mg/dL 17 14 14   Creatinine 0.44 - 1.00 mg/dL 0.66 0.78 0.68  BUN/Creat Ratio 12 - 28 - - -  Sodium 135 - 145 mmol/L 136 135 136  Potassium 3.5 - 5.1 mmol/L 3.3(L) 3.9 3.6  Chloride 98 - 111 mmol/L 96(L) 101 103  CO2 22 - 32 mmol/L 30 25 25   Calcium 8.9 - 10.3 mg/dL 8.2(L) 8.0(L) 7.9(L)    ABG    Component Value Date/Time   PHART 7.315 (L) 11/30/2019 0545   PCO2ART 53.1 (H) 11/30/2019 0545   PO2ART 57 (L) 11/30/2019 0545    HCO3 27.0 11/30/2019 0545   TCO2 29 11/30/2019 0545   ACIDBASEDEF 1.0 11/29/2019 2214   O2SAT 63.1 12/02/2019 0503       Nylan Nakatani, MD 12/02/2019 5:08 PM

## 2019-12-02 NOTE — Plan of Care (Signed)

## 2019-12-03 ENCOUNTER — Inpatient Hospital Stay (HOSPITAL_COMMUNITY): Payer: Medicare Other

## 2019-12-03 LAB — TYPE AND SCREEN
ABO/RH(D): O POS
Antibody Screen: NEGATIVE
Unit division: 0
Unit division: 0
Unit division: 0
Unit division: 0

## 2019-12-03 LAB — GLUCOSE, CAPILLARY
Glucose-Capillary: 139 mg/dL — ABNORMAL HIGH (ref 70–99)
Glucose-Capillary: 106 mg/dL — ABNORMAL HIGH (ref 70–99)
Glucose-Capillary: 92 mg/dL (ref 70–99)
Glucose-Capillary: 117 mg/dL — ABNORMAL HIGH (ref 70–99)
Glucose-Capillary: 124 mg/dL — ABNORMAL HIGH (ref 70–99)

## 2019-12-03 LAB — BPAM RBC
Blood Product Expiration Date: 202112072359
Blood Product Expiration Date: 202112072359
Blood Product Expiration Date: 202112102359
Blood Product Expiration Date: 202112102359
ISSUE DATE / TIME: 202111061757
ISSUE DATE / TIME: 202111081038
ISSUE DATE / TIME: 202111081644
ISSUE DATE / TIME: 202111081644
Unit Type and Rh: 5100
Unit Type and Rh: 5100
Unit Type and Rh: 5100
Unit Type and Rh: 5100

## 2019-12-03 LAB — BASIC METABOLIC PANEL
Anion gap: 11 (ref 5–15)
BUN: 12 mg/dL (ref 8–23)
CO2: 30 mmol/L (ref 22–32)
Calcium: 8.4 mg/dL — ABNORMAL LOW (ref 8.9–10.3)
Chloride: 95 mmol/L — ABNORMAL LOW (ref 98–111)
Creatinine, Ser: 0.62 mg/dL (ref 0.44–1.00)
GFR, Estimated: >60 mL/min (ref >60)
Glucose, Bld: 138 mg/dL — ABNORMAL HIGH (ref 70–99)
Potassium: 3.3 mmol/L — ABNORMAL LOW (ref 3.5–5.1)
Sodium: 136 mmol/L (ref 135–145)

## 2019-12-03 LAB — COOXEMETRY PANEL
Carboxyhemoglobin: 1 % (ref 0.5–1.5)
Methemoglobin: 0.9 % (ref 0.0–1.5)
O2 Saturation: 64.4 %
Total hemoglobin: 11.3 g/dL — ABNORMAL LOW (ref 12.0–16.0)

## 2019-12-03 LAB — CBC
HCT: 33.2 % — ABNORMAL LOW (ref 36.0–46.0)
Hemoglobin: 11 g/dL — ABNORMAL LOW (ref 12.0–15.0)
MCH: 30.7 pg (ref 26.0–34.0)
MCHC: 33.1 g/dL (ref 30.0–36.0)
MCV: 92.7 fL (ref 80.0–100.0)
Platelets: 252 10*3/uL (ref 150–400)
RBC: 3.58 MIL/uL — ABNORMAL LOW (ref 3.87–5.11)
RDW: 14.6 % (ref 11.5–15.5)
WBC: 17 10*3/uL — ABNORMAL HIGH (ref 4.0–10.5)
nRBC: 0 % (ref 0.0–0.2)

## 2019-12-03 MED ORDER — POTASSIUM CHLORIDE CRYS ER 20 MEQ PO TBCR
20.0000 meq | EXTENDED_RELEASE_TABLET | ORAL | Status: AC
  Administered 2019-12-03 (×3): 20 meq via ORAL
  Filled 2019-12-03 (×4): qty 1

## 2019-12-03 MED ORDER — PHENOL 1.4 % MT LIQD
1.0000 | OROMUCOSAL | Status: DC | PRN
  Administered 2019-12-03: 1 via OROMUCOSAL
  Filled 2019-12-03: qty 177

## 2019-12-03 MED ORDER — AMIODARONE HCL 200 MG PO TABS
400.0000 mg | ORAL_TABLET | Freq: Two times a day (BID) | ORAL | Status: DC
  Administered 2019-12-03 – 2019-12-05 (×6): 400 mg via ORAL
  Filled 2019-12-03 (×6): qty 2

## 2019-12-03 MED ORDER — IBUPROFEN 200 MG PO TABS
400.0000 mg | ORAL_TABLET | Freq: Three times a day (TID) | ORAL | Status: AC
  Administered 2019-12-03 – 2019-12-04 (×4): 400 mg via ORAL
  Filled 2019-12-03 (×6): qty 2

## 2019-12-03 NOTE — Progress Notes (Signed)
Inpatient Rehabilitation-Admissions Coordinator   CIR consult received. Noted pt is doing quite well functionally with PT and OT recommending no follow up at this time. As the pt does not need an IP Rehab stay, this AC will DC order and sign off.  Raechel Ache, OTR/L  Rehab Admissions Coordinator  [phone] 12/03/2019 5:02 PM

## 2019-12-03 NOTE — Progress Notes (Signed)
4 Days Post-Op Procedure(s) (LRB): CORONARY ARTERY BYPASS GRAFTING (CABG) x 6 ON PUMP. USING LEFT INTERNAL MAMMARY ARTERY, LEFT RADIAL ARTERY AND RIGHT GREATER SAPHENOUS VEIN. (N/A) RADIAL ARTERY HARVEST (Left) TRANSESOPHAGEAL ECHOCARDIOGRAM (TEE) (N/A) INDOCYANINE GREEN FLUORESCENCE IMAGING (ICG) (N/A) ENDOVEIN HARVEST OF GREATER SAPHENOUS VEIN (Right) Subjective: Short of breath Objective: Vital signs in last 24 hours: Temp:  [97.4 F (36.3 C)-98.1 F (36.7 C)] 98 F (36.7 C) (11/12 0808) Pulse Rate:  [73-91] 85 (11/12 0800) Cardiac Rhythm: Normal sinus rhythm (11/12 0300) Resp:  [16-26] 23 (11/12 0800) BP: (112-160)/(48-88) 160/85 (11/12 0800) SpO2:  [89 %-100 %] 100 % (11/12 0800) Weight:  [90.1 kg] 90.1 kg (11/12 0500)  Hemodynamic parameters for last 24 hours:    Intake/Output from previous day: 11/11 0701 - 11/12 0700 In: 765.3 [P.O.:120; I.V.:495.1; IV Piggyback:150.2] Out: 502 [Urine:501; Stool:1] Intake/Output this shift: No intake/output data recorded.  General appearance: alert and cooperative Neurologic: intact Heart: regular rate and rhythm, S1, S2 normal, no murmur, click, rub or gallop Lungs: clear to auscultation bilaterally Abdomen: soft, non-tender; bowel sounds normal; no masses,  no organomegaly Extremities: edema mild Wound: c/d/i  Lab Results: Recent Labs    12/02/19 0503 12/03/19 0503  WBC 17.0* 17.0*  HGB 10.5* 11.0*  HCT 32.0* 33.2*  PLT 192 252   BMET:  Recent Labs    12/02/19 0503 12/02/19 0503 12/02/19 1830 12/03/19 0503  NA 136  --   --  136  K 3.3*   < > 3.2* 3.3*  CL 96*  --   --  95*  CO2 30  --   --  30  GLUCOSE 122*  --   --  138*  BUN 17  --   --  12  CREATININE 0.66  --   --  0.62  CALCIUM 8.2*  --   --  8.4*   < > = values in this interval not displayed.    PT/INR: No results for input(s): LABPROT, INR in the last 72 hours. ABG    Component Value Date/Time   PHART 7.315 (L) 11/30/2019 0545   HCO3 27.0  11/30/2019 0545   TCO2 29 11/30/2019 0545   ACIDBASEDEF 1.0 11/29/2019 2214   O2SAT 64.4 12/03/2019 0503   CBG (last 3)  Recent Labs    12/02/19 2017 12/02/19 2314 12/03/19 0653  GLUCAP 134* 116* 139*    Assessment/Plan: S/P Procedure(s) (LRB): CORONARY ARTERY BYPASS GRAFTING (CABG) x 6 ON PUMP. USING LEFT INTERNAL MAMMARY ARTERY, LEFT RADIAL ARTERY AND RIGHT GREATER SAPHENOUS VEIN. (N/A) RADIAL ARTERY HARVEST (Left) TRANSESOPHAGEAL ECHOCARDIOGRAM (TEE) (N/A) INDOCYANINE GREEN FLUORESCENCE IMAGING (ICG) (N/A) ENDOVEIN HARVEST OF GREATER SAPHENOUS VEIN (Right) Mobilize Diuresis See progression orders   LOS: 4 days    Wonda Olds 12/03/2019

## 2019-12-03 NOTE — Progress Notes (Signed)
CARDIAC REHAB PHASE I   Offered to walk with pt. Pt declining at this time, states she has walked three times today. Pt states she is feeling much better today, and thinks she has turned a corner. Encouraged continued IS and Flutter use along with walks. Will continue to follow.  [place], RN BSN 12/03/2019 1:54 PM

## 2019-12-03 NOTE — Evaluation (Signed)
Occupational Therapy Evaluation Patient Details Name: Jamie Hodge MRN: [id number] DOB: [birth year] Today's Date: 12/03/2019    History of Present Illness Pt is a 79 year old woman admitted for CABG x 6. Hospital course complicated by difficulty weaning and nausea. 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   Clinical Impression   Pt was independent prior to admission. Ambulated in hall with eva walker on 3L 02 and supervision and to bathroom without DME and min guard assist. She needs up to min assist for ADL. Pt likely to progress well and not require post acute OT. Will follow acutely.    Follow Up Recommendations  No OT follow up    Equipment Recommendations  None recommended by OT    Recommendations for Other Services       Precautions / Restrictions Precautions Precautions: Fall;Sternal Precaution Booklet Issued: No Precaution Comments: verbally educated in sternal precautions Restrictions Weight Bearing Restrictions: Yes (Sternal precautions)      Mobility Bed Mobility               General bed mobility comments: pt in chair    Transfers Overall transfer level: Needs assistance   Transfers: Sit to/from Stand Sit to Stand: Supervision              Balance Overall balance assessment: Mild deficits observed, not formally tested                                         ADL either performed or assessed with clinical judgement   ADL Overall ADL's : Needs assistance/impaired Eating/Feeding: Independent   Grooming: Wash/dry hands;Standing;Min guard   Upper Body Bathing: Minimal assistance;Sitting   Lower Body Bathing: Minimal assistance;Sit to/from stand   Upper Body Dressing : Minimal assistance;Sitting   Lower Body Dressing: Minimal assistance;Sit to/from stand   Toilet Transfer: Min guard;Ambulation;Comfort height toilet   Toileting- Clothing Manipulation and Hygiene: Minimal assistance;Sit to/from stand        Functional mobility during ADLs: Min guard (eva walker) General ADL Comments: Instructed in IADL to avoid.     Vision Patient Visual Report: No change from baseline       Perception     Praxis      Pertinent Vitals/Pain Pain Assessment: Faces Faces Pain Scale: Hurts a little bit Pain Location: chest incision Pain Descriptors / Indicators: Sore Pain Intervention(s): Monitored during session     Hand Dominance Left   Extremity/Trunk Assessment Upper Extremity Assessment Upper Extremity Assessment: Overall WFL for tasks assessed   Lower Extremity Assessment Lower Extremity Assessment: Defer to PT evaluation       Communication Communication Communication: No difficulties   Cognition Arousal/Alertness: Awake/alert Behavior During Therapy: WFL for tasks assessed/performed Overall Cognitive Status: Within Functional Limits for tasks assessed                                     General Comments       Exercises     Shoulder Instructions      Home Living Family/patient expects to be discharged to:: Private residence Living Arrangements: Spouse/significant other Available Help at Discharge: Family;Available 24 hours/day Type of Home: House Home Access: Stairs to enter [employer] of Steps: 2 Entrance Stairs-Rails: None Home Layout: Two level;Able to live on main level with bedroom/bathroom  Bathroom Shower/Tub: Occupational psychologist: Handicapped height     Home Equipment: Shower seat;Cane - single point;Walker - 2 wheels;Bedside commode          Prior Functioning/Environment Level of Independence: Independent                 OT Problem List: Decreased activity tolerance;Pain      OT Treatment/Interventions: Self-care/ADL training;Energy conservation;DME and/or AE instruction;Patient/family education;Balance training;Therapeutic activities    OT Goals(Current goals can be found in the care plan  section) Acute Rehab OT Goals Patient Stated Goal: return home OT Goal Formulation: With patient Time For Goal Achievement: 12/17/19 Potential to Achieve Goals: Good ADL Goals Pt Will Transfer to Toilet: with modified independence;ambulating Pt Will Perform Tub/Shower Transfer: Shower transfer;with supervision;ambulating;shower seat Additional ADL Goal #1: Pt will adhere to sternal precautions. Additional ADL Goal #2: Pt will generalize energy conservation strategies. Additional ADL Goal #3: Pt will complete sponge bathing and dressing modified independently.  OT Frequency: Min 2X/week   Barriers to D/C:            Co-evaluation              AM-PAC OT "6 Clicks" Daily Activity     Outcome Measure Help from another person eating meals?: None Help from another person taking care of personal grooming?: A Little Help from another person toileting, which includes using toliet, bedpan, or urinal?: A Little Help from another person bathing (including washing, rinsing, drying)?: A Little Help from another person to put on and taking off regular upper body clothing?: A Little Help from another person to put on and taking off regular lower body clothing?: A Little 6 Click Score: 19   End of Session Equipment Utilized During Treatment: Oxygen;Other (comment) (3L, EVA walker)  Activity Tolerance: Patient tolerated treatment well Patient left: in chair;with call bell/phone within reach  OT Visit Diagnosis: Unsteadiness on feet (R26.81);Pain                Time: 9373-4287 OT Time Calculation (min): 30 min Charges:  OT General Charges $OT Visit: 1 Visit OT Evaluation $OT Eval Moderate Complexity: 1 Mod OT Treatments $Self Care/Home Management : 8-22 mins  Nestor Lewandowsky, OTR/L Acute Rehabilitation Services Pager: [phone] Office: [phone]  Jamie Hodge 12/03/2019, 10:58 AM

## 2019-12-03 NOTE — Progress Notes (Signed)
Patient transported to 2C11 via wheelchair with all belongings. Vinnie Level, RN at bedside to receive patient upon arrival.

## 2019-12-03 NOTE — Hospital Course (Addendum)
History of Present Illness:     79 yo lady experienced right UE weakness and slurred speech in Sept 2021. 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 This was followed with CT coronary suggesting severe CAD, confirmed on LHC this past week. She was admitted for with plan for surgery but was medically stabilized and sent home. Now represents for CABG surgery. 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  Hospital Course: The patient was admitted and on 11/29/2019 taken the operating room where she underwent the below described procedure.  She tolerated it well was taken to the surgical ICU in critical condition.  Postoperative hospital course  Patient was stabilized in the SICU and CCM consultation was obtained to assist with management.  She failed initial weaning on 2 occasions.  This was initially felt to be related to suboptimal pain control.  As this improved her pulmonary status was felt to be adequate to attempt a spontaneous breathing trial and she was subsequently extubated after approximately 60 minutes of tolerating this.  Post extubation she had no respiratory distress.  She has also had postoperative atrial fibrillation and was placed on amiodarone infusion with subsequent conversion to sinus rhythm.  She does have a postoperative volume overload and is being treated aggressively with diuretics.  Over the weekend of 11/13-14 she has shown a steady improvement.  She has had some hypertensive readings but mostly well controlled.  Oxygen has continued to be weaned.  She has had some sinus tachycardia and PVCs and beta blocker dose has been increased.  She continues amiodarone as well.  Her anemia has remained stable.  She has diuresed well and Lasix has been decreased.  Epicardial pacing wires have been removed.

## 2019-12-03 NOTE — Progress Notes (Signed)
      [address]       [place] [address]             [phone]      POD #4 CABG x 6  Resting comfortably  BP 127/69   Pulse 93   Temp 99 F (37.2 C) (Oral)   Resp 18   Ht 5\' 4"  (1.626 m)   Wt 90.1 kg   SpO2 94%   BMI 34.10 kg/m   In SR  Intake/Output Summary (Last 24 hours) at 12/03/2019 1706 Last data filed at 12/03/2019 1600 Gross per 24 hour  Intake 774.04 ml  Output --  Net 774.04 ml   Awaiting bed on 4e  Jamie Giacomo C. Roxan Hockey, MD [facility] [phone]

## 2019-12-03 NOTE — Plan of Care (Signed)

## 2019-12-03 NOTE — Evaluation (Signed)
Physical Therapy Evaluation Patient Details Name: Jamie Hodge MRN: [id number] DOB: October 03, 1940 Today's Date: 12/03/2019   History of Present Illness  Pt is a 79 year old woman admitted for CABG x 6. Hospital course complicated by difficulty weaning and nausea. 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  Clinical Impression  PTA pt living with husband in multistory home with 2 steps to enter, bed and bath on first floor. Pt was completely independent prior to hospitalization. Pt is currently min guard for transfers and ambulation of 420 feet with EVA walker. Pt is able to recall sternal precautions and needs minimal reminding for "moving in the tube".  PT does not feel pt will have any PT or equipment needs at discharge, however will continue to follow acutely to progress mobility.     Follow Up Recommendations No PT follow up;Supervision for mobility/OOB    Equipment Recommendations  None recommended by PT       Precautions / Restrictions Precautions Precautions: Fall;Sternal Precaution Booklet Issued: No Precaution Comments: verbally educated in sternal precautions Restrictions Weight Bearing Restrictions: Yes Other Position/Activity Restrictions: sternal precautions      Mobility  Bed Mobility               General bed mobility comments: pt in chair    Transfers Overall transfer level: Needs assistance   Transfers: Sit to/from Stand Sit to Stand: Supervision            Ambulation/Gait Ambulation/Gait assistance: Min guard Gait Distance (Feet): 420 Feet Assistive device:  (Eva walker) Gait Pattern/deviations: Step-through pattern;Decreased step length - right;Decreased step length - left Gait velocity: slowed Gait velocity interpretation: 1.31 - 2.62 ft/sec, indicative of limited community ambulator General Gait Details: min guard for safety, slow, steady gait with 1x standing rest break         Balance Overall balance assessment: Mild  deficits observed, not formally tested                                           Pertinent Vitals/Pain Pain Assessment: Faces Faces Pain Scale: Hurts a little bit Pain Location: chest incision Pain Descriptors / Indicators: Sore Pain Intervention(s): Limited activity within patient's tolerance;Monitored during session;Repositioned    Home Living Family/patient expects to be discharged to:: Private residence Living Arrangements: Spouse/significant other Available Help at Discharge: Family;Available 24 hours/day Type of Home: House Home Access: Stairs to enter Entrance Stairs-Rails: None Entrance Stairs-Number of Steps: 2 Home Layout: Two level;Able to live on main level with bedroom/bathroom Home Equipment: Shower seat;Cane - single point;Walker - 2 wheels;Bedside commode      Prior Function Level of Independence: Independent               Hand Dominance   Dominant Hand: Left    Extremity/Trunk Assessment   Upper Extremity Assessment Upper Extremity Assessment: Defer to OT evaluation    Lower Extremity Assessment Lower Extremity Assessment: Overall WFL for tasks assessed       Communication   Communication: No difficulties  Cognition Arousal/Alertness: Awake/alert Behavior During Therapy: WFL for tasks assessed/performed Overall Cognitive Status: Within Functional Limits for tasks assessed                                        General Comments General comments (  skin integrity, edema, etc.): VSS on 2L O2 via [place]        Assessment/Plan    PT Assessment Patient needs continued PT services  PT Problem List Decreased strength;Decreased activity tolerance;Cardiopulmonary status limiting activity       PT Treatment Interventions DME instruction;Gait training;Stair training;Functional mobility training;Therapeutic activities;Therapeutic exercise;Balance training;Cognitive remediation;Wheelchair mobility training    PT  Goals (Current goals can be found in the Care Plan section)  Acute Rehab PT Goals Patient Stated Goal: return home PT Goal Formulation: With patient Time For Goal Achievement: 12/17/19 Potential to Achieve Goals: Good    Frequency Min 3X/week    AM-PAC PT "6 Clicks" Mobility  Outcome Measure Help needed turning from your back to your side while in a flat bed without using bedrails?: None Help needed moving from lying on your back to sitting on the side of a flat bed without using bedrails?: A Little Help needed moving to and from a bed to a chair (including a wheelchair)?: None Help needed standing up from a chair using your arms (e.g., wheelchair or bedside chair)?: None Help needed to walk in hospital room?: None Help needed climbing 3-5 steps with a railing? : A Little 6 Click Score: 22    End of Session Equipment Utilized During Treatment: Gait belt;Oxygen Activity Tolerance: Patient tolerated treatment well Patient left: in chair;with call bell/phone within reach Nurse Communication: Mobility status PT Visit Diagnosis: Muscle weakness (generalized) (M62.81);Difficulty in walking, not elsewhere classified (R26.2)    Time: 4097-3532 PT Time Calculation (min) (ACUTE ONLY): 17 min   Charges:   PT Evaluation $PT Eval Moderate Complexity: 1 Mod          Jenniffer Vessels B. Migdalia Dk PT, DPT Acute Rehabilitation Services Pager [phone] Office [phone]   [place] 12/03/2019, 3:57 PM

## 2019-12-03 NOTE — Plan of Care (Signed)

## 2019-12-03 NOTE — Progress Notes (Signed)
PCCM progress note  Patient progressing well 4 days post CABG.  Appears oxygen requirements are stable and milrinone has been discontinued.  No further PCCM inpatient needs identified will sign off.  Please call for any additional assistance.  Johnsie Cancel, NP-C [facility] Contact / Pager information can be found on Amion  12/03/2019, 9:34 AM

## 2019-12-04 ENCOUNTER — Inpatient Hospital Stay (HOSPITAL_COMMUNITY): Payer: Medicare Other

## 2019-12-04 LAB — BASIC METABOLIC PANEL
Anion gap: 9 (ref 5–15)
BUN: 14 mg/dL (ref 8–23)
CO2: 34 mmol/L — ABNORMAL HIGH (ref 22–32)
Calcium: 8.4 mg/dL — ABNORMAL LOW (ref 8.9–10.3)
Chloride: 94 mmol/L — ABNORMAL LOW (ref 98–111)
Creatinine, Ser: 0.85 mg/dL (ref 0.44–1.00)
GFR, Estimated: >60 mL/min (ref >60)
Glucose, Bld: 108 mg/dL — ABNORMAL HIGH (ref 70–99)
Potassium: 3.3 mmol/L — ABNORMAL LOW (ref 3.5–5.1)
Sodium: 137 mmol/L (ref 135–145)

## 2019-12-04 LAB — GLUCOSE, CAPILLARY
Glucose-Capillary: 108 mg/dL — ABNORMAL HIGH (ref 70–99)
Glucose-Capillary: 115 mg/dL — ABNORMAL HIGH (ref 70–99)
Glucose-Capillary: 134 mg/dL — ABNORMAL HIGH (ref 70–99)
Glucose-Capillary: 83 mg/dL (ref 70–99)
Glucose-Capillary: 92 mg/dL (ref 70–99)
Glucose-Capillary: 95 mg/dL (ref 70–99)

## 2019-12-04 LAB — CBC
HCT: 32.6 % — ABNORMAL LOW (ref 36.0–46.0)
Hemoglobin: 10.7 g/dL — ABNORMAL LOW (ref 12.0–15.0)
MCH: 30 pg (ref 26.0–34.0)
MCHC: 32.8 g/dL (ref 30.0–36.0)
MCV: 91.3 fL (ref 80.0–100.0)
Platelets: 260 10*3/uL (ref 150–400)
RBC: 3.57 MIL/uL — ABNORMAL LOW (ref 3.87–5.11)
RDW: 14.5 % (ref 11.5–15.5)
WBC: 14.3 10*3/uL — ABNORMAL HIGH (ref 4.0–10.5)
nRBC: 0 % (ref 0.0–0.2)

## 2019-12-04 MED ORDER — POTASSIUM CHLORIDE CRYS ER 20 MEQ PO TBCR
40.0000 meq | EXTENDED_RELEASE_TABLET | Freq: Two times a day (BID) | ORAL | Status: AC
  Administered 2019-12-04 (×2): 40 meq via ORAL
  Filled 2019-12-04 (×2): qty 2

## 2019-12-04 MED ORDER — METOPROLOL TARTRATE 25 MG PO TABS
25.0000 mg | ORAL_TABLET | Freq: Two times a day (BID) | ORAL | Status: DC
  Administered 2019-12-04 – 2019-12-05 (×3): 25 mg via ORAL
  Filled 2019-12-04 (×3): qty 1

## 2019-12-04 MED ORDER — METOPROLOL TARTRATE 25 MG/10 ML ORAL SUSPENSION: 12.5000 mg | Freq: Two times a day (BID) | ORAL | Status: DC

## 2019-12-04 MED ORDER — FUROSEMIDE 40 MG PO TABS
40.0000 mg | ORAL_TABLET | Freq: Every day | ORAL | Status: DC
  Administered 2019-12-05: 40 mg via ORAL
  Filled 2019-12-04: qty 1

## 2019-12-04 NOTE — Plan of Care (Signed)

## 2019-12-04 NOTE — Progress Notes (Addendum)
CARDIAC REHAB PHASE I   Cardiac Rehab RN in to ambulate with patient. Per primary RN pacer wires recently pulled and patient needs to remain on bedrest at this time. Primary RN states she will ambulate with patient later today. Will follow up on Monday.  Shanzay Hepworth Minus Breeding RN, BSN

## 2019-12-04 NOTE — Progress Notes (Addendum)
[address]       [place] [address]             [phone]      5 Days Post-Op Procedure(s) (LRB): CORONARY ARTERY BYPASS GRAFTING (CABG) x 6 ON PUMP. USING LEFT INTERNAL MAMMARY ARTERY, LEFT RADIAL ARTERY AND RIGHT GREATER SAPHENOUS VEIN. (N/A) RADIAL ARTERY HARVEST (Left) TRANSESOPHAGEAL ECHOCARDIOGRAM (TEE) (N/A) INDOCYANINE GREEN FLUORESCENCE IMAGING (ICG) (N/A) ENDOVEIN HARVEST OF GREATER SAPHENOUS VEIN (Right) Subjective: goos day yesterday, more tired today  Objective: Vital signs in last 24 hours: Temp:  [98.2 F (36.8 C)-99 F (37.2 C)] 98.2 F (36.8 C) (11/13 0749) Pulse Rate:  [84-101] 98 (11/13 0749) Cardiac Rhythm: Normal sinus rhythm (11/13 0749) Resp:  [13-20] 18 (11/13 0749) BP: (120-151)/(61-74) 139/67 (11/13 0749) SpO2:  [92 %-99 %] 99 % (11/13 0749) Weight:  [87.5 kg] 87.5 kg (11/13 0608)  Hemodynamic parameters for last 24 hours:    Intake/Output from previous day: 11/12 0701 - 11/13 0700 In: 574.7 [P.O.:540; I.V.:34.7] Out: -  Intake/Output this shift: Total I/O In: 240 [P.O.:240] Out: -   General appearance: alert, cooperative and no distress Heart: regular rate and rhythm and tachy Lungs: dim in bases Abdomen: benign Extremities: min edema Wound: incis healing well  Lab Results: Recent Labs    12/03/19 0503 12/04/19 0101  WBC 17.0* 14.3*  HGB 11.0* 10.7*  HCT 33.2* 32.6*  PLT 252 260   BMET:  Recent Labs    12/03/19 0503 12/04/19 0101  NA 136 137  K 3.3* 3.3*  CL 95* 94*  CO2 30 34*  GLUCOSE 138* 108*  BUN 12 14  CREATININE 0.62 0.85  CALCIUM 8.4* 8.4*    PT/INR: No results for input(s): LABPROT, INR in the last 72 hours. ABG    Component Value Date/Time   PHART 7.315 (L) 11/30/2019 0545   HCO3 27.0 11/30/2019 0545   TCO2 29 11/30/2019 0545   ACIDBASEDEF 1.0 11/29/2019 2214   O2SAT 64.4 12/03/2019 0503   CBG (last 3)  Recent Labs    12/03/19 2318 12/04/19 0359 12/04/19 0735    GLUCAP 124* 95 83    Meds Scheduled Meds: . acetaminophen  1,000 mg Oral Q6H   Or  . acetaminophen (TYLENOL) oral liquid 160 mg/5 mL  1,000 mg Per Tube Q6H  . amiodarone  400 mg Oral BID  . aspirin EC  81 mg Oral Daily  . atorvastatin  80 mg Oral Daily  . bisacodyl  10 mg Oral Daily   Or  . bisacodyl  10 mg Rectal Daily  . Chlorhexidine Gluconate Cloth  6 each Topical Daily  . clopidogrel  75 mg Oral Daily  . docusate sodium  200 mg Oral Daily  . furosemide  40 mg Intravenous BID  . ibuprofen  400 mg Oral TID  . insulin aspart  0-24 Units Subcutaneous Q4H  . isosorbide dinitrate  10 mg Oral TID  . mouth rinse  15 mL Mouth Rinse BID  . metoprolol tartrate  12.5 mg Oral BID   Or  . metoprolol tartrate  12.5 mg Per Tube BID  . pantoprazole  40 mg Oral Daily   Continuous Infusions: . lactated ringers     PRN Meds:.lactated ringers, levalbuterol, metoprolol tartrate, ondansetron (ZOFRAN) IV, oxyCODONE, phenol, sertraline, traMADol  Xrays DG Chest 2 View  Result Date: 12/03/2019 CLINICAL DATA:  Status post coronary artery bypass grafting. Atelectasis. EXAM: CHEST - 2 VIEW COMPARISON:  December 02, 2019 FINDINGS: Cordis tip remains in the superior vena cava. No pneumothorax. There is a small right pleural effusion with bibasilar atelectasis. Lungs elsewhere clear. Heart is upper normal in size with pulmonary vascularity normal. No adenopathy. Status post median sternotomy. No appreciable bone lesions. IMPRESSION: Small right pleural effusion with bibasilar atelectasis. Heart upper normal in size. Postoperative changes. Central catheter tip in superior vena cava without evident pneumothorax. Electronically Signed   By: Lowella Grip III M.D.   On: 12/03/2019 08:06   DG Chest Port 1 View  Result Date: 12/04/2019 CLINICAL DATA:  Open heart surgery. EXAM: PORTABLE CHEST 1 VIEW COMPARISON:  12/03/2019 FINDINGS: 0523 hours. The cardio pericardial silhouette is enlarged. Asymmetric  elevation right hemidiaphragm. Improved aeration at the lung bases with persistent basilar atelectasis and probable tiny effusions. No pulmonary edema. Bones are diffusely demineralized. Telemetry leads overlie the chest. IMPRESSION: Improved aeration at the lung bases with persistent basilar atelectasis and probable tiny effusions. Electronically Signed   By: Misty Stanley M.D.   On: 12/04/2019 08:11   ECHOCARDIOGRAM LIMITED  Result Date: 12/02/2019    ECHOCARDIOGRAM LIMITED REPORT   Patient Name:   Jamie Hodge Date of Exam: 12/02/2019 Medical Rec #:  [id number]           Height:       64.0 in Accession #:    [id number]          Weight:       203.7 lb Date of Birth:  04-11-1940           BSA:          1.972 m Patient Age:    79 years            BP:           125/55 mmHg Patient Gender: F                   HR:           81 bpm. Exam Location:  Inpatient Procedure: Limited Echo, Limited Color Doppler and Cardiac Doppler Indications:    CABG x6  History:        Patient has prior history of Echocardiogram examinations, most                 recent 09/25/2019. CAD, Stroke; Risk Factors:Dyslipidemia and                 Hypertension.  Sonographer:    Johny Chess Referring Phys: Kipp Brood IMPRESSIONS  1. Left ventricular ejection fraction, by estimation, is 55%. The left ventricle has normal function. 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 Left ventricular diastolic parameters are consistent with Grade I diastolic dysfunction (impaired relaxation). No thrombus noted.  2. The mitral valve is normal in structure. No evidence of mitral valve regurgitation. No evidence of mitral stenosis.  3. The aortic valve is tricuspid. Aortic valve regurgitation is not visualized. No aortic stenosis is present.  4. Right ventricular systolic function is normal. The right ventricular size is normal. Tricuspid regurgitation signal is  inadequate for assessing PA pressure.  5. No free-flowing pericardial effusion noted, but there does appear to be some thickening of the pericardium (?hematoma) around the RV. There is respirophasic variation in the position of the interventricular septum, suggesting pericardial disease.  6. The IVC was not visualized. FINDINGS  Left Ventricle: Left ventricular ejection fraction, by estimation, is 55%. The  left ventricle has normal function. The left ventricle demonstrates regional wall motion abnormalities. The left ventricular internal cavity size was normal in size. There is  no left ventricular hypertrophy. Right Ventricle: The right ventricular size is normal. No increase in right ventricular wall thickness. Right ventricular systolic function is normal. Tricuspid regurgitation signal is inadequate for assessing PA pressure. Pericardium: No free-flowing pericardial effusion noted, but there does appear to be some thickening of the pericardium (?hematoma) around the RV. There is respirophasic variation in the position of the interventricular septum, suggesting pericardial disease. Mitral Valve: The mitral valve is normal in structure. Mild mitral annular calcification. No evidence of mitral valve stenosis. Aortic Valve: The aortic valve is tricuspid. Aortic valve regurgitation is not visualized. No aortic stenosis is present. Venous: The inferior vena cava was not well visualized. LEFT VENTRICLE PLAX 2D LVIDd:         4.20 cm  Diastology LV PW:         1.20 cm  LV e' medial:    4.90 cm/s LV IVS:        0.90 cm  LV E/e' medial:  13.3 LVOT diam:     1.90 cm  LV e' lateral:   6.85 cm/s LV SV:         56       LV E/e' lateral: 9.5 LV SV Index:   29 LVOT Area:     2.84 cm  AORTIC VALVE LVOT Vmax:   92.70 cm/s LVOT Vmean:  58.700 cm/s LVOT VTI:    0.199 m MITRAL VALVE MV Area (PHT): 3.42 cm    SHUNTS MV Decel Time: 222 msec    Systemic VTI:  0.20 m MV E velocity: 65.10 cm/s  Systemic Diam: 1.90 cm MV A velocity:  93.00 cm/s MV E/A ratio:  0.70 Loralie Champagne MD Electronically signed by Loralie Champagne MD Signature Date/Time: 12/02/2019/3:41:34 PM    Final     Assessment/Plan: S/P Procedure(s) (LRB): CORONARY ARTERY BYPASS GRAFTING (CABG) x 6 ON PUMP. USING LEFT INTERNAL MAMMARY ARTERY, LEFT RADIAL ARTERY AND RIGHT GREATER SAPHENOUS VEIN. (N/A) RADIAL ARTERY HARVEST (Left) TRANSESOPHAGEAL ECHOCARDIOGRAM (TEE) (N/A) INDOCYANINE GREEN FLUORESCENCE IMAGING (ICG) (N/A) ENDOVEIN HARVEST OF GREATER SAPHENOUS VEIN (Right)  1 Tmax 99, VSS, some systolic HTN but mostly well controlled 2 sats good on 2 liters 3 NSR,sinus tachy some PVC's, will increase beta blocker dose 4 leukocytosis trend improved 5 stable ABL anemia  6 BS  adeq control 7 hypokalemia - will replace 8 CXR stable , elev hemidiaphram present prop 9 weight appears below preop- will decrease lasix 10 D/c wires in am if rhythm stable  LOS: 5 days    John Giovanni PA-C Pager [phone] 12/04/2019 Patient seen and examined, agree with assessment and plan as outlined  Revonda Standard. Roxan Hockey, MD [facility] [phone]

## 2019-12-05 LAB — BASIC METABOLIC PANEL
Anion gap: 8 (ref 5–15)
BUN: 17 mg/dL (ref 8–23)
CO2: 31 mmol/L (ref 22–32)
Calcium: 8.7 mg/dL — ABNORMAL LOW (ref 8.9–10.3)
Chloride: 96 mmol/L — ABNORMAL LOW (ref 98–111)
Creatinine, Ser: 0.76 mg/dL (ref 0.44–1.00)
GFR, Estimated: >60 mL/min (ref >60)
Glucose, Bld: 124 mg/dL — ABNORMAL HIGH (ref 70–99)
Potassium: 3.9 mmol/L (ref 3.5–5.1)
Sodium: 135 mmol/L (ref 135–145)

## 2019-12-05 LAB — GLUCOSE, CAPILLARY
Glucose-Capillary: 105 mg/dL — ABNORMAL HIGH (ref 70–99)
Glucose-Capillary: 103 mg/dL — ABNORMAL HIGH (ref 70–99)
Glucose-Capillary: 89 mg/dL (ref 70–99)
Glucose-Capillary: 114 mg/dL — ABNORMAL HIGH (ref 70–99)
Glucose-Capillary: 130 mg/dL — ABNORMAL HIGH (ref 70–99)
Glucose-Capillary: 125 mg/dL — ABNORMAL HIGH (ref 70–99)

## 2019-12-05 NOTE — Progress Notes (Signed)
6 Days Post-Op Procedure(s) (LRB): CORONARY ARTERY BYPASS GRAFTING (CABG) x 6 ON PUMP. USING LEFT INTERNAL MAMMARY ARTERY, LEFT RADIAL ARTERY AND RIGHT GREATER SAPHENOUS VEIN. (N/A) RADIAL ARTERY HARVEST (Left) TRANSESOPHAGEAL ECHOCARDIOGRAM (TEE) (N/A) INDOCYANINE GREEN FLUORESCENCE IMAGING (ICG) (N/A) ENDOVEIN HARVEST OF GREATER SAPHENOUS VEIN (Right) Subjective: No complaints, off O2  Objective: Vital signs in last 24 hours: Temp:  [97.2 F (36.2 C)-98.2 F (36.8 C)] 98.2 F (36.8 C) (11/14 0845) Pulse Rate:  [74-98] 88 (11/14 0845) Cardiac Rhythm: Normal sinus rhythm (11/14 0701) Resp:  [9-24] 20 (11/14 0845) BP: (114-140)/(52-97) 138/97 (11/14 0845) SpO2:  [97 %-99 %] 98 % (11/14 0845) Weight:  [86.8 kg] 86.8 kg (11/14 0402)  Hemodynamic parameters for last 24 hours:    Intake/Output from previous day: 11/13 0701 - 11/14 0700 In: 720 [P.O.:720] Out: -  Intake/Output this shift: Total I/O In: 240 [P.O.:240] Out: -   General appearance: alert, cooperative and no distress Neurologic: intact Heart: regular rate and rhythm Lungs: diminished breath sounds right base Wound: clean and intact  Lab Results: Recent Labs    12/03/19 0503 12/04/19 0101  WBC 17.0* 14.3*  HGB 11.0* 10.7*  HCT 33.2* 32.6*  PLT 252 260   BMET:  Recent Labs    12/04/19 0101 12/05/19 0039  NA 137 135  K 3.3* 3.9  CL 94* 96*  CO2 34* 31  GLUCOSE 108* 124*  BUN 14 17  CREATININE 0.85 0.76  CALCIUM 8.4* 8.7*    PT/INR: No results for input(s): LABPROT, INR in the last 72 hours. ABG    Component Value Date/Time   PHART 7.315 (L) 11/30/2019 0545   HCO3 27.0 11/30/2019 0545   TCO2 29 11/30/2019 0545   ACIDBASEDEF 1.0 11/29/2019 2214   O2SAT 64.4 12/03/2019 0503   CBG (last 3)  Recent Labs    12/04/19 2333 12/05/19 0505 12/05/19 0734  GLUCAP 134* 105* 103*    Assessment/Plan: S/P Procedure(s) (LRB): CORONARY ARTERY BYPASS GRAFTING (CABG) x 6 ON PUMP. USING LEFT INTERNAL  MAMMARY ARTERY, LEFT RADIAL ARTERY AND RIGHT GREATER SAPHENOUS VEIN. (N/A) RADIAL ARTERY HARVEST (Left) TRANSESOPHAGEAL ECHOCARDIOGRAM (TEE) (N/A) INDOCYANINE GREEN FLUORESCENCE IMAGING (ICG) (N/A) ENDOVEIN HARVEST OF GREATER SAPHENOUS VEIN (Right) -Doing well  Cv- in SR on amiodarone and lopressor  Dc pacing wires RESP- off O2, continue IS RENAL- creatinine and lytes oK ENDO_ CBG well controlled GI- tolerating diet  Dc pacing wires today Home tomorrow if rhythm stable    LOS: 6 days    Melrose Nakayama 12/05/2019

## 2019-12-05 NOTE — Plan of Care (Signed)

## 2019-12-06 LAB — GLUCOSE, CAPILLARY: Glucose-Capillary: 117 mg/dL — ABNORMAL HIGH (ref 70–99)

## 2019-12-06 MED ORDER — ISOSORBIDE DINITRATE 10 MG PO TABS
10.0000 mg | ORAL_TABLET | Freq: Three times a day (TID) | ORAL | 0 refills | Status: DC
Start: 2019-12-06 — End: 2020-01-03

## 2019-12-06 MED ORDER — CLOPIDOGREL BISULFATE 75 MG PO TABS
75.0000 mg | ORAL_TABLET | Freq: Every day | ORAL | 1 refills | Status: DC
Start: 2019-12-06 — End: 2020-01-17

## 2019-12-06 MED ORDER — METOPROLOL TARTRATE 25 MG PO TABS
25.0000 mg | ORAL_TABLET | Freq: Two times a day (BID) | ORAL | 1 refills | Status: DC
Start: 2019-12-06 — End: 2020-01-03

## 2019-12-06 MED ORDER — TRAMADOL HCL 50 MG PO TABS: 50.0000 mg | ORAL_TABLET | Freq: Four times a day (QID) | ORAL | 0 refills | Status: AC | PRN

## 2019-12-06 MED ORDER — AMIODARONE HCL 200 MG PO TABS
200.0000 mg | ORAL_TABLET | Freq: Two times a day (BID) | ORAL | 1 refills | Status: DC
Start: 2019-12-06 — End: 2020-01-03

## 2019-12-06 NOTE — TOC Transition Note (Signed)
Transition of Care [facility]) - CM/SW Discharge Note   Patient Details  Name: Jamie Hodge MRN: [id number] Date of Birth: 05/05/1940  Transition of Care [facility]) CM/SW Contact:  Zenon Mayo, RN Phone Number: 12/06/2019, 9:00 AM   Clinical Narrative:    Patient is for dc today, she states she is indep and does not need [place] services or any DME.  Her spouse will be transporting her home today.    Final next level of care: Home/Self Care Barriers to Discharge: No Barriers Identified   Patient Goals and CMS Choice Patient states their goals for this hospitalization and ongoing recovery are:: get better   Choice offered to / list presented to : NA  Discharge Placement                       Discharge Plan and Services                  DME Agency: NA       HH Arranged: NA          Social Determinants of Health (SDOH) Interventions     Readmission Risk Interventions No flowsheet data found.

## 2019-12-06 NOTE — Progress Notes (Signed)
CARDIAC REHAB PHASE I   D/c education completed with pt. Pt educated on importance of site care and monitoring incision daily. Encouraged continued IS use, walks, and sternal precautions. Pt given in-the-tube sheet along with heart healthy diet. Reviewed restrictions and exercise guidelines. Will refer to CRP II [place].  [id number] Rufina Falco, RN BSN 12/06/2019 8:41 AM

## 2019-12-06 NOTE — Progress Notes (Signed)
PT Cancellation Note  Patient Details Name: Jamie Hodge MRN: [id number] DOB: 1940-12-24   Cancelled Treatment:    Reason Eval/Treat Not Completed: Other (comment). Pt received sitting up in chair. Pt declined ambulation stating "I am leaving soon and want to safe my energy for that." Discussed ambulation program, sternal precautions, and stair navigation. Pt states she has 2 STE without handrail. Pt educated on step to pattern, leading with strong leg up and weaker leg down. Pt with verbal understanding and reports she has a walking plan at home and different loops planned out in her head. PT to return as able if needed/requested by MD or patient, however aware pt with d/c order.  Kittie Plater, PT, DPT Acute Rehabilitation Services Pager #: [phone] Office #: [phone]    Berline Lopes 12/06/2019, 8:58 AM

## 2019-12-06 NOTE — Progress Notes (Signed)
Occupational Therapy Treatment and Discharge Patient Details Name: Jamie Hodge MRN: [id number] DOB: 1941/01/16 Today's Date: 12/06/2019    History of present illness Pt is a 79 year old woman admitted for CABG x 6. Hospital course complicated by difficulty weaning and nausea. 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   OT comments  Pt educated in energy conservation and given written handout. Pt is modified independent in ADL and ADL transfers. No further OT needs.  Follow Up Recommendations  No OT follow up    Equipment Recommendations  None recommended by OT    Recommendations for Other Services      Precautions / Restrictions Precautions Precautions: Sternal Precaution Booklet Issued: Yes (comment) Restrictions Weight Bearing Restrictions: Yes (sternal precautions)       Mobility Bed Mobility               General bed mobility comments: pt walking about her room  Transfers Overall transfer level: Modified independent Equipment used: None                  Balance                                           ADL either performed or assessed with clinical judgement   ADL Overall ADL's : Modified independent                                       General ADL Comments: Educated in energy conservation strategies and provided written handout to reinforce.     Vision       Perception     Praxis      Cognition Arousal/Alertness: Awake/alert Behavior During Therapy: WFL for tasks assessed/performed Overall Cognitive Status: Within Functional Limits for tasks assessed                                          Exercises     Shoulder Instructions       General Comments      Pertinent Vitals/ Pain       Pain Assessment: Faces Faces Pain Scale: Hurts a little bit Pain Location: chest incision Pain Descriptors / Indicators: Sore Pain Intervention(s): Monitored during  session;Repositioned  Home Living                                          Prior Functioning/Environment              Frequency  Min 2X/week        Progress Toward Goals  OT Goals(current goals can now be found in the care plan section)  Progress towards OT goals: Goals met/education completed, patient discharged from OT  Acute Rehab OT Goals Patient Stated Goal: return home  Plan All goals met and education completed, patient discharged from OT services    Co-evaluation                 AM-PAC OT "6 Clicks" Daily Activity     Outcome Measure   Help from another person eating meals?: None Help from another person  taking care of personal grooming?: None Help from another person toileting, which includes using toliet, bedpan, or urinal?: None Help from another person bathing (including washing, rinsing, drying)?: None Help from another person to put on and taking off regular upper body clothing?: None Help from another person to put on and taking off regular lower body clothing?: None 6 Click Score: 24    End of Session    OT Visit Diagnosis: Pain   Activity Tolerance Patient tolerated treatment well   Patient Left in chair;with call bell/phone within reach   Nurse Communication          Time: 7357-8978 OT Time Calculation (min): 17 min  Charges: OT General Charges $OT Visit: 1 Visit OT Treatments $Self Care/Home Management : 8-22 mins  Nestor Lewandowsky, OTR/L Acute Rehabilitation Services Pager: [phone] Office: [phone]  Malka So 12/06/2019, 9:56 AM

## 2019-12-06 NOTE — Progress Notes (Signed)
[address]       [place] [address]             [phone]      7 Days Post-Op Procedure(s) (LRB): CORONARY ARTERY BYPASS GRAFTING (CABG) x 6 ON PUMP. USING LEFT INTERNAL MAMMARY ARTERY, LEFT RADIAL ARTERY AND RIGHT GREATER SAPHENOUS VEIN. (N/A) RADIAL ARTERY HARVEST (Left) TRANSESOPHAGEAL ECHOCARDIOGRAM (TEE) (N/A) INDOCYANINE GREEN FLUORESCENCE IMAGING (ICG) (N/A) ENDOVEIN HARVEST OF GREATER SAPHENOUS VEIN (Right) Subjective: Feels very well  Objective: Vital signs in last 24 hours: Temp:  [98 F (36.7 C)-98.5 F (36.9 C)] 98.1 F (36.7 C) (11/15 0317) Pulse Rate:  [79-89] 80 (11/15 0317) Cardiac Rhythm: Normal sinus rhythm (11/15 0413) Resp:  [18-23] 18 (11/15 0317) BP: (102-138)/(58-97) 122/58 (11/15 0317) SpO2:  [98 %-99 %] 98 % (11/15 0317) Weight:  [85.9 kg] 85.9 kg (11/15 0307)  Hemodynamic parameters for last 24 hours:    Intake/Output from previous day: 11/14 0701 - 11/15 0700 In: 720 [P.O.:720] Out: -  Intake/Output this shift: No intake/output data recorded.  General appearance: alert, cooperative and no distress Heart: regular rate and rhythm Lungs: clear to auscultation bilaterally Abdomen: benign Extremities: min edema Wound: incis healing well  Lab Results: Recent Labs    12/04/19 0101  WBC 14.3*  HGB 10.7*  HCT 32.6*  PLT 260   BMET:  Recent Labs    12/04/19 0101 12/05/19 0039  NA 137 135  K 3.3* 3.9  CL 94* 96*  CO2 34* 31  GLUCOSE 108* 124*  BUN 14 17  CREATININE 0.85 0.76  CALCIUM 8.4* 8.7*    PT/INR: No results for input(s): LABPROT, INR in the last 72 hours. ABG    Component Value Date/Time   PHART 7.315 (L) 11/30/2019 0545   HCO3 27.0 11/30/2019 0545   TCO2 29 11/30/2019 0545   ACIDBASEDEF 1.0 11/29/2019 2214   O2SAT 64.4 12/03/2019 0503   CBG (last 3)  Recent Labs    12/05/19 1622 12/05/19 1928 12/05/19 2316  GLUCAP 114* 130* 125*    Meds Scheduled Meds: . amiodarone  400 mg  Oral BID  . aspirin EC  81 mg Oral Daily  . atorvastatin  80 mg Oral Daily  . bisacodyl  10 mg Oral Daily   Or  . bisacodyl  10 mg Rectal Daily  . Chlorhexidine Gluconate Cloth  6 each Topical Daily  . clopidogrel  75 mg Oral Daily  . docusate sodium  200 mg Oral Daily  . furosemide  40 mg Oral Daily  . insulin aspart  0-24 Units Subcutaneous Q4H  . isosorbide dinitrate  10 mg Oral TID  . mouth rinse  15 mL Mouth Rinse BID  . metoprolol tartrate  25 mg Oral BID   Or  . metoprolol tartrate  12.5 mg Per Tube BID  . pantoprazole  40 mg Oral Daily   Continuous Infusions: . lactated ringers     PRN Meds:.lactated ringers, levalbuterol, metoprolol tartrate, ondansetron (ZOFRAN) IV, oxyCODONE, phenol, sertraline, traMADol  Xrays No results found.  Assessment/Plan: S/P Procedure(s) (LRB): CORONARY ARTERY BYPASS GRAFTING (CABG) x 6 ON PUMP. USING LEFT INTERNAL MAMMARY ARTERY, LEFT RADIAL ARTERY AND RIGHT GREATER SAPHENOUS VEIN. (N/A) RADIAL ARTERY HARVEST (Left) TRANSESOPHAGEAL ECHOCARDIOGRAM (TEE) (N/A) INDOCYANINE GREEN FLUORESCENCE IMAGING (ICG) (N/A) ENDOVEIN HARVEST OF GREATER SAPHENOUS VEIN (Right)   1 afeb, VSS 2 sats good on RA 3 no new labs, BS ok control 4 weight below preop 5 stable for D/C  LOS: 7 days    John Giovanni PA-C Pager [phone] 12/06/2019

## 2019-12-07 ENCOUNTER — Other Ambulatory Visit: Payer: Self-pay | Admitting: Cardiothoracic Surgery

## 2019-12-07 DIAGNOSIS — I4891 Unspecified atrial fibrillation: Secondary | ICD-10-CM

## 2019-12-07 DIAGNOSIS — Z951 Presence of aortocoronary bypass graft: Secondary | ICD-10-CM

## 2019-12-08 ENCOUNTER — Ambulatory Visit (INDEPENDENT_AMBULATORY_CARE_PROVIDER_SITE_OTHER): Payer: Self-pay | Admitting: *Deleted

## 2019-12-08 ENCOUNTER — Other Ambulatory Visit: Payer: Self-pay

## 2019-12-08 VITALS — BP 119/75 | HR 89

## 2019-12-08 DIAGNOSIS — Z4802 Encounter for removal of sutures: Secondary | ICD-10-CM

## 2019-12-08 NOTE — Progress Notes (Signed)
Patient arrived for nurse visit to remove sutures post-CABG 12/08/19 by Dr. Orvan Seen.  Three chest tube sutures removed with no signs or symptoms of infection noted.  Steri strips placed over three incisions r/t partial opening of skin post suture removal. Patient tolerated suture removal well.  Sternal incision and left radial artery harest site clean, skin intact with staples.  Patient instructed to keep the incision site clean and dry. Patient acknowledged instructions given.  Patients v/s obtained r/t pt stating she felt dizzy.  V/S WNL.  Pt with concerns over swollen ankle and feet. Instructed pt to keep legs and feet elevated when in the sitting position. Pt to f/u with [facility] Monday, November 22. All questions answered at this time.

## 2019-12-09 DIAGNOSIS — Z48812 Encounter for surgical aftercare following surgery on the circulatory system: Secondary | ICD-10-CM | POA: Diagnosis not present

## 2019-12-10 ENCOUNTER — Telehealth (HOSPITAL_COMMUNITY): Payer: Self-pay

## 2019-12-10 NOTE — Telephone Encounter (Signed)
Faxed cardiac rehab referral to [facility] cardiac rehab per phase I.

## 2019-12-13 ENCOUNTER — Other Ambulatory Visit: Payer: Self-pay | Admitting: *Deleted

## 2019-12-13 ENCOUNTER — Ambulatory Visit (INDEPENDENT_AMBULATORY_CARE_PROVIDER_SITE_OTHER): Payer: Self-pay | Admitting: Physician Assistant

## 2019-12-13 ENCOUNTER — Other Ambulatory Visit: Payer: Self-pay

## 2019-12-13 ENCOUNTER — Ambulatory Visit
Admission: RE | Admit: 2019-12-13 | Discharge: 2019-12-13 | Disposition: A | Discharge status: Home / Self Care | Payer: Medicare Other | Source: Ambulatory Visit | Attending: Cardiothoracic Surgery | Admitting: Cardiothoracic Surgery

## 2019-12-13 VITALS — BP 153/85 | HR 79 | Temp 97.7°F | Resp 20 | Ht 64.0 in | Wt 186.6 lb

## 2019-12-13 DIAGNOSIS — Z951 Presence of aortocoronary bypass graft: Secondary | ICD-10-CM

## 2019-12-13 MED ORDER — POTASSIUM CHLORIDE CRYS ER 10 MEQ PO TBCR
20.0000 meq | EXTENDED_RELEASE_TABLET | Freq: Every day | ORAL | Status: DC
Start: 2019-12-13 — End: 2019-12-13

## 2019-12-13 MED ORDER — POTASSIUM CHLORIDE CRYS ER 20 MEQ PO TBCR: 20.0000 meq | EXTENDED_RELEASE_TABLET | Freq: Every day | ORAL | 0 refills | Status: DC

## 2019-12-13 MED ORDER — FUROSEMIDE 40 MG PO TABS: 40.0000 mg | ORAL_TABLET | Freq: Every day | ORAL | 0 refills | Status: DC

## 2019-12-13 MED ORDER — FUROSEMIDE 20 MG PO TABS
40.0000 mg | ORAL_TABLET | Freq: Every day | ORAL | Status: DC
Start: 2019-12-13 — End: 2019-12-13

## 2019-12-13 NOTE — Progress Notes (Deleted)
Cardiology Office Note:    Date:  12/13/2019   ID:  Jamie Hodge, DOB 1940-10-20, MRN [id number]  PCP:  [facility] [place]  Cardiologist:  Elouise Munroe, MD  Electrophysiologist:  None   Referring MD: [facility]*   Chief Complaint: ***  History of Present Illness:    Jamie Hodge is a 79 y.o. female with a history of CAD s/p recent CABG *** and CVA who is followed by Dr. Margaretann Loveless and presents today for follow-up.  Patient was initially seen by Dr. Margaretann Loveless during an admission for small subacute left cerebellar infarct for abnormal Echo which showed LVEF of 65-70% with severe akinesis of apical segment without apical thrombus. Outpatient coronary CTA was ordered for further evaluation and showed severe CAD in proximal LAD, ostial first diagonal, and proximal ramus intermedius. Therefore, cardiac catheterization was ordered. Cath on 11/23/2019 showed multivessel complex calcific CAD with occluded LAD with collaterals. CT surgery was consulted and patient was brought back for CABG on 11/29/2019. Patient tolerated procedure well. She did fail initial weaning attempts on 2 occasions. This was initially felt to be related to suboptimal pain control. She pain controlled improved, she was able to be extubated and post extubation she had no respiratory distress. Post-op course significant for post-op atrial fibrillation (converted back to sinus rhythm on IV Amiodarone), volume overloaded treated with diuretics, and stable acute blood loss anemia. She was discharged on Amiodarone 200mg  twice daily, Aspirin 81mg  daily, Plavix 75mg  daily, Isordil 10mg  three times daily, Lopressor 25mg  twice daily, and Lipitor 80mg  daily.  Past Medical History:  Diagnosis Date  . Coronary artery disease   . CVA (cerebral vascular accident) ([place])   . Herpes zoster 2012  . Vertigo     Past Surgical History:  Procedure Laterality Date  . CARDIAC CATHETERIZATION    . CORONARY  ARTERY BYPASS GRAFT N/A 11/29/2019   Procedure: CORONARY ARTERY BYPASS GRAFTING (CABG) x 6 ON PUMP. USING LEFT INTERNAL MAMMARY ARTERY, LEFT RADIAL ARTERY AND RIGHT GREATER SAPHENOUS VEIN.;  Surgeon: Wonda Olds, MD;  Location: [facility];  Service: Open Heart Surgery;  Laterality: N/A;  . ENDOVEIN HARVEST OF GREATER SAPHENOUS VEIN Right 11/29/2019   Procedure: ENDOVEIN HARVEST OF GREATER SAPHENOUS VEIN;  Surgeon: Wonda Olds, MD;  Location: [facility];  Service: Open Heart Surgery;  Laterality: Right;  . LEFT HEART CATH AND CORONARY ANGIOGRAPHY N/A 11/23/2019   Procedure: LEFT HEART CATH AND CORONARY ANGIOGRAPHY;  Surgeon: Jettie Booze, MD;  Location: [facility];  Service: Cardiovascular;  Laterality: N/A;  . RADIAL ARTERY HARVEST Left 11/29/2019   Procedure: RADIAL ARTERY HARVEST;  Surgeon: Wonda Olds, MD;  Location: [place];  Service: Open Heart Surgery;  Laterality: Left;  . RHINOPLASTY    . TEE WITHOUT CARDIOVERSION N/A 11/29/2019   Procedure: TRANSESOPHAGEAL ECHOCARDIOGRAM (TEE);  Surgeon: Wonda Olds, MD;  Location: [facility];  Service: Open Heart Surgery;  Laterality: N/A;    Current Medications: No outpatient medications have been marked as taking for the 12/22/19 encounter (Appointment) with Darreld Mclean, PA-C.     Allergies:   Mango flavor and Contrast media [iodinated diagnostic agents]   Social History   Socioeconomic History  . Marital status: Married    Spouse name: Not on file  . Number of children: Not on file  . Years of education: Not on file  . Highest education level: Not on file  Occupational History  .  Not on file  Tobacco Use  . Smoking status: Never Smoker  . Smokeless tobacco: Never Used  Vaping Use  . Vaping Use: Never used  Substance and Sexual Activity  . Alcohol use: No  . Drug use: No  . Sexual activity: Never    Birth control/protection: None  Other Topics Concern  . Not on file  Social History Narrative  . Not on file    Social Determinants of Health   Financial Resource Strain:   . Difficulty of Paying Living Expenses: Not on file  Food Insecurity:   . Worried About Charity fundraiser in the Last Year: Not on file  . Ran Out of Food in the Last Year: Not on file  Transportation Needs: No Transportation Needs  . Lack of Transportation (Medical): No  . Lack of Transportation (Non-Medical): No  Physical Activity:   . Days of Exercise per Week: Not on file  . Minutes of Exercise per Session: Not on file  Stress:   . Feeling of Stress : Not on file  Social Connections:   . Frequency of Communication with Friends and Family: Not on file  . Frequency of Social Gatherings with Friends and Family: Not on file  . Attends Religious Services: Not on file  . Active Member of Clubs or Organizations: Not on file  . Attends Archivist Meetings: Not on file  . Marital Status: Not on file     Family History: The patient's ***family history includes CAD (age of onset: 19) in her father; Lung cancer (age of onset: 46) in her father; Unexplained death in her mother.  ROS:   Please see the history of present illness.    *** All other systems reviewed and are negative.  EKGs/Labs/Other Studies Reviewed:    The following studies were reviewed today:  Coronary CTA 11/08/2019: Impression: 1. Severe CAD in proximal LAD, ostial first diagonal, and proximal ramus intermedius, CADRADS = 5 due to total occlusion of first diagonal branch with distal recanalization. CT FFR will be performed and reported separately. 2. Coronary calcium score is 1054, which places the patient in the 93rd percentile for age and sex matched control. 3. 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 No apical or mural thrombus.  CAD-RADS/Recommendations: CAD-RADS 5 Total coronary occlusion (100%). Consider cardiac catheterization or viability assessment. Consider symptom-guided anti-ischemic pharmacotherapy as well as  risk factor modification per guideline directed care. _______________  Left Cardiac Catheterization 11/23/2019:  Ost LAD to Prox LAD lesion is 90% stenosed.  Mid RCA lesion is 90% stenosed. 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  Mid RCA to Dist RCA lesion is 25% stenosed.  RPDA lesion is 40% stenosed.  Dist LAD lesion is 100% stenosed. Right to left collaterals present.  Ramus-1 lesion is 90% stenosed.  Ramus-2 lesion is 75% stenosed.  Ost Cx lesion is 75% stenosed.  Dist LM lesion is 25% stenosed.  Mid Cx lesion is 95% stenosed.  Mid LAD lesion is 100% stenosed. Left to left collaterals. Large parallel septal vessel.  The left ventricular ejection fraction is 45-50% by visual estimate. LVEDP 11 mm Hg.  There is no aortic valve stenosis.   Multivessel complex calcific CAD.  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  Will get CVTS consult.  _______________  Limited Echo 12/02/2019: Impressions: 1. Left ventricular ejection fraction, by estimation, is 55%. The left  ventricle has normal function. The left ventricle demonstrates regional  wall  motion abnormalities with apical inferior and apical septal  aneurysmal segments. Left ventricular  diastolic parameters are consistent with Grade I diastolic dysfunction  (impaired relaxation). No thrombus noted.  2. The mitral valve is normal in structure. No evidence of mitral valve  regurgitation. No evidence of mitral stenosis.  3. The aortic valve is tricuspid. Aortic valve regurgitation is not  visualized. No aortic stenosis is present.  4. Right ventricular systolic function is normal. The right ventricular  size is normal. Tricuspid regurgitation signal is inadequate for assessing  PA pressure.  5. No free-flowing pericardial effusion noted, but there does appear to  be some thickening of the pericardium (?hematoma) around the RV. There is  respirophasic  variation in the position of the interventricular septum,  suggesting pericardial disease.  6. The IVC was not visualized.    EKG:  EKG ordered today. EKG personally reviewed and demonstrates ***.  Recent Labs: 11/29/2019: ALT 32 11/30/2019: Magnesium 2.7 12/04/2019: Hemoglobin 10.7; Platelets 260 12/05/2019: BUN 17; Creatinine, Ser 0.76; Potassium 3.9; Sodium 135  Recent Lipid Panel    Component Value Date/Time   CHOL 62 11/30/2019 0459   TRIG 68 11/30/2019 0459   HDL 20 (L) 11/30/2019 0459   CHOLHDL 3.1 11/30/2019 0459   VLDL 14 11/30/2019 0459   LDLCALC 28 11/30/2019 0459    Physical Exam:    Vital Signs: There were no vitals taken for this visit.    Wt Readings from Last 3 Encounters:  12/13/19 186 lb 9.6 oz (84.6 kg)  12/06/19 189 lb 6 oz (85.9 kg)  11/25/19 184 lb 14.4 oz (83.9 kg)     General: 79 y.o. female in no acute distress. HEENT: Normocephalic and atraumatic. Sclera clear. EOMs intact. Neck: Supple. No carotid bruits. No JVD. Heart: *** RRR. Distinct S1 and S2. No murmurs, gallops, or rubs. Radial and distal pedal pulses 2+ and equal bilaterally. Lungs: No increased work of breathing. Clear to ausculation bilaterally. No wheezes, rhonchi, or rales.  Abdomen: Soft, non-distended, and non-tender to palpation. Bowel sounds present in all 4 quadrants.  MSK: Normal strength and tone for age. *** Extremities: No lower extremity edema.    Skin: Warm and dry. Neuro: Alert and oriented x3. No focal deficits. Psych: Normal affect. Responds appropriately.   Assessment:    No diagnosis found.  Plan:     Disposition: Follow up in ***   Medication Adjustments/Labs and Tests Ordered: Current medicines are reviewed at length with the patient today.  Concerns regarding medicines are outlined above.  No orders of the defined types were placed in this encounter.  No orders of the defined types were placed in this encounter.   There are no Patient Instructions  on file for this visit.   Signed, Darreld Mclean, PA-C  12/13/2019 7:17 PM    [facility]

## 2019-12-13 NOTE — Progress Notes (Signed)
[address]       [place] [address]             [phone]      Jamie Hodge is a 79 y.o. female patient who is s/p CABG who returned to the office for her routine 2-week follow-up  her only complaint today is shortness of breath with activity and shortness of breath when lying down flat.    1. S/P CABG x 6    Past Medical History:  Diagnosis Date  . Coronary artery disease   . CVA (cerebral vascular accident) ([place])   . Herpes zoster 2012  . Vertigo    No past surgical history pertinent negatives on file. Scheduled Meds: Current Outpatient Medications on File Prior to Visit  Medication Sig Dispense Refill  . amiodarone (PACERONE) 200 MG tablet Take 1 tablet (200 mg total) by mouth 2 (two) times daily. 60 tablet 1  . aspirin EC 81 MG tablet Take 81 mg by mouth daily. Swallow whole.    Marland Kitchen atorvastatin (LIPITOR) 80 MG tablet Take 1 tablet (80 mg total) by mouth daily. 90 tablet 3  . clopidogrel (PLAVIX) 75 MG tablet Take 1 tablet (75 mg total) by mouth daily. 30 tablet 1  . isosorbide dinitrate (ISORDIL) 10 MG tablet Take 1 tablet (10 mg total) by mouth 3 (three) times daily. 90 tablet 0  . metoprolol tartrate (LOPRESSOR) 25 MG tablet Take 1 tablet (25 mg total) by mouth 2 (two) times daily. 60 tablet 1  . Multiple Vitamin (MULTIVITAMIN ADULT PO) Take 1 tablet by mouth daily.    . Multiple Vitamins-Minerals (PRESERVISION AREDS 2) CAPS Take 2 capsules by mouth daily.    . sertraline (ZOLOFT) 25 MG tablet Take 1 tablet (25 mg total) by mouth daily. (Patient taking differently: Take 25 mg by mouth daily as needed (anxiety). ) 90 tablet 3  . traMADol (ULTRAM) 50 MG tablet Take 1 tablet (50 mg total) by mouth every 6 (six) hours as needed for up to 7 days for moderate pain. 28 tablet 0  . triamcinolone (NASACORT ALLERGY 24HR) 55 MCG/ACT AERO nasal inhaler Place 1 spray into the nose daily as needed (allergies).     No current facility-administered  medications on file prior to visit.    Allergies  Allergen Reactions  . Mango Flavor Anaphylaxis  . Contrast Media [Iodinated Diagnostic Agents] Other (See Comments)    Unknown reaction   Active Problems:   * No active hospital problems. *  Blood pressure (!) 153/85, pulse 79, temperature 97.7 F (36.5 C), temperature source Skin, resp. rate 20, height 5\' 4"  (1.626 m), weight 186 lb 9.6 oz (84.6 kg), SpO2 93 %.  Subjective She returns with shortness of breath.   Objective   CV-NSR, no murmur Pulm-diminished breath sounds in both lower lobes Abd: no tenderness Ext: 2+ pitting edema in ankles and feet Wound: clean and dry, staples removed.   CLINICAL DATA:  Status post coronary artery bypass grafting  EXAM: CHEST - 2 VIEW  COMPARISON:  December 04, 2019  FINDINGS: There is a moderate pleural effusion on the left with atelectatic change and suspected consolidation left base. There is slight right base atelectasis. Lungs elsewhere are clear. No pneumothorax. Heart size and pulmonary vascularity normal. No adenopathy. Patient is status post median sternotomy. No bone lesions.  IMPRESSION: Left pleural effusion with left base atelectasis and questionable superimposed left base pneumonia. Slight right base atelectasis.  Heart size  within normal limits. Postoperative changes noted. No evident adenopathy.   Electronically Signed   By: Lowella Grip III M.D.   On: 12/13/2019 14:10   Assessment & Plan   Chest x-ray was reviewed with the patient today during our visit.  She is short of breath with ambulation and when laying flat at night.  This has been affecting her sleep and she has had her sleep in her recliner.  She has had limited pain with her sternotomy incision and her radial artery harvest site.  All her staples removed from her sternotomy and her radial artery incision.  Steri-Strips were placed.  She also had 2+ pitting edema in her lower extremity.  I  have prescribed her Lasix 40 mg daily with potassium supplementation for 5 days.  She is to return the office next week with a follow-up chest x-ray to evaluate her moderate left pleural effusion.  Time.  Patient has not improved she may need a thoracentesis.   Follow-up in 1 week with CXR.   Elgie Collard 12/13/2019

## 2019-12-14 ENCOUNTER — Other Ambulatory Visit: Payer: Self-pay | Admitting: Cardiothoracic Surgery

## 2019-12-14 DIAGNOSIS — Z951 Presence of aortocoronary bypass graft: Secondary | ICD-10-CM

## 2019-12-20 ENCOUNTER — Ambulatory Visit: Payer: Medicare Other | Admitting: Cardiothoracic Surgery

## 2019-12-20 ENCOUNTER — Other Ambulatory Visit: Payer: Self-pay

## 2019-12-20 ENCOUNTER — Ambulatory Visit
Admission: RE | Admit: 2019-12-20 | Discharge: 2019-12-20 | Disposition: A | Discharge status: Home / Self Care | Payer: Medicare Other | Source: Ambulatory Visit | Attending: Cardiothoracic Surgery | Admitting: Cardiothoracic Surgery

## 2019-12-20 ENCOUNTER — Ambulatory Visit (INDEPENDENT_AMBULATORY_CARE_PROVIDER_SITE_OTHER): Payer: Self-pay | Admitting: Cardiothoracic Surgery

## 2019-12-20 VITALS — BP 144/81 | HR 95 | Resp 18 | Ht 64.0 in | Wt 178.0 lb

## 2019-12-20 DIAGNOSIS — Z951 Presence of aortocoronary bypass graft: Secondary | ICD-10-CM

## 2019-12-21 NOTE — Progress Notes (Signed)
[address]       [place] [address]             [place] OFFICE NOTE  Referring Provider is Elouise Munroe, MD Primary Cardiologist is Elouise Munroe, MD PCP is [facility], [place]   HPI:  79 yo lady is s/p CABG x 6 on 11/29/19 with 3 arterial distal anastomoses. She did well after surgery and was treated for brief afib episodes with amiodarone. She has been on isordil for radial graft. She follows up now for left pleural effusion. She has no complaints of CP or SOB/DOE.   Current Outpatient Medications  Medication Sig Dispense Refill  . amiodarone (PACERONE) 200 MG tablet Take 1 tablet (200 mg total) by mouth 2 (two) times daily. 60 tablet 1  . aspirin EC 81 MG tablet Take 81 mg by mouth daily. Swallow whole.    Marland Kitchen atorvastatin (LIPITOR) 80 MG tablet Take 1 tablet (80 mg total) by mouth daily. 90 tablet 3  . clopidogrel (PLAVIX) 75 MG tablet Take 1 tablet (75 mg total) by mouth daily. 30 tablet 1  . isosorbide dinitrate (ISORDIL) 10 MG tablet Take 1 tablet (10 mg total) by mouth 3 (three) times daily. 90 tablet 0  . metoprolol tartrate (LOPRESSOR) 25 MG tablet Take 1 tablet (25 mg total) by mouth 2 (two) times daily. 60 tablet 1  . Multiple Vitamin (MULTIVITAMIN ADULT PO) Take 1 tablet by mouth daily.    . Multiple Vitamins-Minerals (PRESERVISION AREDS 2) CAPS Take 2 capsules by mouth daily.    . sertraline (ZOLOFT) 25 MG tablet Take 1 tablet (25 mg total) by mouth daily. (Patient taking differently: Take 25 mg by mouth daily as needed (anxiety). ) 90 tablet 3  . triamcinolone (NASACORT ALLERGY 24HR) 55 MCG/ACT AERO nasal inhaler Place 1 spray into the nose daily as needed (allergies).     No current facility-administered medications for this visit.      Physical Exam:   BP (!) 144/81 (BP Location: Right Arm, Patient Position: Sitting)   Pulse 95   Resp 18   Ht 5\' 4"  (1.626 m)   Wt 80.7 kg   SpO2  91% Comment: RA with mask on  BMI 30.55 kg/m   General:  Well-appearing, NAD  Chest:   Decreased BS left base  CV:   rrr  Incisions:  Healing well  Abdomen:  sntnd  Extremities:  No edema Diagnostic Tests: Small left effusion on chest xray today  Impression:  Doing well after cabg  Plan:  F/u with thoracic surgery as needed F/u with cardiology as scheduled Stop amiodarone Stop isordil Would continue plavix indefinitely Encourage wearing a soft sports bra for sternal support  I spent in excess of 20 minutes during the conduct of this office consultation and >50% of this time involved direct face-to-face encounter with the patient for counseling and/or coordination of their care.  Level 2                 10 minutes Level 3                 15 minutes Level 4                 25 minutes Level 5                 40 minutes  B. Murvin Natal, MD 12/21/2019  10:15 AM

## 2019-12-22 ENCOUNTER — Ambulatory Visit: Payer: Medicare Other | Admitting: Student

## 2020-01-03 ENCOUNTER — Ambulatory Visit: Payer: Medicare Other | Admitting: Internal Medicine

## 2020-01-03 ENCOUNTER — Other Ambulatory Visit: Payer: Self-pay

## 2020-01-03 ENCOUNTER — Encounter: Payer: Self-pay | Admitting: Internal Medicine

## 2020-01-03 VITALS — BP 160/82 | HR 95 | Ht 64.0 in | Wt 182.4 lb

## 2020-01-03 DIAGNOSIS — I253 Aneurysm of heart: Secondary | ICD-10-CM | POA: Diagnosis not present

## 2020-01-03 DIAGNOSIS — I63342 Cerebral infarction due to thrombosis of left cerebellar artery: Secondary | ICD-10-CM

## 2020-01-03 DIAGNOSIS — I2510 Atherosclerotic heart disease of native coronary artery without angina pectoris: Secondary | ICD-10-CM | POA: Diagnosis not present

## 2020-01-03 DIAGNOSIS — Z79899 Other long term (current) drug therapy: Secondary | ICD-10-CM

## 2020-01-03 DIAGNOSIS — I10 Essential (primary) hypertension: Secondary | ICD-10-CM

## 2020-01-03 DIAGNOSIS — Z951 Presence of aortocoronary bypass graft: Secondary | ICD-10-CM

## 2020-01-03 DIAGNOSIS — E785 Hyperlipidemia, unspecified: Secondary | ICD-10-CM

## 2020-01-03 MED ORDER — FUROSEMIDE 40 MG PO TABS: 40.0000 mg | ORAL_TABLET | Freq: Every day | ORAL | 0 refills | Status: DC

## 2020-01-03 MED ORDER — CARVEDILOL 12.5 MG PO TABS: 12.5000 mg | ORAL_TABLET | Freq: Two times a day (BID) | ORAL | 3 refills | Status: DC

## 2020-01-03 MED ORDER — POTASSIUM CHLORIDE ER 20 MEQ PO TBCR: 20.0000 meq | EXTENDED_RELEASE_TABLET | Freq: Every day | ORAL | 0 refills | Status: DC

## 2020-01-03 NOTE — Progress Notes (Signed)
Cardiology Office Note:    Date:  01/03/2020   ID:  Jamie Hodge, DOB 09-Nov-1940, MRN [id number]  PCP:  [facility], [place]  Cardiologist:  Elouise Munroe, MD  Electrophysiologist:  None   Referring MD: [facility]*    Chief Complaint/Reason for Referral: CAD s/p CABG  History of Present Illness:    Jamie Hodge is a 79 y.o. 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 CCTA showed severe CAD, and after cath she was referred for CABG for multivessel CAD. She underwent CABG on 11/29/19.   She was seen by Dr. Orvan Seen with [facility] 2 weeks ago, felt to be doing well. Noted that she had postoperative afib, treated with amiodarone, and radial artery graft managed with isordil. Amiodarone recently stopped, as was isordil. Plavix recommended indefinitely.   She was on lasix for left pleural effusion and postoperative volume management. She had an episode of hypotension where bp dropped and did not feel well in shower. Otherwise she feels good and feels she has "turned a corner."  The patient denies chest pain, chest pressure, dyspnea at rest or with exertion, palpitations, PND, orthopnea, or leg swelling. Denies cough, fever, chills. Denies nausea, vomiting. Denies syncope.   Past Medical History:  Diagnosis Date  . Coronary artery disease   . CVA (cerebral vascular accident) ([facility])   . Herpes zoster 2012  . Vertigo     Past Surgical History:  Procedure Laterality Date  . CARDIAC CATHETERIZATION    . CORONARY ARTERY BYPASS GRAFT N/A 11/29/2019   Procedure: CORONARY ARTERY BYPASS GRAFTING (CABG) x 6 ON PUMP. USING LEFT INTERNAL MAMMARY ARTERY, LEFT RADIAL ARTERY AND RIGHT GREATER SAPHENOUS VEIN.;  Surgeon: Wonda Olds, MD;  Location: [facility];  Service: Open Heart Surgery;  Laterality: N/A;  . ENDOVEIN HARVEST OF GREATER SAPHENOUS VEIN Right 11/29/2019   Procedure: ENDOVEIN HARVEST OF  GREATER SAPHENOUS VEIN;  Surgeon: Wonda Olds, MD;  Location: [facility];  Service: Open Heart Surgery;  Laterality: Right;  . LEFT HEART CATH AND CORONARY ANGIOGRAPHY N/A 11/23/2019   Procedure: LEFT HEART CATH AND CORONARY ANGIOGRAPHY;  Surgeon: Jettie Booze, MD;  Location: [facility];  Service: Cardiovascular;  Laterality: N/A;  . RADIAL ARTERY HARVEST Left 11/29/2019   Procedure: RADIAL ARTERY HARVEST;  Surgeon: Wonda Olds, MD;  Location: [facility];  Service: Open Heart Surgery;  Laterality: Left;  . RHINOPLASTY    . TEE WITHOUT CARDIOVERSION N/A 11/29/2019   Procedure: TRANSESOPHAGEAL ECHOCARDIOGRAM (TEE);  Surgeon: Wonda Olds, MD;  Location: [facility];  Service: Open Heart Surgery;  Laterality: N/A;    Current Medications: Current Meds  Medication Sig  . aspirin EC 81 MG tablet Take 81 mg by mouth daily. Swallow whole.  Marland Kitchen atorvastatin (LIPITOR) 80 MG tablet Take 1 tablet (80 mg total) by mouth daily.  . clopidogrel (PLAVIX) 75 MG tablet Take 1 tablet (75 mg total) by mouth daily.  . metoprolol tartrate (LOPRESSOR) 25 MG tablet Take 1 tablet (25 mg total) by mouth 2 (two) times daily.  . Multiple Vitamin (MULTIVITAMIN ADULT PO) Take 1 tablet by mouth daily.  . Multiple Vitamins-Minerals (PRESERVISION AREDS 2) CAPS Take 2 capsules by mouth daily.  . sertraline (ZOLOFT) 25 MG tablet Take 1 tablet (25 mg total) by mouth daily.  Marland Kitchen triamcinolone (NASACORT) 55 MCG/ACT AERO nasal inhaler Place 1 spray into the nose daily as needed (allergies).  Allergies:   Mango flavor and Contrast media [iodinated diagnostic agents]   Social History   Tobacco Use  . Smoking status: Never Smoker  . Smokeless tobacco: Never Used  Vaping Use  . Vaping Use: Never used  Substance Use Topics  . Alcohol use: No  . Drug use: No     Family History: The patient's family history includes CAD (age of onset: 81) in her father; Lung cancer (age of onset: 59) in her father; Unexplained  death in her mother.  ROS:   Please see the history of present illness.    All other systems reviewed and are negative.  EKGs/Labs/Other Studies Reviewed:    The following studies were reviewed today:  EKG:  NSR, septal infarct  I have independently reviewed the images from [facility] 11/23/19.  Recent Labs: 11/29/2019: ALT 32 11/30/2019: Magnesium 2.7 12/04/2019: Hemoglobin 10.7; Platelets 260 12/05/2019: BUN 17; Creatinine, Ser 0.76; Potassium 3.9; Sodium 135  Recent Lipid Panel    Component Value Date/Time   CHOL 62 11/30/2019 0459   TRIG 68 11/30/2019 0459   HDL 20 (L) 11/30/2019 0459   CHOLHDL 3.1 11/30/2019 0459   VLDL 14 11/30/2019 0459   LDLCALC 28 11/30/2019 0459    Physical Exam:    VS:  BP (!) 160/82   Pulse 95   Ht 5\' 4"  (1.626 m)   Wt 182 lb 6.4 oz (82.7 kg)   SpO2 96%   BMI 31.31 kg/m     Wt Readings from Last 5 Encounters:  01/03/20 182 lb 6.4 oz (82.7 kg)  12/20/19 178 lb (80.7 kg)  12/13/19 186 lb 9.6 oz (84.6 kg)  12/06/19 189 lb 6 oz (85.9 kg)  11/25/19 184 lb 14.4 oz (83.9 kg)    Constitutional: No acute distress Eyes: sclera non-icteric, normal conjunctiva and lids ENMT: normal dentition, moist mucous membranes Cardiovascular: regular rhythm, normal rate, no murmurs. S1 and S2 normal. No jugular venous distention.  Chest: sternotomy well healing.  Respiratory: clear to auscultation bilaterally GI : normal bowel sounds, soft and nontender. No distention.   MSK: extremities warm, well perfused. No edema.  NEURO: grossly nonfocal exam, moves all extremities. PSYCH: alert and oriented x 3, normal mood and affect.   ASSESSMENT:    1. S/P CABG x 6   2. Coronary artery disease involving native coronary artery of native heart without angina pectoris   3. Left ventricular apical aneurysm   4. Medication management   5. Primary hypertension   6. Hyperlipidemia, unspecified hyperlipidemia type   7. 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 Indefinite DAPT per [facility]. - continue statin, will change BB.   Primary hypertension - needs better BP control in setting of stroke and CAD. Will start carvedilol and stop metoprolol.   Hyperlipidemia, unspecified hyperlipidemia type - lipids with excellent control on atorvastatin 80 mg daily. Continue.   Cerebrovascular accident (CVA) due to thrombosis of left cerebellar artery ([place]) - stroke follow up pending, encouraged her to keep these appts.   Total time of encounter: 35 minutes total time of encounter, including 25 minutes spent in face-to-face patient care on the date of this encounter. This time includes coordination of care and counseling  regarding above mentioned problem list. Remainder of non-face-to-face time involved reviewing chart documents/testing relevant to the patient encounter and documentation in the medical record. I have independently reviewed documentation from referring provider.   Cherlynn Kaiser, MD   [facility]    Medication Adjustments/Labs and Tests Ordered: Current medicines are reviewed at length with the patient today.  Concerns regarding medicines are outlined above.   Orders Placed This Encounter  Procedures  . Basic metabolic panel  . EKG 12-Lead    Meds ordered this encounter  Medications  .  furosemide (LASIX) 40 MG tablet    Sig: Take 1 tablet (40 mg total) by mouth daily.    Dispense:  7 tablet    Refill:  0  .  potassium chloride 20 MEQ TBCR    Sig: Take 20 mEq by mouth daily.    Dispense:  7 tablet    Refill:  0  .  carvedilol (COREG) 12.5 MG tablet    Sig: Take 1 tablet (12.5 mg total) by mouth 2 (two) times daily.     Dispense:  60 tablet    Refill:  3    Patient Instructions  Medication Instructions:  START: 20MEQ (1 TABLET) of Potassium Daily START: 40mg  Lasix (Furosemide) (1 TABLET) DAILY  START: 12.5mg  Carvedilol TWICE DAILY  STOP: METOPROLOL   *If you need a refill on your cardiac medications before your next appointment, please call your pharmacy*  Lab Work: BMET- In 10 Days  If you have labs (blood work) drawn today and your tests are completely normal, you will receive your results only by: Marland Kitchen MyChart Message (if you have MyChart) OR . A paper copy in the mail If you have any lab test that is abnormal or we need to change your treatment, we will call you to review the results.  Follow-Up: At [facility], you and your health needs are our priority.  As part of our continuing mission to provide you with exceptional heart care, we have created designated Provider Care Teams.  These Care Teams include your primary Cardiologist (physician) and Advanced Practice Providers (APPs -  Physician Assistants and Nurse Practitioners) who all work together to provide you with the care you need, when you need it.  Your next appointment:   6-8 weeks   The format for your next appointment:   In Person  Provider:   Cherlynn Kaiser, MD

## 2020-01-03 NOTE — Patient Instructions (Addendum)
Medication Instructions:  START: 20MEQ (1 TABLET) of Potassium Daily START: 40mg  Lasix (Furosemide) (1 TABLET) DAILY  START: 12.5mg  Carvedilol TWICE DAILY  STOP: METOPROLOL   *If you need a refill on your cardiac medications before your next appointment, please call your pharmacy*  Lab Work: BMET- In 10 Days  If you have labs (blood work) drawn today and your tests are completely normal, you will receive your results only by: Marland Kitchen MyChart Message (if you have MyChart) OR . A paper copy in the mail If you have any lab test that is abnormal or we need to change your treatment, we will call you to review the results.  Follow-Up: At [facility], you and your health needs are our priority.  As part of our continuing mission to provide you with exceptional heart care, we have created designated Provider Care Teams.  These Care Teams include your primary Cardiologist (physician) and Advanced Practice Providers (APPs -  Physician Assistants and Nurse Practitioners) who all work together to provide you with the care you need, when you need it.  Your next appointment:   6-8 weeks   The format for your next appointment:   In Person  Provider:   Cherlynn Kaiser, MD

## 2020-01-14 LAB — BASIC METABOLIC PANEL
BUN/Creatinine Ratio: 17 (ref 12–28)
BUN: 13 mg/dL (ref 8–27)
CO2: 28 mmol/L (ref 20–29)
Calcium: 10.3 mg/dL (ref 8.7–10.3)
Chloride: 98 mmol/L (ref 96–106)
Creatinine, Ser: 0.76 mg/dL (ref 0.57–1.00)
GFR calc Af Amer: 87 mL/min/{1.73_m2} (ref >59)
GFR calc non Af Amer: 75 mL/min/{1.73_m2} (ref >59)
Glucose: 100 mg/dL — ABNORMAL HIGH (ref 65–99)
Potassium: 4.2 mmol/L (ref 3.5–5.2)
Sodium: 140 mmol/L (ref 134–144)

## 2020-01-17 MED ORDER — CARVEDILOL 6.25 MG PO TABS
6.2500 mg | ORAL_TABLET | Freq: Two times a day (BID) | ORAL | 11 refills | Status: DC
Start: 2020-01-17 — End: 2020-12-11

## 2020-01-17 MED ORDER — CLOPIDOGREL BISULFATE 75 MG PO TABS
75.0000 mg | ORAL_TABLET | Freq: Every day | ORAL | 11 refills | Status: DC
Start: 2020-01-17 — End: 2020-11-22

## 2020-01-17 NOTE — Telephone Encounter (Signed)
Left detailed message on answering machine for pt.

## 2020-02-28 ENCOUNTER — Encounter: Payer: Self-pay | Admitting: Adult Health

## 2020-02-28 ENCOUNTER — Ambulatory Visit: Payer: Medicare Other | Admitting: Internal Medicine

## 2020-02-28 ENCOUNTER — Ambulatory Visit: Payer: Medicare Other | Admitting: Adult Health

## 2020-02-28 VITALS — BP 160/83 | HR 81 | Ht 64.0 in | Wt 174.0 lb

## 2020-02-28 DIAGNOSIS — B0229 Other postherpetic nervous system involvement: Secondary | ICD-10-CM | POA: Diagnosis not present

## 2020-02-28 DIAGNOSIS — I639 Cerebral infarction, unspecified: Secondary | ICD-10-CM

## 2020-02-28 MED ORDER — GABAPENTIN 300 MG PO CAPS: 300.0000 mg | ORAL_CAPSULE | Freq: Two times a day (BID) | ORAL | 3 refills | Status: DC

## 2020-02-28 NOTE — Progress Notes (Signed)
[facility] [address] [place]. [place] [address] [phone]       STROKE FOLLOW UP NOTE  [name] Date of Birth:  [birth year] Medical Record Number:  [id number]   Reason for Referral: stroke follow up    SUBJECTIVE:   CHIEF COMPLAINT:  Chief Complaint  Patient presents with  . Follow-up    Rm 14, states she stopped aspirin 3 weeks ago herself due to constant nose bleeds, states she is feeling great     HPI:   Today, 02/28/2020, Jamie Hodge returns for 25-month stroke follow-up.  She has been doing well from a stroke standpoint denying residual deficits or new stroke/TIA symptoms. 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  She was discharged on aspirin and Plavix but self discontinued aspirin approximately 3 weeks ago due to severe nosebleed reporting large clots from both sides.  This lasted for approximately 1 hr and eventually subsided after use of a teabag.  She has not had any additional nosebleeds or any other bleeding or bruising concerns and currently on Plavix alone.  She has follow-up visit scheduled cardiology this Thursday and plans on further discussing.  She has remained on atorvastatin 80 mg daily without myalgias.  Prior lipid panel 11/2019 showed LDL 28 (down from 258 09/2019!!).  Blood pressure today elevated and reports fluctuations at home with plans on further discussing further management with cardiology on Thursday.  Prior history of shingles with resultant postherpetic neuralgia which has not been present since mid fall 2021 but recently neuralgia symptoms restarted located left T10-L1 nerve distribution.  This has been currently interfering with daily activity as well as adequate sleep.  No further concerns at this time.    History provided for reference purposes only Initial visit 10/28/2019 JM: Jamie Hodge is being seen for hospital follow up. She reports since discharge she has been experiencing  excessive anxiety, headache, intermittent right leg numbness, vertigo and tinnitus bilaterally. No prior history of anxiety or depression. Chronic history of headaches but since stroke, has been experiencing generalized headaches more frequently.  Reports intermittent right leg numbness is not new since hospitalization.  Tinnitus and vertigo chronic condition.  She believes onset of vertigo and headache typically associated with increased anxiety.  Plans to establish care with PCP next week. Has scheduled visit with cardiology in 1.5 weeks.  Completed 3 weeks DAPT and remains on aspirin without bleeding or bruising.  Previously on atorvastatin 80 mg daily without myalgias but completed 30-day prescription provided at discharge and does not have PCP at this time for refill.  Blood pressure today 154/79.  She does not routinely monitor at home.  No further concerns at this time.  Stroke admission 09/23/2019 [name] is a 80 y.o. female with history of vertigo  who presented on 09/23/2019 with dizziness and right arm weakness.  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  Recommended DAPT for 3 weeks then aspirin alone.  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  LDL 258 and initiate atorvastatin 80 mg daily.  No prior history of HTN or DM.  Other stroke risk factors include advanced age and obesity but no prior stroke history.  Evaluated by therapy and discharged home in stable condition without therapy needs.  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   CT head - No evidence of  acute intracranial abnormality.  MRI head - Small subacute left cerebellar infarct. Mild chronic small vessel ischemic disease.   CTA H&N -  No emergent large vessel occlusion. 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  Patient counseled to be compliant with her antithrombotic medications  Ongoing aggressive stroke risk factor management  Therapy recommendations:  None  Disposition: Home today         ROS:   14 system review of systems performed and negative with exception of those listed in HPI  PMH:  Past Medical History:  Diagnosis Date  . Coronary artery disease   . CVA (cerebral vascular accident) ([place])   . Herpes zoster 2012  . Vertigo     PSH:  Past Surgical History:  Procedure Laterality Date  . CARDIAC CATHETERIZATION    . CORONARY ARTERY BYPASS GRAFT N/A 11/29/2019   Procedure: CORONARY ARTERY BYPASS GRAFTING (CABG) x 6 ON PUMP. USING LEFT INTERNAL MAMMARY ARTERY, LEFT RADIAL ARTERY AND RIGHT GREATER SAPHENOUS VEIN.;  Surgeon: Wonda Olds, MD;  Location: [facility];  Service: Open Heart Surgery;  Laterality: N/A;  . ENDOVEIN HARVEST OF GREATER SAPHENOUS VEIN Right 11/29/2019   Procedure: ENDOVEIN HARVEST OF GREATER SAPHENOUS VEIN;  Surgeon: Wonda Olds, MD;  Location: [facility];  Service: Open Heart Surgery;  Laterality: Right;  . LEFT HEART CATH AND CORONARY ANGIOGRAPHY N/A 11/23/2019   Procedure: LEFT HEART CATH AND CORONARY ANGIOGRAPHY;  Surgeon: Jettie Booze, MD;  Location: [facility];  Service: Cardiovascular;  Laterality: N/A;  . RADIAL ARTERY HARVEST Left 11/29/2019   Procedure: RADIAL ARTERY HARVEST;  Surgeon: Wonda Olds, MD;  Location: [facility];  Service: Open Heart Surgery;  Laterality: Left;  . RHINOPLASTY    . TEE WITHOUT  CARDIOVERSION N/A 11/29/2019   Procedure: TRANSESOPHAGEAL ECHOCARDIOGRAM (TEE);  Surgeon: Wonda Olds, MD;  Location: [facility];  Service: Open Heart Surgery;  Laterality: N/A;    Social History:  Social History   Socioeconomic History  . Marital status: Married    Spouse name: Not on file  . Number of children: Not on file  . Years of education: Not on file  . Highest education level: Not on file  Occupational History  . Not on file  Tobacco Use  . Smoking status: Never Smoker  . Smokeless tobacco: Never Used  Vaping Use  . Vaping Use: Never used  Substance and Sexual Activity  . Alcohol use: No  . Drug use: No  . Sexual activity: Never    Birth control/protection: None  Other Topics Concern  . Not on file  Social History Narrative  . Not on file   Social Determinants of Health   Financial Resource Strain: Not on file  Food Insecurity: Not on file  Transportation Needs: No Transportation Needs  . Lack of Transportation (Medical): No  . Lack of Transportation (Non-Medical): No  Physical Activity: Not on file  Stress: Not on file  Social Connections: Not on file  Intimate Partner  Violence: Not on file    Family History:  Family History  Problem Relation Age of Onset  . Unexplained death Mother        Died at the age of 61, no autopsy performed  . CAD Father 54  . Lung cancer Father 36    Medications:   Current Outpatient Medications on File Prior to Visit  Medication Sig Dispense Refill  . atorvastatin (LIPITOR) 80 MG tablet Take 1 tablet (80 mg total) by mouth daily. 90 tablet 3  . carvedilol (COREG) 6.25 MG tablet Take 1 tablet (6.25 mg total) by mouth 2 (two) times daily. 60 tablet 11  . clopidogrel (PLAVIX) 75 MG tablet Take 1 tablet (75 mg total) by mouth daily. 30 tablet 11  . Multiple Vitamin (MULTIVITAMIN ADULT PO) Take 1 tablet by mouth daily.     No current facility-administered medications on file prior to visit.    Allergies:   Allergies   Allergen Reactions  . Mango Flavor Anaphylaxis  . Contrast Media [Iodinated Diagnostic Agents] Other (See Comments)    Unknown reaction      OBJECTIVE:  Physical Exam  Vitals:   02/28/20 1556  BP: (!) 160/83  Pulse: 81  Weight: 174 lb (78.9 kg)  Height: 5\' 4"  (1.626 m)   Body mass index is 29.87 kg/m. No exam data present  General: well developed, well nourished, very pleasant elderly Caucasian female, seated, in no evident distress Head: head normocephalic and atraumatic.   Neck: supple with no carotid or supraclavicular bruits Cardiovascular: regular rate and rhythm, no murmurs Musculoskeletal: no deformity Skin:  no rash/petichiae Vascular:  Normal pulses all extremities   Neurologic Exam Mental Status: Awake and fully alert. Fluent speech and language. Oriented to place and time. Recent and remote memory intact. Attention span, concentration and fund of knowledge appropriate. Mood and affect appropriate.  Cranial Nerves: Pupils equal, briskly reactive to light. Extraocular movements full without nystagmus. Visual fields full to confrontation. Hearing intact. Facial sensation intact. Face, tongue, palate moves normally and symmetrically.  Motor: Normal bulk and tone. Normal strength in all tested extremity muscles. Sensory.: intact to touch , pinprick , position and vibratory sensation.  Coordination: Rapid alternating movements normal in all extremities. Finger-to-nose and heel-to-shin performed accurately bilaterally. Gait and Station: Arises from chair without difficulty. Stance is normal. Gait demonstrates normal stride length and balance without use of assistive device Reflexes: 1+ and symmetric. Toes downgoing.         ASSESSMENT: Jamie Hodge is a 80 y.o. year old female presented with dizziness and right arm weakness on 09/23/2019 with stroke work-up revealing small subacute left cerebellar infarct secondary to small vessel disease. Vascular risk  factors include HLD, severe multivessel CAD s/p CABG 11/2019, advanced age and obesity.      PLAN:  1. Left cerebellar stroke:  a. Recovered without residual deficit b. Continue clopidogrel 75 mg daily  and restart atorvastatin 80 mg daily for secondary stroke prevention.  c. Discussed secondary stroke prevention measures and importance of close PCP follow up for aggressive stroke risk factor management  2. HLD: LDL goal <70.  Prior LDL 28 11/2019 (down from 258) on atorvastatin 80 mg daily.  Request routine lipid panel monitoring and prescribing by PCP/cardiology 3. HTN: BP goal<130/90.  Elevated on carvedilol and furosemide per cardiology.  She plans on discussing further treatment options with cardiology on Thursday. 4. CAD s/p CABG: 65/7846 without complication.  Placed on DAPT but self discontinued aspirin due to  severe nosebleed.  Currently stable on Plavix.  Advised to discuss potential need of restarting aspirin from a cardiac perspective on Thursday at follow-up visit.  No indication for DAPT from a stroke standpoint 5. 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  As pain interferes with sleep and daily functioning, recommend trialing gabapentin 300 mg nightly for 3 to 4 days and then increase to twice daily dosing.  She has follow-up scheduled PCP on 2/24 and advised to discuss further during the visit with potential need of dosage increase or changing therapies if intolerant.    Follow up in 6 months or call earlier if needed   CC:  [facility] provider: Dr. Suzy Bouchard, [place]    I spent 30 minutes of face-to-face and non-face-to-face time with patient.  This included previsit chart review, lab review, study review, order entry, electronic health record documentation, patient education regarding prior stroke including etiology,  importance of managing stroke risk factors, s/p CABG and medication usage, postherpetic neuralgia and  therapeutic treatment options and answered all other questions to patient satisfaction   Frann Rider, [facility]  [facility] [address] [place], [place] [address]  Phone [phone] Fax [phone] Note: This document was prepared with digital dictation and possible smart phrase technology. Any transcriptional errors that result from this process are unintentional.

## 2020-02-28 NOTE — Patient Instructions (Addendum)
Trial gabapentin 300mg  nightly for post hepatic neuralgia. After 3-4 days, try to increase to twice daily. If day dose makes you feel too fatigued, you can take both capsules at night time. Follow up with PCP as scheduled to discuss possible need of dosage adjustment or other treatment options  Continue clopidogrel 75 mg daily  and atorvastatin 80mg  daily  for secondary stroke prevention  Discuss ongoing use of aspirin with cardiology on Thursdays   Continue to follow up with PCP regarding cholesterol and blood pressure management  Maintain strict control of hypertension with blood pressure goal below 130/90 and cholesterol with LDL cholesterol (bad cholesterol) goal below 70 mg/dL.       Followup in the future with me in 6 months or call earlier if needed      Thank you for coming to see [place] at [facility]. I hope we have been able to provide you high quality care today.  You may receive a patient satisfaction survey over the next few weeks. We would appreciate your feedback and comments so that we may continue to improve ourselves and the health of our patients.     Postherpetic Neuralgia Postherpetic neuralgia (PHN) is nerve pain that occurs after a shingles infection. Shingles is a painful rash that appears on one area of the body, usually on the trunk or face. Shingles is caused by the varicella-zoster virus. This is the same virus that causes chickenpox. In people who have had chickenpox, the virus can resurface years later and cause shingles. You may have PHN if you continue to have pain for 4 months after your shingles rash has gone away. PHN appears in the same area where you had the shingles rash. The pain usually goes away after the rash disappears. Getting a vaccination for shingles can prevent PHN. This vaccine is recommended for people older than 60. It may prevent shingles, and may also lower your risk of PHN if you do get shingles. What are the  causes? This condition is caused by damage to your nerves from the varicella-zoster virus. The damage makes your nerves overly sensitive. What increases the risk? The following factors may make you more likely to develop this condition:  Being older than 79 years of age.  Having severe pain before your shingles rash starts.  Having a severe rash.  Having shingles in and around the eye area.  Having a disease that makes your body unable to fight infections (weak immune system). What are the signs or symptoms? The main symptom of this condition is pain. The pain may:  Often be very bad and may be described as stabbing, burning, or feeling like an electric shock.  Come and go or may be there all the time.  Be triggered by light touches on the skin or changes in temperature. You may have itching along with the pain. How is this diagnosed? This condition may be diagnosed based on your symptoms and your history of shingles. Lab studies and other diagnostic tests are usually not needed. How is this treated? There is no cure for this condition. Treatment for PHN will focus on pain relief. Over-the-counter pain relievers do not usually relieve PHN pain. You may need to work with a pain specialist. Treatment may include:  Antidepressant medicines to help with pain and improve sleep.  Anti-seizure medicines to relieve nerve pain.  Strong pain relievers (opioids).  A numbing patch worn on the skin (lidocaine patch).  Botox (botulinum toxin) injections to block pain  signals between nerves and muscles.  Injections of numbing medicine or anti-inflammatory medicines around irritated nerves. Follow these instructions at home:  It may take a long time to recover from PHN. Work closely with your health care provider and develop a good support system at home.  Take over-the-counter and prescription medicines only as told by your health care provider.  Do not drive or use heavy machinery  while taking prescription pain medicine.  Wear loose, comfortable clothing.  Cover sensitive areas with a dressing to reduce friction from clothing rubbing on the area.  If directed, put ice on the painful area: ? Put ice in a plastic bag. ? Place a towel between your skin and the bag. ? Leave the ice on for 20 minutes, 2-3 times a day.  Talk to your health care provider if you feel depressed or desperate. Living with long-term pain can be depressing.  Keep all follow-up visits as told by your health care provider. This is important.   Contact a health care provider if:  Your medicine is not helping.  You are struggling to manage your pain at home. Summary  Postherpetic neuralgia is a very painful disorder that can occur after an episode of shingles.  The pain is often severe, burning, electric, or stabbing.  Prescription medicines can be helpful in managing persistent pain.  Getting a vaccination for shingles can prevent PHN. This vaccine is recommended for people older than 60. This information is not intended to replace advice given to you by your health care provider. Make sure you discuss any questions you have with your health care provider. Document Revised: 12/20/2016 Document Reviewed: 03/26/2016 Elsevier Patient Education  2021 [place].   Gabapentin capsules or tablets What is this medicine? GABAPENTIN (GA ba pen tin) is used to control seizures in certain types of epilepsy. It is also used to treat certain types of nerve pain. This medicine may be used for other purposes; ask your health care provider or pharmacist if you have questions. COMMON BRAND NAME(S): Active-PAC with Gabapentin, Orpha Bur, Gralise, Neurontin What should I tell my health care provider before I take this medicine? They need to know if you have any of these conditions:  history of drug abuse or alcohol abuse problem  kidney disease  lung or breathing disease  suicidal thoughts,  plans, or attempt; a previous suicide attempt by you or a family member  an unusual or allergic reaction to gabapentin, other medicines, foods, dyes, or preservatives  pregnant or trying to get pregnant  breast-feeding How should I use this medicine? Take this medicine by mouth with a glass of water. Follow the directions on the prescription label. You can take it with or without food. If it upsets your stomach, take it with food. Take your medicine at regular intervals. Do not take it more often than directed. Do not stop taking except on your doctor's advice. If you are directed to break the 600 or 800 mg tablets in half as part of your dose, the extra half tablet should be used for the next dose. If you have not used the extra half tablet within 28 days, it should be thrown away. A special MedGuide will be given to you by the pharmacist with each prescription and refill. Be sure to read this information carefully each time. Talk to your pediatrician regarding the use of this medicine in children. While this drug may be prescribed for children as young as 3 years for selected conditions, precautions do  apply. Overdosage: If you think you have taken too much of this medicine contact a poison control center or emergency room at once. NOTE: This medicine is only for you. Do not share this medicine with others. What if I miss a dose? If you miss a dose, take it as soon as you can. If it is almost time for your next dose, take only that dose. Do not take double or extra doses. What may interact with this medicine? This medicine may interact with the following medications:  alcohol  antihistamines for allergy, cough, and cold  certain medicines for anxiety or sleep  certain medicines for depression like amitriptyline, fluoxetine, sertraline  certain medicines for seizures like phenobarbital, primidone  certain medicines for stomach problems  general anesthetics like halothane, isoflurane,  methoxyflurane, propofol  local anesthetics like lidocaine, pramoxine, tetracaine  medicines that relax muscles for surgery  narcotic medicines for pain  phenothiazines like chlorpromazine, mesoridazine, prochlorperazine, thioridazine This list may not describe all possible interactions. Give your health care provider a list of all the medicines, herbs, non-prescription drugs, or dietary supplements you use. Also tell them if you smoke, drink alcohol, or use illegal drugs. Some items may interact with your medicine. What should I watch for while using this medicine? Visit your doctor or health care provider for regular checks on your progress. You may want to keep a record at home of how you feel your condition is responding to treatment. You may want to share this information with your doctor or health care provider at each visit. You should contact your doctor or health care provider if your seizures get worse or if you have any new types of seizures. Do not stop taking this medicine or any of your seizure medicines unless instructed by your doctor or health care provider. Stopping your medicine suddenly can increase your seizures or their severity. This medicine may cause serious skin reactions. They can happen weeks to months after starting the medicine. Contact your health care provider right away if you notice fevers or flu-like symptoms with a rash. The rash may be red or purple and then turn into blisters or peeling of the skin. Or, you might notice a red rash with swelling of the face, lips or lymph nodes in your neck or under your arms. Wear a medical identification bracelet or chain if you are taking this medicine for seizures, and carry a card that lists all your medications. You may get drowsy, dizzy, or have blurred vision. Do not drive, use machinery, or do anything that needs mental alertness until you know how this medicine affects you. To reduce dizzy or fainting spells, do not sit or  stand up quickly, especially if you are an older patient. Alcohol can increase drowsiness and dizziness. Avoid alcoholic drinks. Your mouth may get dry. Chewing sugarless gum or sucking hard candy, and drinking plenty of water will help. The use of this medicine may increase the chance of suicidal thoughts or actions. Pay special attention to how you are responding while on this medicine. Any worsening of mood, or thoughts of suicide or dying should be reported to your health care provider right away. Women who become pregnant while using this medicine may enroll in the [place] Pregnancy Registry by calling [phone]. This registry collects information about the safety of antiepileptic drug use during pregnancy. What side effects may I notice from receiving this medicine? Side effects that you should report to your doctor or health care  professional as soon as possible:  allergic reactions like skin rash, itching or hives, swelling of the face, lips, or tongue  breathing problems  rash, fever, and swollen lymph nodes  redness, blistering, peeling or loosening of the skin, including inside the mouth  suicidal thoughts, mood changes Side effects that usually do not require medical attention (report to your doctor or health care professional if they continue or are bothersome):  dizziness  drowsiness  headache  nausea, vomiting  swelling of ankles, feet, hands  tiredness This list may not describe all possible side effects. Call your doctor for medical advice about side effects. You may report side effects to FDA at 1-800-FDA-1088. Where should I keep my medicine? Keep out of reach of children. This medicine may cause accidental overdose and death if it taken by other adults, children, or pets. Mix any unused medicine with a substance like cat litter or coffee grounds. Then throw the medicine away in a sealed container like a sealed bag or a coffee can with  a lid. Do not use the medicine after the expiration date. Store at room temperature between 15 and 30 degrees C (59 and 86 degrees F). NOTE: This sheet is a summary. It may not cover all possible information. If you have questions about this medicine, talk to your doctor, pharmacist, or health care provider.  2021 Elsevier/Gold Standard (2018-04-10 14:16:43)

## 2020-02-29 NOTE — Progress Notes (Signed)
I agree with the above plan 

## 2020-03-02 ENCOUNTER — Ambulatory Visit: Payer: Medicare Other | Admitting: Internal Medicine

## 2020-03-02 ENCOUNTER — Encounter: Payer: Self-pay | Admitting: Internal Medicine

## 2020-03-02 ENCOUNTER — Other Ambulatory Visit: Payer: Self-pay

## 2020-03-02 VITALS — BP 144/82 | HR 78 | Ht 64.0 in | Wt 176.2 lb

## 2020-03-02 DIAGNOSIS — E785 Hyperlipidemia, unspecified: Secondary | ICD-10-CM

## 2020-03-02 DIAGNOSIS — I63342 Cerebral infarction due to thrombosis of left cerebellar artery: Secondary | ICD-10-CM

## 2020-03-02 DIAGNOSIS — I2510 Atherosclerotic heart disease of native coronary artery without angina pectoris: Secondary | ICD-10-CM

## 2020-03-02 DIAGNOSIS — Z951 Presence of aortocoronary bypass graft: Secondary | ICD-10-CM

## 2020-03-02 DIAGNOSIS — I253 Aneurysm of heart: Secondary | ICD-10-CM

## 2020-03-02 DIAGNOSIS — Z79899 Other long term (current) drug therapy: Secondary | ICD-10-CM | POA: Diagnosis not present

## 2020-03-02 DIAGNOSIS — I10 Essential (primary) hypertension: Secondary | ICD-10-CM

## 2020-03-02 NOTE — Patient Instructions (Signed)
Medication Instructions:  Your physician recommends that you continue on your current medications as directed. Please refer to the Current Medication list given to you today.  *If you need a refill on your cardiac medications before your next appointment, please call your pharmacy*   Follow-Up: At [facility], you and your health needs are our priority.  As part of our continuing mission to provide you with exceptional heart care, we have created designated Provider Care Teams.  These Care Teams include your primary Cardiologist (physician) and Advanced Practice Providers (APPs -  Physician Assistants and Nurse Practitioners) who all work together to provide you with the care you need, when you need it.  We recommend signing up for the patient portal called "MyChart".  Sign up information is provided on this After Visit Summary.  MyChart is used to connect with patients for Virtual Visits (Telemedicine).  Patients are able to view lab/test results, encounter notes, upcoming appointments, etc.  Non-urgent messages can be sent to your provider as well.   To learn more about what you can do with MyChart, go to [email or web address].    Your next appointment:   6 month(s)  The format for your next appointment:   In Person  Provider:   Gayatri Acharya, MD  

## 2020-03-02 NOTE — Progress Notes (Signed)
Cardiology Office Note:    Date:  03/02/2020   ID:  Jamie Hodge, DOB [birth year], MRN [id number]  PCP:  [facility] [place]  Cardiologist:  Elouise Munroe, MD  Electrophysiologist:  None   Referring MD: [facility]*   Chief Complaint/Reason for Referral: CAD s/p CABG  History of Present Illness:    Jamie Hodge is a 80 y.o. 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 CCTA showed severe CAD, and after cath she was referred for CABG for multivessel CAD. She underwent CABG on 11/29/19.   Did not tolerate carvedilol 12.5 mg BID, dose reduced to 6.25 mg BID. She is otherwise doing well. Stopped aspirin due to nosebleeds per neurology notes. Nosebleeding subsided with use of teabag.  No chest pain or SOB. Dizziness/lightheadedness improved.   Past Medical History:  Diagnosis Date   Coronary artery disease    CVA (cerebral vascular accident) ([place])    Herpes zoster 2012   Vertigo     Past Surgical History:  Procedure Laterality Date   CARDIAC CATHETERIZATION     CORONARY ARTERY BYPASS GRAFT N/A 11/29/2019   Procedure: CORONARY ARTERY BYPASS GRAFTING (CABG) x 6 ON PUMP. USING LEFT INTERNAL MAMMARY ARTERY, LEFT RADIAL ARTERY AND RIGHT GREATER SAPHENOUS VEIN.;  Surgeon: Wonda Olds, MD;  Location: [place];  Service: Open Heart Surgery;  Laterality: N/A;   ENDOVEIN HARVEST OF GREATER SAPHENOUS VEIN Right 11/29/2019   Procedure: ENDOVEIN HARVEST OF GREATER SAPHENOUS VEIN;  Surgeon: Wonda Olds, MD;  Location: [place];  Service: Open Heart Surgery;  Laterality: Right;   LEFT HEART CATH AND CORONARY ANGIOGRAPHY N/A 11/23/2019   Procedure: LEFT HEART CATH AND CORONARY ANGIOGRAPHY;  Surgeon: Jettie Booze, MD;  Location: [place] [facility];  Service: Cardiovascular;  Laterality: N/A;   RADIAL  ARTERY HARVEST Left 11/29/2019   Procedure: RADIAL ARTERY HARVEST;  Surgeon: Wonda Olds, MD;  Location: [place];  Service: Open Heart Surgery;  Laterality: Left;   RHINOPLASTY     TEE WITHOUT CARDIOVERSION N/A 11/29/2019   Procedure: TRANSESOPHAGEAL ECHOCARDIOGRAM (TEE);  Surgeon: Wonda Olds, MD;  Location: [place];  Service: Open Heart Surgery;  Laterality: N/A;    Current Medications: Current Meds  Medication Sig   atorvastatin (LIPITOR) 80 MG tablet Take 1 tablet (80 mg total) by mouth daily.   carvedilol (COREG) 6.25 MG tablet Take 1 tablet (6.25 mg total) by mouth 2 (two) times daily.   clopidogrel (PLAVIX) 75 MG tablet Take 1 tablet (75 mg total) by mouth daily.   gabapentin (NEURONTIN) 300 MG capsule Take 1 capsule (300 mg total) by mouth 2 (two) times daily.   Multiple Vitamin (MULTIVITAMIN ADULT PO) Take 1 tablet by mouth daily.     Allergies:   Mango flavor and Contrast media [iodinated diagnostic agents]   Social History   Tobacco Use   Smoking status: Never Smoker   Smokeless tobacco: Never Used  Vaping Use   Vaping Use: Never used  Substance Use Topics   Alcohol use: No   Drug use: No     Family History: The patient's family history includes CAD (age of onset: 46) in her father; Lung cancer (age of onset: 28) in her father; Unexplained death in her mother.  ROS:   Please see the history of present illness.    All other systems  reviewed and are negative.  EKGs/Labs/Other Studies Reviewed:    The following studies were reviewed today:   Recent Labs: 11/29/2019: ALT 32 11/30/2019: Magnesium 2.7 12/04/2019: Hemoglobin 10.7; Platelets 260 01/13/2020: BUN 13; Creatinine, Ser 0.76; Potassium 4.2; Sodium 140  Recent Lipid Panel    Component Value Date/Time   CHOL 62 11/30/2019 0459   TRIG 68 11/30/2019 0459   HDL 20 (L) 11/30/2019 0459   CHOLHDL 3.1 11/30/2019 0459   VLDL 14 11/30/2019 0459   LDLCALC 28 11/30/2019 0459    Physical  Exam:    VS:  BP (!) 144/82    Pulse 78    Ht 5\' 4"  (1.626 m)    Wt 176 lb 3.2 oz (79.9 kg)    BMI 30.24 kg/m     Wt Readings from Last 5 Encounters:  03/02/20 176 lb 3.2 oz (79.9 kg)  02/28/20 174 lb (78.9 kg)  01/03/20 182 lb 6.4 oz (82.7 kg)  12/20/19 178 lb (80.7 kg)  12/13/19 186 lb 9.6 oz (84.6 kg)    Constitutional: No acute distress Eyes: sclera non-icteric, normal conjunctiva and lids ENMT: normal dentition, moist mucous membranes Cardiovascular: regular rhythm, normal rate, no murmurs. S1 and S2 normal. No jugular venous distention. Sternal incision well healing. Respiratory: clear to auscultation bilaterally GI : normal bowel sounds, soft and nontender. No distention.   MSK: extremities warm, well perfused. No edema.  NEURO: grossly nonfocal exam, moves all extremities. PSYCH: alert and oriented x 3, normal mood and affect.   ASSESSMENT:    1. Coronary artery disease involving native coronary artery of native heart without angina pectoris   2. Left ventricular apical aneurysm   3. S/P CABG x 6   4. Medication management   5. Primary hypertension   6. Hyperlipidemia, unspecified hyperlipidemia type   7. 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  Primary hypertension - BP mildly elevated but she has some dizziness with increasing doses of antihypertensive therapy. Tolerating carvedilol 6.25 mg BID. May need to add HCTZ or low dose spironolactone.   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 Can continue plavix, does not need dapt for stroke.    Total time of encounter: 30 minutes total time of encounter, including 20 minutes spent in face-to-face patient care on the date of this encounter. This time includes coordination of care and counseling regarding above mentioned problem list. Remainder of non-face-to-face time involved reviewing chart documents/testing relevant to the patient encounter and documentation in the medical record. I have independently reviewed documentation from referring provider.   Cherlynn Kaiser, MD [place]   [facility]    Medication Adjustments/Labs and Tests Ordered: Current medicines are reviewed at length with the patient today.  Concerns regarding medicines are outlined above.   No orders of the defined types were placed in this encounter.   No orders of the defined types were placed in this encounter.   Patient Instructions  Medication Instructions:  Your physician recommends that you continue on your current medications as directed. Please refer to the Current Medication list given to you today.  *If you need a refill  on your cardiac medications before your next appointment, please call your pharmacy*   Follow-Up: At [facility], you and your health needs are our priority.  As part of our continuing mission to provide you with exceptional heart care, we have created designated Provider Care Teams.  These Care Teams include your primary Cardiologist (physician) and Advanced Practice Providers (APPs -  Physician Assistants and Nurse Practitioners) who all work together to provide you with the care you need, when you need it.  We recommend signing up for the patient portal called "MyChart".  Sign up information is provided on this After Visit Summary.  MyChart is used to connect with patients for Virtual Visits (Telemedicine).  Patients are able to view lab/test results, encounter notes, upcoming appointments, etc.  Non-urgent messages can be sent to your provider as well.   To learn more  about what you can do with MyChart, go to [email or web address].    Your next appointment:   6 month(s)  The format for your next appointment:   In Person  Provider:   Cherlynn Kaiser, MD

## 2020-03-16 DIAGNOSIS — E782 Mixed hyperlipidemia: Secondary | ICD-10-CM | POA: Diagnosis not present

## 2020-03-16 DIAGNOSIS — Z23 Encounter for immunization: Secondary | ICD-10-CM | POA: Diagnosis not present

## 2020-03-16 DIAGNOSIS — B0229 Other postherpetic nervous system involvement: Secondary | ICD-10-CM | POA: Diagnosis not present

## 2020-03-16 DIAGNOSIS — I10 Essential (primary) hypertension: Secondary | ICD-10-CM | POA: Diagnosis not present

## 2020-03-16 DIAGNOSIS — Z951 Presence of aortocoronary bypass graft: Secondary | ICD-10-CM | POA: Diagnosis not present

## 2020-03-16 DIAGNOSIS — I2510 Atherosclerotic heart disease of native coronary artery without angina pectoris: Secondary | ICD-10-CM | POA: Diagnosis not present

## 2020-03-16 DIAGNOSIS — Z0000 Encounter for general adult medical examination without abnormal findings: Secondary | ICD-10-CM | POA: Diagnosis not present

## 2020-03-24 ENCOUNTER — Telehealth: Payer: Self-pay

## 2020-03-24 NOTE — Telephone Encounter (Signed)
Please see telephone encounter

## 2020-03-24 NOTE — Telephone Encounter (Signed)
Attempted to call patient in regards to staff message received from Dr. Margaretann Loveless regarding patient restarting her Aspirin as patient should be on DAPT. Per staff message from Dr. Margaretann Loveless patient recently stopped Aspirin per Neurology notes due to Nosebleeds. Dr. Margaretann Loveless would like to see if patient would be up for restarting the Aspirin.   Left message for patient to call back to office when able to discuss.

## 2020-03-24 NOTE — Telephone Encounter (Signed)
Pt is returning call.  

## 2020-03-24 NOTE — Telephone Encounter (Signed)
Spoke with patient, made patient aware of Dr. Delphina Cahill recommendations. Patient states she will re start her Aspirin. Patient states that she has not had any nose bleeds and advised patient to call back to office if there are any issues after restarting. Patient states she has been losing her hair and would like to know if it could be related to her current medication regimen. Patient would like to see what Dr. Kaylyn Layer thought about this issue. Patient states that she currently feels very well. Patient states she is able to do more now and is very happy with how she feels. Advised patient I would forward message to Dr. Margaretann Loveless. Advised patient to call back to office with any issues, questions, or concerns. Patient verbalized understanding.

## 2020-05-24 DIAGNOSIS — H2513 Age-related nuclear cataract, bilateral: Secondary | ICD-10-CM | POA: Diagnosis not present

## 2020-05-24 DIAGNOSIS — H353132 Nonexudative age-related macular degeneration, bilateral, intermediate dry stage: Secondary | ICD-10-CM | POA: Diagnosis not present

## 2020-06-07 ENCOUNTER — Encounter: Payer: Self-pay | Admitting: Adult Health

## 2020-06-29 DIAGNOSIS — H2512 Age-related nuclear cataract, left eye: Secondary | ICD-10-CM | POA: Diagnosis not present

## 2020-06-29 DIAGNOSIS — H353131 Nonexudative age-related macular degeneration, bilateral, early dry stage: Secondary | ICD-10-CM | POA: Diagnosis not present

## 2020-06-29 DIAGNOSIS — H2513 Age-related nuclear cataract, bilateral: Secondary | ICD-10-CM | POA: Diagnosis not present

## 2020-06-29 DIAGNOSIS — H25013 Cortical age-related cataract, bilateral: Secondary | ICD-10-CM | POA: Diagnosis not present

## 2020-06-29 DIAGNOSIS — H18413 Arcus senilis, bilateral: Secondary | ICD-10-CM | POA: Diagnosis not present

## 2020-08-18 ENCOUNTER — Telehealth: Payer: Self-pay

## 2020-08-18 NOTE — Telephone Encounter (Signed)
   Patient Name: Jamie Hodge  DOB: 1940/11/11 MRN: [id number]  Primary Cardiologist: Elouise Munroe, MD  Chart reviewed as part of pre-operative protocol coverage. Cataract extractions are recognized in guidelines as low risk surgeries that do not typically require specific preoperative testing or holding of blood thinner therapy. Therefore, given past medical history and time since last visit, based on ACC/AHA guidelines, AMRAN MARREN would be at acceptable risk for the planned procedure without further cardiovascular testing.   I will route this recommendation to the requesting party via Epic fax function and remove from pre-op pool.  Please call with questions.  Deberah Pelton, NP 08/18/2020, 10:59 AM

## 2020-08-18 NOTE — Telephone Encounter (Signed)
   [facility] Pre-operative Risk Assessment    Patient Name: Jamie Hodge  DOB: 04-12-1940 MRN: [id number]  HEARTCARE STAFF:  - IMPORTANT!!!!!! Under Visit Info/Reason for Call, type in Other and utilize the format Clearance MM/DD/YY or Clearance TBD. Do not use dashes or single digits. - Please review there is not already an duplicate clearance open for this procedure. - If request is for dental extraction, please clarify the # of teeth to be extracted. - If the patient is currently at the dentist's office, call Pre-Op Callback Staff (MA/nurse) to input urgent request.  - If the patient is not currently in the dentist office, please route to the Pre-Op pool.  Request for surgical clearance:  What type of surgery is being performed? CATARACT EXTRACTION W/INTRAOCULAR LENS RIGHT EYE FOLLOWED BY LEFT EYE  When is this surgery scheduled? 08-30-20 THEN 09-13-20  What type of clearance is required (medical clearance vs. Pharmacy clearance to hold med vs. Both)? MEDICAL  Are there any medications that need to be held prior to surgery and how long? NO, PER FORM DOES NOT HAVE TO STOP ANY MEDICATIONS  Practice name and name of physician performing surgery? [facility]  DR Christia Reading BEVIS,MD/DR LISA SUN,MD  ATTN"CHERI  What is the office phone number? [phone]   7.   What is the office fax number? [phone]  8.   Anesthesia type (None, local, MAC, general) ? TOPICAL w/[facility] [place] 08/18/2020, 10:47 AM  _________________________________________________________________   (provider comments below)

## 2020-08-28 ENCOUNTER — Ambulatory Visit: Payer: Medicare Other | Admitting: Adult Health

## 2020-08-28 ENCOUNTER — Encounter: Payer: Self-pay | Admitting: Adult Health

## 2020-08-28 ENCOUNTER — Other Ambulatory Visit: Payer: Self-pay

## 2020-08-28 VITALS — BP 122/74 | HR 73 | Ht 64.0 in | Wt 195.0 lb

## 2020-08-28 DIAGNOSIS — I10 Essential (primary) hypertension: Secondary | ICD-10-CM | POA: Diagnosis not present

## 2020-08-28 DIAGNOSIS — E785 Hyperlipidemia, unspecified: Secondary | ICD-10-CM | POA: Diagnosis not present

## 2020-08-28 DIAGNOSIS — I639 Cerebral infarction, unspecified: Secondary | ICD-10-CM | POA: Diagnosis not present

## 2020-08-28 DIAGNOSIS — B0229 Other postherpetic nervous system involvement: Secondary | ICD-10-CM | POA: Diagnosis not present

## 2020-08-28 MED ORDER — AMITRIPTYLINE HCL 10 MG PO TABS: 10.0000 mg | ORAL_TABLET | Freq: Every day | ORAL | 3 refills | Status: DC

## 2020-08-28 NOTE — Patient Instructions (Addendum)
Continue clopidogrel 75 mg daily  and atorvastatin 80 mg daily for secondary stroke prevention  Continue both aspirin and Plavix per cardiology recommendations with routine follow-up  Continue to follow up with PCP regarding cholesterol and blood pressure management  Maintain strict control of hypertension with blood pressure goal below 130/90 and cholesterol with LDL cholesterol (bad cholesterol) goal below 70 mg/dL.   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 If symptoms improve, we can start amitriptyline '10mg'$  nightly      Follow up in 4 months or call earlier if needed      Thank you for coming to see [place] at [facility]. I hope we have been able to provide you high quality care today.  You may receive a patient satisfaction survey over the next few weeks. We would appreciate your feedback and comments so that we may continue to improve ourselves and the health of our patients.    Amitriptyline Tablets What is this medication? AMITRIPTYLINE (a mee TRIP ti leen) treats depression. It increases the amount of serotonin and norepinephrine in the brain, hormones that help regulate mood.It belongs to a group of medications called tricyclic antidepressants (TCAs). This medicine may be used for other purposes; ask your health care provider orpharmacist if you have questions. COMMON BRAND NAME(S): Elavil, Vanatrip What should I tell my care team before I take this medication? They need to know if you have any of these conditions: An alcohol problem Asthma, trouble breathing Bipolar disorder or schizophrenia Difficulty passing urine, prostate trouble Glaucoma Heart disease or previous heart attack Liver disease Over active thyroid Seizures Thoughts or plans of suicide, a previous suicide attempt, or family history of suicide attempt An unusual or allergic reaction to  amitriptyline, other medications, foods, dyes, or preservatives Pregnant or trying to get pregnant Breast-feeding How should I use this medication? Take this medication by mouth with a drink of water. Follow the directions on the prescription label. You can take the tablets with or without food. Take your medication at regular intervals. Do not take it more often than directed. Do not stop taking this medication suddenly except upon the advice of your care team. Stopping this medication too quickly may cause serious side effects oryour condition may worsen. A special MedGuide will be given to you by the pharmacist with eachprescription and refill. Be sure to read this information carefully each time. Talk to your care team regarding the use of this medication in children.Special care may be needed. Overdosage: If you think you have taken too much of this medicine contact apoison control center or emergency room at once. NOTE: This medicine is only for you. Do not share this medicine with others. What if I miss a dose? If you miss a dose, take it as soon as you can. If it is almost time for yournext dose, take only that dose. Do not take double or extra doses. What may interact with this medication? Do not take this medication with any of the following: Arsenic trioxide Certain medications used to regulate abnormal heartbeat or to treat other heart conditions Cisapride Droperidol Halofantrine Linezolid MAOIs like Carbex, Eldepryl, Marplan, Nardil, and Parnate Methylene blue Other medications for mental depression Phenothiazines like perphenazine, thioridazine and chlorpromazine Pimozide Probucol Procarbazine Sparfloxacin St. John's Wort This medication may also interact with the following: Atropine and related medications like hyoscyamine, scopolamine, tolterodine and others Barbiturate medications for inducing  sleep or treating seizures, like  phenobarbital Cimetidine Disulfiram Ethchlorvynol Thyroid hormones such as levothyroxine Ziprasidone This list may not describe all possible interactions. Give your health care provider a list of all the medicines, herbs, non-prescription drugs, or dietary supplements you use. Also tell them if you smoke, drink alcohol, or use illegaldrugs. Some items may interact with your medicine. What should I watch for while using this medication? Tell your care team if your symptoms do not get better or if they get worse. Visit your care team for regular checks on your progress. Because it may take several weeks to see the full effects of this medication, it is important tocontinue your treatment as prescribed by your care team. Patients and their families should watch out for new or worsening thoughts of suicide or depression. Also watch out for sudden changes in feelings such as feeling anxious, agitated, panicky, irritable, hostile, aggressive, impulsive, severely restless, overly excited and hyperactive, or not being able to sleep. If this happens, especially at the beginning of treatment or after a change indose, call your care team. You may get drowsy or dizzy. Do not drive, use machinery, or do anything that needs mental alertness until you know how this medication affects you. Do not stand or sit up quickly, especially if you are an older patient. This reduces the risk of dizzy or fainting spells. Alcohol may interfere with the effect ofthis medication. Avoid alcoholic drinks. Do not treat yourself for coughs, colds, or allergies without asking your careteam for advice. Some ingredients can increase possible side effects. Your mouth may get dry. Chewing sugarless gum or sucking hard candy, and drinking plenty of water will help. Contact your care team if the problem doesnot go away or is severe. This medication may cause dry eyes and blurred vision. If you wear contact lenses you may feel some discomfort.  Lubricating drops may help. See your eyedoctor if the problem does not go away or is severe. This medication can cause constipation. Try to have a bowel movement at least every 2 to 3 days. If you do not have a bowel movement for 3 days, call yourcare team. This medication can make you more sensitive to the sun. Keep out of the sun. If you cannot avoid being in the sun, wear protective clothing and use sunscreen.Do not use sun lamps or tanning beds/booths. What side effects may I notice from receiving this medication? Side effects that you should report to your care team as soon as possible: Allergic reactions-skin rash, itching, hives, swelling of the face, lips, tongue, or throat Heart rhythm changes- fast or irregular heartbeat, dizziness, feeling faint or lightheaded, chest pain, trouble breathing Serotonin syndrome-irritability, confusion, fast or irregular heartbeat, muscle stiffness, twitching muscles, sweating, high fever, seizure, chills, vomiting, diarrhea Sudden eye pain or change in vision such as blurry vision, seeing halos around lights, vision loss Thoughts of suicide or self-harm, worsening mood, feelings of depression Side effects that usually do not require medical attention (report to your careteam if they continue or are bothersome): Change in appetite or weight Change in sex drive or performance Constipation Dizziness Drowsiness Dry mouth Tremors This list may not describe all possible side effects. Call your doctor for medical advice about side effects. You may report side effects to FDA at1-800-FDA-1088. Where should I keep my medication? Keep out of the reach of children and pets. Store at room temperature between 20 and 25 degrees C (68 and 77 degrees F).Throw away any unused medication after  the expiration date. NOTE: This sheet is a summary. It may not cover all possible information. If you have questions about this medicine, talk to your doctor, pharmacist, orhealth  care provider.  2022 Elsevier/Gold Standard (2020-02-04 13:09:40)

## 2020-08-28 NOTE — Progress Notes (Signed)
[facility] [address] [place]. [place] [address] [phone]       STROKE FOLLOW UP NOTE  Jamie Hodge Date of Birth:  May 19, 1940 Medical Record Number:  [id number]   Reason for Referral: stroke follow up    SUBJECTIVE:   CHIEF COMPLAINT:  Chief Complaint  Patient presents with   Follow-up    Rm 3 here for 6 month f/u- Reports she has been doing well. Reports she is having cataract surgery on Wed. She recently increased her gabapentin to 4 per day by PCP and feels groggy the next.     HPI:   Today, 08/28/2020, Jamie Hodge returns for 44-monthstroke follow-up.  Overall doing well.  Denies new or reoccurring stroke/TIA symptoms.  Remains on aspirin and Plavix as well as atorvastatin without associated side effects.  Prior concern of epistaxis at prior visit with d/c'ing aspirin - she has since restarted as advised by cardiology without any reoccurring issues.  blood pressure today 122/74.  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 Previously taking '600mg'$  nightly and increased straight to '1200mg'$  nightly. Was tolerating '600mg'$  nightly without side effects but no benefit. Gabapentin initially working well but eventually started to experience worsening pain.  She does report intermittent flareups worse at night but no specific interval.  Reports trialing patches (both OTC and RX) in the past but only limited benefit.  Plans to undergo cataract surgery Wednesday in left eye and in 2 weeks right eye.  No further concerns at this    History provided for reference purposes only Update 02/28/2020 JM: Ms. AProefrockreturns for 428-monthtroke follow-up.  She has been doing well from a stroke standpoint denying residual deficits or new stroke/TIA symptoms. 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  She was discharged on  aspirin and Plavix but self discontinued aspirin approximately 3 weeks ago due to severe nosebleed reporting large clots from both sides.  This lasted for approximately 1 hr and eventually subsided after use of a teabag.  She has not had any additional nosebleeds or any other bleeding or bruising concerns and currently on Plavix alone.  She has follow-up visit scheduled cardiology this Thursday and plans on further discussing.  She has remained on atorvastatin 80 mg daily without myalgias.  Prior lipid panel 11/2019 showed LDL 28 (down from 258 09/2019!!).  Blood pressure today elevated and reports fluctuations at home with plans on further discussing further management with cardiology on Thursday.  Prior history of shingles with resultant postherpetic neuralgia which has not been present since mid fall 2021 but recently neuralgia symptoms restarted located left T10-L1 nerve distribution.  This has been currently interfering with daily activity as well as adequate sleep.  No further concerns at this time.  Initial visit 10/28/2019 JM: Jamie Hodge for hospital follow up. She reports since discharge she has been experiencing excessive anxiety, headache, intermittent right leg numbness, vertigo and tinnitus bilaterally. No prior history of anxiety or depression. Chronic history of headaches but since stroke, has been experiencing generalized headaches more frequently.  Reports intermittent right leg numbness is not new since hospitalization.  Tinnitus and vertigo chronic condition.  She believes onset of vertigo and headache typically associated with increased anxiety.  Plans to establish care with PCP next week. Has scheduled visit with cardiology in 1.5 weeks.  Completed 3 weeks  DAPT and remains on aspirin without bleeding or bruising.  Previously on atorvastatin 80 mg daily without myalgias but completed 30-day prescription provided at discharge and does not have PCP at this time for refill.  Blood  pressure today 154/79.  She does not routinely monitor at home.  No further concerns at this time.  Stroke admission 09/23/2019 Jamie Hodge is a 80 y.o. female with history of vertigo  who presented on 09/23/2019 with dizziness and right arm weakness.  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  Recommended DAPT for 3 weeks then aspirin alone.  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  LDL 258 and initiate atorvastatin 80 mg daily.  No prior history of HTN or DM.  Other stroke risk factors include advanced age and obesity but no prior stroke history.  Evaluated by therapy and discharged home in stable condition without therapy needs.  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  CT head - No evidence of acute intracranial abnormality. MRI head - Small subacute left cerebellar infarct. Mild chronic small vessel ischemic disease.  CTA H&N -  No emergent large vessel occlusion. 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 Patient counseled to be compliant with her antithrombotic medications Ongoing aggressive stroke risk factor management Therapy recommendations:  None Disposition: Home today         ROS:   14 system review of systems performed and negative with exception of those listed in HPI  PMH:  Past Medical History:  Diagnosis Date   Coronary artery disease    CVA  (cerebral vascular accident) ([place])    Herpes zoster 2012   Vertigo     PSH:  Past Surgical History:  Procedure Laterality Date   CARDIAC CATHETERIZATION     CORONARY ARTERY BYPASS GRAFT N/A 11/29/2019   Procedure: CORONARY ARTERY BYPASS GRAFTING (CABG) x 6 ON PUMP. USING LEFT INTERNAL MAMMARY ARTERY, LEFT RADIAL ARTERY AND RIGHT GREATER SAPHENOUS VEIN.;  Surgeon: Wonda Olds, MD;  Location: [facility];  Service: Open Heart Surgery;  Laterality: N/A;   ENDOVEIN HARVEST OF GREATER SAPHENOUS VEIN Right 11/29/2019   Procedure: ENDOVEIN HARVEST OF GREATER SAPHENOUS VEIN;  Surgeon: Wonda Olds, MD;  Location: [facility];  Service: Open Heart Surgery;  Laterality: Right;   LEFT HEART CATH AND CORONARY ANGIOGRAPHY N/A 11/23/2019   Procedure: LEFT HEART CATH AND CORONARY ANGIOGRAPHY;  Surgeon: Jettie Booze, MD;  Location: [facility];  Service: Cardiovascular;  Laterality: N/A;   RADIAL ARTERY HARVEST Left 11/29/2019   Procedure: RADIAL ARTERY HARVEST;  Surgeon: Wonda Olds, MD;  Location: [facility];  Service: Open Heart Surgery;  Laterality: Left;   RHINOPLASTY     TEE WITHOUT CARDIOVERSION N/A 11/29/2019   Procedure: TRANSESOPHAGEAL ECHOCARDIOGRAM (TEE);  Surgeon: Wonda Olds, MD;  Location: [facility];  Service: Open Heart Surgery;  Laterality: N/A;    Social History:  Social  History   Socioeconomic History   Marital status: Married    Spouse name: Not on file   Number of children: Not on file   Years of education: Not on file   Highest education level: Not on file  Occupational History   Not on file  Tobacco Use   Smoking status: Never   Smokeless tobacco: Never  Vaping Use   Vaping Use: Never used  Substance and Sexual Activity   Alcohol use: No   Drug use: No   Sexual activity: Never    Birth control/protection: None  Other Topics Concern   Not on file  Social History Narrative   Not on file   Social Determinants of Health   Financial Resource Strain: Not  on file  Food Insecurity: Not on file  Transportation Needs: No Transportation Needs   Lack of Transportation (Medical): No   Lack of Transportation (Non-Medical): No  Physical Activity: Not on file  Stress: Not on file  Social Connections: Not on file  Intimate Partner Violence: Not on file    Family History:  Family History  Problem Relation Age of Onset   Unexplained death Mother        Died at the age of 3, no autopsy performed   CAD Father 84   Lung cancer Father 58    Medications:   Current Outpatient Medications on File Prior to Visit  Medication Sig Dispense Refill   atorvastatin (LIPITOR) 80 MG tablet Take 1 tablet (80 mg total) by mouth daily. 90 tablet 3   carvedilol (COREG) 6.25 MG tablet Take 1 tablet (6.25 mg total) by mouth 2 (two) times daily. 60 tablet 11   clopidogrel (PLAVIX) 75 MG tablet Take 1 tablet (75 mg total) by mouth daily. 30 tablet 11   gabapentin (NEURONTIN) 300 MG capsule Take 1 capsule (300 mg total) by mouth 2 (two) times daily. (Patient taking differently: Take 300 mg by mouth 4 (four) times daily.) 60 capsule 3   Multiple Vitamin (MULTIVITAMIN ADULT PO) Take 1 tablet by mouth daily.     [DISCONTINUED] isosorbide mononitrate (IMDUR) 30 MG 24 hr tablet Take 1 tablet (30 mg total) by mouth daily. 30 tablet 0   No current facility-administered medications on file prior to visit.    Allergies:   Allergies  Allergen Reactions   Mango Flavor Anaphylaxis   Contrast Media [Iodinated Diagnostic Agents] Other (See Comments)    Unknown reaction      OBJECTIVE:  Physical Exam  Vitals:   08/28/20 1538  BP: 122/74  Pulse: 73  SpO2: 97%  Weight: 195 lb (88.5 kg)  Height: '5\' 4"'$  (1.626 m)    Body mass index is 33.47 kg/m. No results found.  General: well developed, well nourished, very pleasant elderly Caucasian female, seated, in no evident distress Head: head normocephalic and atraumatic.   Neck: supple with no carotid or  supraclavicular bruits Cardiovascular: regular rate and rhythm, no murmurs Musculoskeletal: no deformity Skin:  no rash/petichiae Vascular:  Normal pulses all extremities   Neurologic Exam Mental Status: Awake and fully alert. Fluent speech and language. Oriented to place and time. Recent and remote memory intact. Attention span, concentration and fund of knowledge appropriate. Mood and affect appropriate.  Cranial Nerves: Pupils equal, briskly reactive to light. Extraocular movements full without nystagmus. Visual fields full to confrontation. Hearing intact. Facial sensation intact. Face, tongue, palate moves normally and symmetrically.  Motor: Normal bulk and tone. Normal strength in all tested extremity muscles. Sensory.:  intact to touch , pinprick , position and vibratory sensation.  PHN left L1 nerve root Coordination: Rapid alternating movements normal in all extremities. Finger-to-nose and heel-to-shin performed accurately bilaterally. Gait and Station: Arises from chair without difficulty. Stance is normal. Gait demonstrates normal stride length and balance without use of assistive device Reflexes: 1+ and symmetric. Toes downgoing.         ASSESSMENT: Jamie Hodge is a 80 y.o. year old female presented with dizziness and right arm weakness on 09/23/2019 with stroke work-up revealing small subacute left cerebellar infarct secondary to small vessel disease. Vascular risk factors include HLD, severe multivessel CAD s/p CABG 11/2019, advanced age and obesity.      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  Discussed secondary stroke prevention measures and importance of close PCP follow up for aggressive stroke risk factor management  HLD: LDL goal <70.  Prior LDL 28 11/2019 (down from 258) on atorvastatin 80 mg daily.  Request routine lipid panel  monitoring and prescribing by PCP/cardiology HTN: BP goal<130/90.  Elevated on carvedilol and furosemide per cardiology.  She plans on discussing further treatment options with cardiology on Thursday. CAD s/p CABG: 99991111 without complication.  Continue DAPT per cards recommendation. 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   Initial benefit with gabapentin - recently increased to '1200mg'$  (3x '300mg'$  caps) nightly with resultant morning fatigue - advised to decrease amt to 3 cap ('900mg'$ ) nightly to help improve morning fatigue. Intolerance to daytime dosage d/t fatigue. If morning fatigue improves with dosage adjustment, recommend starting amitriptyline 10 mg nightly.  Discussed potential side effects and advised to call office with any difficulty tolerating or possible need of dosage adjustment after 1 week.  May considering discontinuing gabapentin if able to the future. Previously trialed OTC and RX patches with limited benefit     Follow up in 4 months or call earlier if needed   CC:  [place] provider: Dr. Suzy Bouchard, [place]    I spent 31 minutes of face-to-face and non-face-to-face time with patient.  This included previsit chart review, lab review, study review, order entry, electronic health record documentation, patient education regarding prior stroke including etiology, secondary stroke prevention measures and importance of managing stroke risk factors, s/p CABG and medication usage, postherpetic neuralgia and therapeutic treatment options and potential medication side effects and answered all other questions to patient satisfaction   Frann Rider, AGNP-BC  [facility] [address] [place], [place] [address]  Phone [phone] Fax [phone] Note: This document was prepared with digital dictation and possible smart phrase technology. Any transcriptional errors  that result from this process are unintentional.

## 2020-08-30 DIAGNOSIS — H2513 Age-related nuclear cataract, bilateral: Secondary | ICD-10-CM | POA: Diagnosis not present

## 2020-08-30 DIAGNOSIS — H2512 Age-related nuclear cataract, left eye: Secondary | ICD-10-CM | POA: Diagnosis not present

## 2020-08-31 ENCOUNTER — Ambulatory Visit: Payer: Medicare Other | Admitting: Medical

## 2020-08-31 DIAGNOSIS — H2511 Age-related nuclear cataract, right eye: Secondary | ICD-10-CM | POA: Diagnosis not present

## 2020-09-04 NOTE — Progress Notes (Signed)
I agree with the above plan 

## 2020-09-13 DIAGNOSIS — Z961 Presence of intraocular lens: Secondary | ICD-10-CM | POA: Diagnosis not present

## 2020-09-13 DIAGNOSIS — H2513 Age-related nuclear cataract, bilateral: Secondary | ICD-10-CM | POA: Diagnosis not present

## 2020-09-13 DIAGNOSIS — H2511 Age-related nuclear cataract, right eye: Secondary | ICD-10-CM | POA: Diagnosis not present

## 2020-09-20 ENCOUNTER — Other Ambulatory Visit: Payer: Self-pay | Admitting: Adult Health

## 2020-09-21 DIAGNOSIS — H2513 Age-related nuclear cataract, bilateral: Secondary | ICD-10-CM | POA: Diagnosis not present

## 2020-09-21 DIAGNOSIS — Z9849 Cataract extraction status, unspecified eye: Secondary | ICD-10-CM | POA: Diagnosis not present

## 2020-09-21 DIAGNOSIS — H527 Unspecified disorder of refraction: Secondary | ICD-10-CM | POA: Diagnosis not present

## 2020-09-21 DIAGNOSIS — Z961 Presence of intraocular lens: Secondary | ICD-10-CM | POA: Diagnosis not present

## 2020-09-29 ENCOUNTER — Other Ambulatory Visit: Payer: Self-pay | Admitting: Adult Health

## 2020-10-09 ENCOUNTER — Ambulatory Visit: Payer: Medicare Other | Admitting: Physician Assistant

## 2020-10-09 ENCOUNTER — Encounter: Payer: Self-pay | Admitting: Internal Medicine

## 2020-10-09 ENCOUNTER — Ambulatory Visit: Payer: Medicare Other | Admitting: Internal Medicine

## 2020-10-09 ENCOUNTER — Other Ambulatory Visit: Payer: Self-pay

## 2020-10-09 VITALS — BP 164/88 | HR 80 | Ht 64.0 in | Wt 197.0 lb

## 2020-10-09 DIAGNOSIS — Z951 Presence of aortocoronary bypass graft: Secondary | ICD-10-CM | POA: Diagnosis not present

## 2020-10-09 DIAGNOSIS — E785 Hyperlipidemia, unspecified: Secondary | ICD-10-CM | POA: Diagnosis not present

## 2020-10-09 DIAGNOSIS — I2510 Atherosclerotic heart disease of native coronary artery without angina pectoris: Secondary | ICD-10-CM | POA: Diagnosis not present

## 2020-10-09 DIAGNOSIS — Z79899 Other long term (current) drug therapy: Secondary | ICD-10-CM | POA: Diagnosis not present

## 2020-10-09 DIAGNOSIS — I63342 Cerebral infarction due to thrombosis of left cerebellar artery: Secondary | ICD-10-CM

## 2020-10-09 DIAGNOSIS — I253 Aneurysm of heart: Secondary | ICD-10-CM | POA: Diagnosis not present

## 2020-10-09 DIAGNOSIS — I10 Essential (primary) hypertension: Secondary | ICD-10-CM | POA: Diagnosis not present

## 2020-10-09 MED ORDER — SPIRONOLACTONE 25 MG PO TABS: 25.0000 mg | ORAL_TABLET | Freq: Every day | ORAL | 3 refills | Status: DC

## 2020-10-09 NOTE — Patient Instructions (Signed)
Medication Instructions:  START: SPIRONOLACTONE 25mg  DAILY  *If you need a refill on your cardiac medications before your next appointment, please call your pharmacy*  Lab Work: PLEASE RETURN in 7-10 [place] BMET BLOOD WORK  If you have labs (blood work) drawn today and your tests are completely normal, you will receive your results only by: [place] (if you have MyChart) OR A paper copy in the mail If you have any lab test that is abnormal or we need to change your treatment, we will call you to review the results.  Follow-Up: At [facility], you and your health needs are our priority.  As part of our continuing mission to provide you with exceptional heart care, we have created designated Provider Care Teams.  These Care Teams include your primary Cardiologist (physician) and Advanced Practice Providers (APPs -  Physician Assistants and Nurse Practitioners) who all work together to provide you with the care you need, when you need it.  Your next appointment:   October 25th at 9:20am   The format for your next appointment:   In Person  Provider:   Cherlynn Kaiser, MD

## 2020-10-09 NOTE — Progress Notes (Signed)
Cardiology Office Note:    Date:  10/09/2020   ID:  [name], DOB 1940-12-15, MRN [id number]  PCP:  [facility], [place]  Cardiologist:  Elouise Munroe, MD  Electrophysiologist:  None   Referring MD: [facility]*   Chief Complaint/Reason for Referral: CAD s/p CABG  History of Present Illness:    Jamie Hodge is a 80 y.o. 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 CCTA showed severe CAD, and after cath she was referred for CABG for multivessel CAD. She underwent CABG on 11/29/19. Did not tolerate carvedilol 12.5 mg BID, dose reduced to 6.25 mg BID. She is otherwise doing well. Stopped aspirin due to nosebleeds per neurology notes. Nosebleeding subsided with use of teabag. She is now back on aspirin and continues to take Plavix for dual antiplatelet therapy.  Per Dr. Orvan Seen last note, she is to continue Plavix indefinitely, though somewhat unclear if aspirin was intended to be continued as well.  We have continued her on DAPT with no significant issues, I will clarify this with cardiac surgery to see if we can pursue monotherapy going forward.   She has been doing well and recently had cataract surgery in both eyes.  She has been able to enjoy reading more with improved vision.  Her husband is anticipating knee surgery and she is having to pick up some extra responsibilities with regard to this.  She has noticed that her blood pressure is more elevated at home readings these days.  The lowest blood pressure she is recording is [id number] systolic.  We discussed adding antihypertensive therapy and monitoring for side effects.  We discussed all options including ARB as well as MRA.  She has recently seen neurology who feel she is stable from a neurologic perspective post stroke.  No more routine follow-up with cardiac  surgery.  The patient denies chest pain, chest pressure, dyspnea at rest or with exertion, palpitations, PND, orthopnea, or leg swelling. Denies cough, fever, chills. Denies nausea, vomiting. Denies syncope or presyncope. Denies dizziness or lightheadedness.     Past Medical History:  Diagnosis Date   Coronary artery disease    CVA (cerebral vascular accident) ([place])    Herpes zoster 2012   Vertigo     Past Surgical History:  Procedure Laterality Date   CARDIAC CATHETERIZATION     CORONARY ARTERY BYPASS GRAFT N/A 11/29/2019   Procedure: CORONARY ARTERY BYPASS GRAFTING (CABG) x 6 ON PUMP. USING LEFT INTERNAL MAMMARY ARTERY, LEFT RADIAL ARTERY AND RIGHT GREATER SAPHENOUS VEIN.;  Surgeon: Wonda Olds, MD;  Location: [facility];  Service: Open Heart Surgery;  Laterality: N/A;   ENDOVEIN HARVEST OF GREATER SAPHENOUS VEIN Right 11/29/2019   Procedure: ENDOVEIN HARVEST OF GREATER SAPHENOUS VEIN;  Surgeon: Wonda Olds, MD;  Location: [facility];  Service: Open Heart Surgery;  Laterality: Right;   LEFT HEART CATH AND CORONARY ANGIOGRAPHY N/A 11/23/2019   Procedure: LEFT HEART CATH AND CORONARY ANGIOGRAPHY;  Surgeon: Jettie Booze, MD;  Location: [facility];  Service: Cardiovascular;  Laterality: N/A;   RADIAL ARTERY HARVEST Left 11/29/2019   Procedure: RADIAL ARTERY HARVEST;  Surgeon: Wonda Olds, MD;  Location: [facility];  Service: Open Heart Surgery;  Laterality: Left;   RHINOPLASTY     TEE WITHOUT CARDIOVERSION N/A 11/29/2019   Procedure: TRANSESOPHAGEAL ECHOCARDIOGRAM (TEE);  Surgeon: Fredrich Romans  Z, MD;  Location: [facility];  Service: Open Heart Surgery;  Laterality: N/A;    Current Medications: Current Meds  Medication Sig   amitriptyline (ELAVIL) 10 MG tablet TAKE 1 TABLET BY MOUTH EVERYDAY AT BEDTIME   aspirin EC 81 MG tablet Take 81 mg by mouth daily. Swallow whole.   atorvastatin (LIPITOR) 80 MG tablet TAKE 1 TABLET BY MOUTH EVERY DAY   carvedilol (COREG) 6.25 MG  tablet Take 1 tablet (6.25 mg total) by mouth 2 (two) times daily.   clopidogrel (PLAVIX) 75 MG tablet Take 1 tablet (75 mg total) by mouth daily.   gabapentin (NEURONTIN) 300 MG capsule Take 900 mg by mouth at bedtime.   ketorolac (ACULAR) 0.5 % ophthalmic solution Place 1 drop into the left eye 4 (four) times daily.   Multiple Vitamin (MULTIVITAMIN ADULT PO) Take 1 tablet by mouth daily.   spironolactone (ALDACTONE) 25 MG tablet Take 1 tablet (25 mg total) by mouth daily.     Allergies:   Mango flavor and Contrast media [iodinated diagnostic agents]   Social History   Tobacco Use   Smoking status: Never   Smokeless tobacco: Never  Vaping Use   Vaping Use: Never used  Substance Use Topics   Alcohol use: No   Drug use: No     Family History: The patient's family history includes CAD (age of onset: 43) in her father; Lung cancer (age of onset: 71) in her father; Unexplained death in her mother.  ROS:   Please see the history of present illness.    All other systems reviewed and are negative.  EKGs/Labs/Other Studies Reviewed:    The following studies were reviewed today:  EKG: Sinus rhythm, PVCs, septal infarct.  Recent Labs: 11/29/2019: ALT 32 11/30/2019: Magnesium 2.7 12/04/2019: Hemoglobin 10.7; Platelets 260 01/13/2020: BUN 13; Creatinine, Ser 0.76; Potassium 4.2; Sodium 140  Recent Lipid Panel    Component Value Date/Time   CHOL 62 11/30/2019 0459   TRIG 68 11/30/2019 0459   HDL 20 (L) 11/30/2019 0459   CHOLHDL 3.1 11/30/2019 0459   VLDL 14 11/30/2019 0459   LDLCALC 28 11/30/2019 0459    Physical Exam:    VS:  BP (!) 164/88   Pulse 80   Ht '5\' 4"'$  (1.626 m)   Wt 197 lb (89.4 kg)   SpO2 97%   BMI 33.81 kg/m     Wt Readings from Last 5 Encounters:  10/09/20 197 lb (89.4 kg)  08/28/20 195 lb (88.5 kg)  03/02/20 176 lb 3.2 oz (79.9 kg)  02/28/20 174 lb (78.9 kg)  01/03/20 182 lb 6.4 oz (82.7 kg)    Constitutional: No acute distress Eyes: sclera  non-icteric, normal conjunctiva and lids ENMT: normal dentition, moist mucous membranes Cardiovascular: regular rhythm, normal rate, no murmurs. S1 and S2 normal. Radial pulses normal bilaterally. No jugular venous distention.  Respiratory: clear to auscultation bilaterally GI : normal bowel sounds, soft and nontender. No distention.   MSK: extremities warm, well perfused. No edema.  NEURO: grossly nonfocal exam, moves all extremities. PSYCH: alert and oriented x 3, normal mood and affect.    ASSESSMENT:    1. Coronary artery disease involving native coronary artery of native heart without angina pectoris   2. S/P CABG x 6   3. Primary hypertension   4. Medication management   5. Left ventricular apical aneurysm   6. Hyperlipidemia, unspecified hyperlipidemia type   7. 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  At this time she is stable and can continue aspirin 81 mg daily and carvedilol 75 mg daily. - continue carvedilol 6.25 mg twice daily and atorvastatin 80 mg daily.  Primary hypertension -Blood pressure is elevated both on home readings as well as in the office and the patient is quite concerned about this.  In addition to carvedilol 6.25 mg twice daily, we will start spironolactone 25 mg daily and plan for close follow-up for blood pressure monitoring.  Laboratory tests will be ordered to monitor renal function and potassium.  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 Can continue  plavix, does not need dapt for stroke.  Will clarify DAPT from a cardiac surgical perspective.  Total time of encounter: 30 minutes total time of encounter, including 20 minutes spent in face-to-face patient care on the date of this encounter. This time includes coordination of care and counseling regarding above mentioned problem list. Remainder of non-face-to-face time involved reviewing chart documents/testing relevant to the patient encounter and documentation in the medical record. I have independently reviewed documentation from referring provider.   Cherlynn Kaiser, MD, [facility]     Medication Adjustments/Labs and Tests Ordered: Current medicines are reviewed at length with the patient today.  Concerns regarding medicines are outlined above.   Orders Placed This Encounter  Procedures   Basic metabolic panel   EKG [id number]     Meds ordered this encounter  Medications   spironolactone (ALDACTONE) 25 MG tablet    Sig: Take 1 tablet (25 mg total) by mouth daily.    Dispense:  30 tablet    Refill:  3     Patient Instructions  Medication Instructions:  START: SPIRONOLACTONE '25mg'$  DAILY  *If you need a refill on your cardiac medications before your next appointment, please call your pharmacy*  Lab Work: PLEASE RETURN in 7-10 [place] [facility] BLOOD WORK  If you have labs (blood work) drawn today and your tests are completely normal, you will receive your results only by: [place] (if you have MyChart) OR A paper copy in the mail If you have any lab test that is abnormal or we need to change your treatment, we will call you to review the results.  Follow-Up: At [facility], you and your health needs are our priority.  As part of our continuing mission to provide you with exceptional heart care, we have created designated Provider Care Teams.  These Care Teams include your primary Cardiologist (physician) and Advanced Practice Providers  (APPs -  Physician Assistants and Nurse Practitioners) who all work together to provide you with the care you need, when you need it.  Your next appointment:   October 25th at 9:20am   The format for your next appointment:   In Person  Provider:   Cherlynn Kaiser, MD

## 2020-10-22 ENCOUNTER — Other Ambulatory Visit: Payer: Self-pay | Admitting: Internal Medicine

## 2020-10-23 DIAGNOSIS — Z79899 Other long term (current) drug therapy: Secondary | ICD-10-CM | POA: Diagnosis not present

## 2020-10-24 LAB — BASIC METABOLIC PANEL
BUN/Creatinine Ratio: 21 (ref 12–28)
BUN: 16 mg/dL (ref 8–27)
CO2: 24 mmol/L (ref 20–29)
Calcium: 10 mg/dL (ref 8.7–10.3)
Chloride: 101 mmol/L (ref 96–106)
Creatinine, Ser: 0.77 mg/dL (ref 0.57–1.00)
Glucose: 95 mg/dL (ref 70–99)
Potassium: 5.4 mmol/L — ABNORMAL HIGH (ref 3.5–5.2)
Sodium: 139 mmol/L (ref 134–144)
eGFR: 79 mL/min/{1.73_m2} (ref >59)

## 2020-10-27 ENCOUNTER — Other Ambulatory Visit: Payer: Self-pay

## 2020-10-27 ENCOUNTER — Telehealth: Payer: Self-pay | Admitting: Internal Medicine

## 2020-10-27 DIAGNOSIS — Z79899 Other long term (current) drug therapy: Secondary | ICD-10-CM

## 2020-10-27 DIAGNOSIS — R931 Abnormal findings on diagnostic imaging of heart and coronary circulation: Secondary | ICD-10-CM

## 2020-10-27 NOTE — Telephone Encounter (Signed)
Patient states she is returning call to [place].

## 2020-10-27 NOTE — Telephone Encounter (Signed)
Returned call to patient, made patient aware of the following recommendations.   Jamie Munroe, MD  10/27/2020 11:21 AM EDT     K is mildly elevated on spironolactone, but not in worrisome range. Let's recheck it right before her next follow up with me. Hope BP is better.    Patient aware and will have labs drawn right before next appointment with Dr. Margaretann Hodge on 10/25. Orders have been placed. Patient states that also her blood pressure has been much better and has been running on average around 129/70. Patient also states that she feels much better as well. Advised patient I would forward message to Dr. Margaretann Hodge to make her aware. Patient verbalized understanding.

## 2020-11-09 ENCOUNTER — Other Ambulatory Visit: Payer: Self-pay

## 2020-11-09 DIAGNOSIS — Z79899 Other long term (current) drug therapy: Secondary | ICD-10-CM | POA: Diagnosis not present

## 2020-11-10 LAB — BASIC METABOLIC PANEL
BUN/Creatinine Ratio: 17 (ref 12–28)
BUN: 15 mg/dL (ref 8–27)
CO2: 25 mmol/L (ref 20–29)
Calcium: 9.5 mg/dL (ref 8.7–10.3)
Chloride: 102 mmol/L (ref 96–106)
Creatinine, Ser: 0.88 mg/dL (ref 0.57–1.00)
Glucose: 107 mg/dL — ABNORMAL HIGH (ref 70–99)
Potassium: 5.3 mmol/L — ABNORMAL HIGH (ref 3.5–5.2)
Sodium: 139 mmol/L (ref 134–144)
eGFR: 67 mL/min/{1.73_m2} (ref >59)

## 2020-11-14 ENCOUNTER — Encounter: Payer: Self-pay | Admitting: Internal Medicine

## 2020-11-14 ENCOUNTER — Ambulatory Visit: Payer: Medicare Other | Admitting: Internal Medicine

## 2020-11-14 ENCOUNTER — Other Ambulatory Visit: Payer: Self-pay

## 2020-11-14 VITALS — BP 140/82 | HR 95 | Ht 64.0 in | Wt 201.8 lb

## 2020-11-14 DIAGNOSIS — Z79899 Other long term (current) drug therapy: Secondary | ICD-10-CM | POA: Diagnosis not present

## 2020-11-14 DIAGNOSIS — I10 Essential (primary) hypertension: Secondary | ICD-10-CM

## 2020-11-14 DIAGNOSIS — Z951 Presence of aortocoronary bypass graft: Secondary | ICD-10-CM | POA: Diagnosis not present

## 2020-11-14 DIAGNOSIS — I2510 Atherosclerotic heart disease of native coronary artery without angina pectoris: Secondary | ICD-10-CM | POA: Diagnosis not present

## 2020-11-14 DIAGNOSIS — I253 Aneurysm of heart: Secondary | ICD-10-CM | POA: Diagnosis not present

## 2020-11-14 DIAGNOSIS — I63342 Cerebral infarction due to thrombosis of left cerebellar artery: Secondary | ICD-10-CM | POA: Diagnosis not present

## 2020-11-14 DIAGNOSIS — E785 Hyperlipidemia, unspecified: Secondary | ICD-10-CM | POA: Diagnosis not present

## 2020-11-14 MED ORDER — VALSARTAN 80 MG PO TABS: 80.0000 mg | ORAL_TABLET | Freq: Every day | ORAL | 6 refills | Status: DC

## 2020-11-14 NOTE — Progress Notes (Addendum)
Cardiology Office Note:    Date:  11/14/2020   ID:  Jamie Hodge, DOB December 15, 1940, MRN [id number]  PCP:  [facility], [place]  Cardiologist:  Elouise Munroe, MD  Electrophysiologist:  None   Referring MD: [facility]*   Chief Complaint/Reason for Referral: CAD s/p CABG  History of Present Illness:    Jamie Hodge is a 80 y.o. 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 CCTA showed severe CAD, and after cath she was referred for CABG for multivessel CAD. She underwent CABG on 11/29/19. Did not tolerate carvedilol 12.5 mg BID, dose reduced to 6.25 mg BID. She is otherwise doing well. Stopped aspirin due to nosebleeds per neurology notes. Nosebleeding subsided with use of teabag. She is now back on aspirin and continues to take Plavix for dual antiplatelet therapy.  Per Dr. Orvan Seen last note, she is to continue Plavix indefinitely, though somewhat unclear if aspirin was intended to be continued as well.  We have continued her on DAPT with no significant issues.  She has been doing well and recently had cataract surgery in both eyes.  She has been able to enjoy reading more with improved vision.  Her husband is anticipating knee surgery after thanksgiving and she is having to pick up some extra responsibilities with regard to this.  She has noticed that her blood pressure is more elevated at home readings these days.  The lowest blood pressure she is recording is 409W systolic.  We discussed adding antihypertensive therapy and monitoring for side effects.  We discussed all options including ARB as well as MRA. We started spironolactone at our last visit and she has felt better, with better BP control. However potassium was mildly elevated at 5.4 and subsequently 5.3, and we discussed options of staying on spironolactone and  reducing potassium in diet or transitioning to an alternate agent.  She has recently seen neurology who feel she is stable from a neurologic perspective post stroke.  No more routine follow-up with cardiac surgery.  The patient denies chest pain, chest pressure, dyspnea at rest or with exertion, palpitations, PND, orthopnea, or leg swelling. Denies cough, fever, chills. Denies nausea, vomiting. Denies syncope or presyncope. Denies dizziness or lightheadedness.     Past Medical History:  Diagnosis Date   Coronary artery disease    CVA (cerebral vascular accident) ([place])    Herpes zoster 2012   Vertigo     Past Surgical History:  Procedure Laterality Date   CARDIAC CATHETERIZATION     CORONARY ARTERY BYPASS GRAFT N/A 11/29/2019   Procedure: CORONARY ARTERY BYPASS GRAFTING (CABG) x 6 ON PUMP. USING LEFT INTERNAL MAMMARY ARTERY, LEFT RADIAL ARTERY AND RIGHT GREATER SAPHENOUS VEIN.;  Surgeon: Wonda Olds, MD;  Location: [facility];  Service: Open Heart Surgery;  Laterality: N/A;   ENDOVEIN HARVEST OF GREATER SAPHENOUS VEIN Right 11/29/2019   Procedure: ENDOVEIN HARVEST OF GREATER SAPHENOUS VEIN;  Surgeon: Wonda Olds, MD;  Location: [facility];  Service: Open Heart Surgery;  Laterality: Right;   LEFT HEART CATH AND CORONARY ANGIOGRAPHY N/A 11/23/2019   Procedure: LEFT HEART CATH AND CORONARY ANGIOGRAPHY;  Surgeon: Jettie Booze, MD;  Location: [facility];  Service: Cardiovascular;  Laterality: N/A;   RADIAL ARTERY HARVEST Left 11/29/2019   Procedure: RADIAL ARTERY HARVEST;  Surgeon: Wonda Olds, MD;  Location: [facility];  Service: Open Heart Surgery;  Laterality: Left;   RHINOPLASTY     TEE WITHOUT CARDIOVERSION N/A 11/29/2019   Procedure: TRANSESOPHAGEAL ECHOCARDIOGRAM (TEE);  Surgeon: Wonda Olds, MD;  Location: [place];  Service: Open Heart Surgery;  Laterality: N/A;    Current Medications: Current Meds  Medication Sig   amitriptyline (ELAVIL) 10 MG tablet TAKE 1  TABLET BY MOUTH EVERYDAY AT BEDTIME   aspirin EC 81 MG tablet Take 81 mg by mouth daily. Swallow whole.   atorvastatin (LIPITOR) 80 MG tablet TAKE 1 TABLET BY MOUTH EVERY DAY   carvedilol (COREG) 6.25 MG tablet Take 1 tablet (6.25 mg total) by mouth 2 (two) times daily.   clopidogrel (PLAVIX) 75 MG tablet Take 1 tablet (75 mg total) by mouth daily.   gabapentin (NEURONTIN) 300 MG capsule Take 900 mg by mouth at bedtime.   Multiple Vitamin (MULTIVITAMIN ADULT PO) Take 1 tablet by mouth daily.   spironolactone (ALDACTONE) 25 MG tablet Take 1 tablet (25 mg total) by mouth daily.     Allergies:   Mango flavor and Contrast media [iodinated diagnostic agents]   Social History   Tobacco Use   Smoking status: Never   Smokeless tobacco: Never  Vaping Use   Vaping Use: Never used  Substance Use Topics   Alcohol use: No   Drug use: No     Family History: The patient's family history includes CAD (age of onset: 60) in her father; Lung cancer (age of onset: 59) in her father; Unexplained death in her mother.  ROS:   Please see the history of present illness.    All other systems reviewed and are negative.  EKGs/Labs/Other Studies Reviewed:    The following studies were reviewed today:  EKG: today - no ecg obtained. Last visit: Sinus rhythm, PVCs, septal infarct.  Recent Labs: 11/29/2019: ALT 32 11/30/2019: Magnesium 2.7 12/04/2019: Hemoglobin 10.7; Platelets 260 11/09/2020: BUN 15; Creatinine, Ser 0.88; Potassium 5.3; Sodium 139  Recent Lipid Panel    Component Value Date/Time   CHOL 62 11/30/2019 0459   TRIG 68 11/30/2019 0459   HDL 20 (L) 11/30/2019 0459   CHOLHDL 3.1 11/30/2019 0459   VLDL 14 11/30/2019 0459   LDLCALC 28 11/30/2019 0459    Physical Exam:    VS:  BP 140/82   Pulse 95   Ht 5\' 4"  (1.626 m)   Wt 201 lb 12.8 oz (91.5 kg)   SpO2 93%   BMI 34.64 kg/m     Wt Readings from Last 5 Encounters:  11/14/20 201 lb 12.8 oz (91.5 kg)  10/09/20 197 lb (89.4 kg)   08/28/20 195 lb (88.5 kg)  03/02/20 176 lb 3.2 oz (79.9 kg)  02/28/20 174 lb (78.9 kg)    Constitutional: No acute distress Eyes: sclera non-icteric, normal conjunctiva and lids ENMT: normal dentition, moist mucous membranes Cardiovascular: regular rhythm, normal rate, no murmurs. S1 and S2 normal. Radial pulses normal bilaterally. No jugular venous distention.  Respiratory: clear to auscultation bilaterally GI : normal bowel sounds, soft and nontender. No distention.   MSK: extremities warm, well perfused. No edema.  NEURO: grossly nonfocal exam, moves all extremities. PSYCH: alert and oriented x 3, normal mood and affect.    ASSESSMENT:    1. Medication management   2. Coronary artery disease involving native coronary artery of native heart without angina pectoris   3. S/P CABG x 6   4. Primary hypertension   5. Left ventricular apical aneurysm   6.  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 Hyperlipidemia, unspecified hyperlipidemia type      PLAN:    Coronary artery disease involving native coronary artery of native heart without angina pectoris Left ventricular apical aneurysm S/P CABG x 6  - recommend indefinite DAPT per TCTS, however I will clarify whether the intent was for Plavix monotherapy versus both Plavix and aspirin.  At this time she is stable and can continue aspirin 81 mg daily and plavix 75 mg daily. - continue carvedilol 6.25 mg twice daily and atorvastatin 80 mg daily.  Primary hypertension Medication management -Blood pressure is better controlled with both BB and spironolactone, however postassium mildly elevated. She will transition to valsartan 80 mg daily and we will recheck lab work. She will share BP log at next phone check in. Stop spironolactone for now.  Hyperlipidemia, unspecified hyperlipidemia type - continue lipitor 80 mg daily  Cerebrovascular accident (CVA) due to thrombosis of left cerebellar  artery ([place]) - recovered per neurology. Can continue plavix, does not need dapt for stroke.  Will clarify DAPT from a cardiac surgical perspective.  Total time of encounter: 30 minutes total time of encounter, including 25 minutes spent in face-to-face patient care on the date of this encounter. This time includes coordination of care and counseling regarding above mentioned problem list. Remainder of non-face-to-face time involved reviewing chart documents/testing relevant to the patient encounter and documentation in the medical record. I have independently reviewed documentation from referring provider.   Cherlynn Kaiser, MD, [facility]      Medication Adjustments/Labs and Tests Ordered: Current medicines are reviewed at length with the patient today.  Concerns regarding medicines are outlined above.   Orders Placed This Encounter  Procedures   Basic metabolic panel      Meds ordered this encounter  Medications   valsartan (DIOVAN) 80 MG tablet    Sig: Take 1 tablet (80 mg total) by mouth daily.    Dispense:  30 tablet    Refill:  6      Patient Instructions  Medication Instructions:  STOP: SPIRONOLACTONE  START: VALSARTAN 80mg  ONCE DAILY ON Saturday 11/18/20 *If you need a refill on your cardiac medications before your next appointment, please call your pharmacy*  Lab Work: [place] BMET AROUND 11/14. (2 WEEKS AFTER STARTING VALSARTAN) If you have labs (blood work) drawn today and your tests are completely normal, you will receive your results only by: [place] (if you have MyChart) OR A paper copy in the mail If you have any lab test that is abnormal or we need to change your treatment, we will call you to review the results.  Follow-Up: At [facility], you and your health needs are our priority.  As part of our continuing mission to provide you with exceptional heart care, we have created designated Provider Care  Teams.  These Care Teams include your primary Cardiologist (physician) and Advanced Practice Providers (APPs -  Physician Assistants and Nurse Practitioners) who all work together to provide you with the care you need, when you need it.  Your next appointment:   6 month(s)  The format for your next appointment:   In Person  Provider:   Cherlynn Kaiser, MD  Other Instructions PLEASE SEND [place] YOUR BLOOD PRESSURE READINGS IN 2 WEEKS.    Addendum to Plan made 05/22/21 due to recognition of typo affecting medication name.

## 2020-11-14 NOTE — Patient Instructions (Signed)
Medication Instructions:  STOP: SPIRONOLACTONE  START: VALSARTAN 80mg  ONCE DAILY ON Saturday 11/18/20 *If you need a refill on your cardiac medications before your next appointment, please call your pharmacy*  Lab Work: [place] BMET AROUND 11/14. (2 WEEKS AFTER STARTING VALSARTAN) If you have labs (blood work) drawn today and your tests are completely normal, you will receive your results only by: [place] (if you have MyChart) OR A paper copy in the mail If you have any lab test that is abnormal or we need to change your treatment, we will call you to review the results.  Follow-Up: At [facility], you and your health needs are our priority.  As part of our continuing mission to provide you with exceptional heart care, we have created designated Provider Care Teams.  These Care Teams include your primary Cardiologist (physician) and Advanced Practice Providers (APPs -  Physician Assistants and Nurse Practitioners) who all work together to provide you with the care you need, when you need it.  Your next appointment:   6 month(s)  The format for your next appointment:   In Person  Provider:   Cherlynn Kaiser, MD  Other Instructions PLEASE SEND [place] YOUR BLOOD PRESSURE READINGS IN 2 WEEKS.

## 2020-11-22 MED ORDER — CLOPIDOGREL BISULFATE 75 MG PO TABS
75.0000 mg | ORAL_TABLET | Freq: Every day | ORAL | 3 refills | Status: DC
Start: 2020-11-22 — End: 2021-02-26

## 2020-12-07 DIAGNOSIS — Z79899 Other long term (current) drug therapy: Secondary | ICD-10-CM | POA: Diagnosis not present

## 2020-12-08 LAB — BASIC METABOLIC PANEL
BUN/Creatinine Ratio: 20 (ref 12–28)
BUN: 17 mg/dL (ref 8–27)
CO2: 24 mmol/L (ref 20–29)
Calcium: 10.2 mg/dL (ref 8.7–10.3)
Chloride: 103 mmol/L (ref 96–106)
Creatinine, Ser: 0.83 mg/dL (ref 0.57–1.00)
Glucose: 92 mg/dL (ref 70–99)
Potassium: 5.2 mmol/L (ref 3.5–5.2)
Sodium: 141 mmol/L (ref 134–144)
eGFR: 72 mL/min/{1.73_m2} (ref >59)

## 2020-12-11 MED ORDER — CARVEDILOL 6.25 MG PO TABS
6.2500 mg | ORAL_TABLET | Freq: Two times a day (BID) | ORAL | 11 refills | Status: DC
Start: 2020-12-11 — End: 2021-12-05

## 2020-12-19 DIAGNOSIS — E782 Mixed hyperlipidemia: Secondary | ICD-10-CM | POA: Diagnosis not present

## 2020-12-19 DIAGNOSIS — U071 COVID-19: Secondary | ICD-10-CM | POA: Diagnosis not present

## 2020-12-19 DIAGNOSIS — I639 Cerebral infarction, unspecified: Secondary | ICD-10-CM | POA: Diagnosis not present

## 2020-12-19 DIAGNOSIS — I2510 Atherosclerotic heart disease of native coronary artery without angina pectoris: Secondary | ICD-10-CM | POA: Diagnosis not present

## 2020-12-19 DIAGNOSIS — I10 Essential (primary) hypertension: Secondary | ICD-10-CM | POA: Diagnosis not present

## 2021-01-01 ENCOUNTER — Ambulatory Visit: Payer: Medicare Other | Admitting: Adult Health

## 2021-01-30 DIAGNOSIS — H353132 Nonexudative age-related macular degeneration, bilateral, intermediate dry stage: Secondary | ICD-10-CM | POA: Diagnosis not present

## 2021-02-21 ENCOUNTER — Ambulatory Visit: Payer: Medicare Other | Admitting: Adult Health

## 2021-02-21 ENCOUNTER — Encounter: Payer: Self-pay | Admitting: Adult Health

## 2021-02-21 ENCOUNTER — Other Ambulatory Visit: Payer: Self-pay | Admitting: Internal Medicine

## 2021-02-21 VITALS — BP 154/78 | HR 88 | Ht 64.0 in | Wt 201.0 lb

## 2021-02-21 DIAGNOSIS — I639 Cerebral infarction, unspecified: Secondary | ICD-10-CM

## 2021-02-21 DIAGNOSIS — B0229 Other postherpetic nervous system involvement: Secondary | ICD-10-CM

## 2021-02-21 NOTE — Progress Notes (Signed)
[facility] [address] [place]. [place] [address] [phone]       STROKE FOLLOW UP NOTE  Ms. JOHNASIA LIESE Date of Birth:  February 22, 1940 Medical Record Number:  [id number]   Reason for Referral: stroke follow up    SUBJECTIVE:   CHIEF COMPLAINT:  Chief Complaint  Patient presents with   Follow-up    RM 3 alone Pt is well and stable. No new concerns     HPI:   Update 02/21/2021 JM: Returns for 39-month stroke and postherpetic neuralgia follow-up.  Overall doing well.  Denies new or reoccurring stroke/TIA symptoms.  Compliant on all prescribed medications.  Blood pressure today 154/78. Routinely monitors at home typically stable around 120-130s/70s. Postherpetic neuralgia greatly improved since starting amitriptyline 10 mg daily in addition to gabapentin 900mg  nightly. She will have occasional flareups but tolerable.  Is scheduled for bilateral cataract surgery in 08/2021.  Routinely followed by PCP.  No further concerns at this time.    History provided for reference purposes only Update 08/28/2020 JM: Ms. Henderson returns for 9-month stroke follow-up.  Overall doing well.  Denies new or reoccurring stroke/TIA symptoms.  Remains on aspirin and Plavix as well as atorvastatin without associated side effects.  Prior concern of epistaxis at prior visit with d/c'ing aspirin - she has since restarted as advised by cardiology without any reoccurring issues.  blood pressure today 122/74.  She remains on gabapentin for post shingles neuralgia currently taking 1200mg  nightly (increased by PCP) over the past month but does wake up with day time fatigue. Previously taking 600mg  nightly and increased straight to 1200mg  nightly. Was tolerating 600mg  nightly without side effects but no benefit. Gabapentin initially working well but eventually started to experience worsening pain.  She does report intermittent flareups worse at night but no specific interval.  Reports trialing  patches (both OTC and RX) in the past but only limited benefit.  Plans to undergo cataract surgery Wednesday in left eye and in 2 weeks right eye.  No further concerns at this  Update 02/28/2020 JM: Ms. Michels returns for 11-month stroke follow-up.  She has been doing well from a stroke standpoint denying residual deficits or new stroke/TIA symptoms. 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  She was discharged on aspirin and Plavix but self discontinued aspirin approximately 3 weeks ago due to severe nosebleed reporting large clots from both sides.  This lasted for approximately 1 hr and eventually subsided after use of a teabag.  She has not had any additional nosebleeds or any other bleeding or bruising concerns and currently on Plavix alone.  She has follow-up visit scheduled cardiology this Thursday and plans on further discussing.  She has remained on atorvastatin 80 mg daily without myalgias.  Prior lipid panel 11/2019 showed LDL 28 (down from 258 09/2019!!).  Blood pressure today elevated and reports fluctuations at home with plans on further discussing further management with cardiology on Thursday.  Prior history of shingles with resultant postherpetic neuralgia which has not been present since mid fall 2021 but recently neuralgia symptoms restarted located left T10-L1 nerve distribution.  This has been currently interfering with daily activity as well as adequate sleep.  No further concerns at this time.  Initial visit 10/28/2019 JM: Ms. Desmith is being seen for hospital follow up. She reports since discharge she has been experiencing excessive anxiety, headache, intermittent right leg numbness, vertigo and tinnitus bilaterally. No prior history of anxiety or depression. Chronic history of headaches  but since stroke, has been experiencing generalized headaches more frequently.  Reports intermittent right leg numbness is not new since hospitalization.  Tinnitus and vertigo  chronic condition.  She believes onset of vertigo and headache typically associated with increased anxiety.  Plans to establish care with PCP next week. Has scheduled visit with cardiology in 1.5 weeks.  Completed 3 weeks DAPT and remains on aspirin without bleeding or bruising.  Previously on atorvastatin 80 mg daily without myalgias but completed 30-day prescription provided at discharge and does not have PCP at this time for refill.  Blood pressure today 154/79.  She does not routinely monitor at home.  No further concerns at this time.  Stroke admission 09/23/2019 Ms. Shaquayla Klimas is a 81 y.o. female with history of vertigo  who presented on 09/23/2019 with dizziness and right arm weakness.  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  Recommended DAPT for 3 weeks then aspirin alone.  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  LDL 258 and initiate atorvastatin 80 mg daily.  No prior history of HTN or DM.  Other stroke risk factors include advanced age and obesity but no prior stroke history.  Evaluated by therapy and discharged home in stable condition without therapy needs.  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  CT head - No evidence of acute intracranial abnormality. MRI head - Small subacute left cerebellar infarct. Mild chronic small vessel ischemic disease.  CTA H&N -  No emergent large vessel occlusion. 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  2D Echo EF 65 to 70%, apical akinesis with probable aneurysm formation, no LV apical thrombus [employer] Virus 2 - negative LDL - 258 HgbA1c - 5.8 VTE prophylaxis - Lovenox / SCDs No antithrombotic prior to admission, now on aspirin 81 mg daily and  clopidogrel 75 mg daily DAPT for 3 weeks and then aspirin alone. Patient counseled to be compliant with her antithrombotic medications Ongoing aggressive stroke risk factor management Therapy recommendations:  None Disposition: Home today         ROS:   14 system review of systems performed and negative with exception of those listed in HPI  PMH:  Past Medical History:  Diagnosis Date   Coronary artery disease    CVA (cerebral vascular accident) ([place])    Herpes zoster 2012   Vertigo     PSH:  Past Surgical History:  Procedure Laterality Date   CARDIAC CATHETERIZATION     CORONARY ARTERY BYPASS GRAFT N/A 11/29/2019   Procedure: CORONARY ARTERY BYPASS GRAFTING (CABG) x 6 ON PUMP. USING LEFT INTERNAL MAMMARY ARTERY, LEFT RADIAL ARTERY AND RIGHT GREATER SAPHENOUS VEIN.;  Surgeon: Wonda Olds, MD;  Location: [place];  Service: Open Heart Surgery;  Laterality: N/A;   ENDOVEIN HARVEST OF GREATER SAPHENOUS VEIN Right 11/29/2019   Procedure: ENDOVEIN HARVEST OF GREATER SAPHENOUS VEIN;  Surgeon: Wonda Olds, MD;  Location: [place];  Service: Open Heart Surgery;  Laterality: Right;   LEFT HEART CATH AND CORONARY ANGIOGRAPHY N/A 11/23/2019   Procedure: LEFT HEART CATH AND CORONARY ANGIOGRAPHY;  Surgeon: Jettie Booze, MD;  Location: [place] [facility];  Service: Cardiovascular;  Laterality: N/A;   RADIAL ARTERY HARVEST Left 11/29/2019   Procedure: RADIAL ARTERY HARVEST;  Surgeon: Wonda Olds, MD;  Location: [place];  Service: Open Heart Surgery;  Laterality: Left;   RHINOPLASTY     TEE WITHOUT CARDIOVERSION N/A 11/29/2019   Procedure: TRANSESOPHAGEAL ECHOCARDIOGRAM (TEE);  Surgeon: Wonda Olds, MD;  Location: [place];  Service: Open Heart Surgery;  Laterality: N/A;    Social History:  Social History   Socioeconomic History   Marital status: Married    Spouse name: Not on file   Number of children: Not on file   Years of education: Not on file   Highest  education level: Not on file  Occupational History   Not on file  Tobacco Use   Smoking status: Never   Smokeless tobacco: Never  Vaping Use   Vaping Use: Never used  Substance and Sexual Activity   Alcohol use: No   Drug use: No   Sexual activity: Never    Birth control/protection: None  Other Topics Concern   Not on file  Social History Narrative   Not on file   Social Determinants of Health   Financial Resource Strain: Not on file  Food Insecurity: Not on file  Transportation Needs: Not on file  Physical Activity: Not on file  Stress: Not on file  Social Connections: Not on file  Intimate Partner Violence: Not on file    Family History:  Family History  Problem Relation Age of Onset   Unexplained death Mother        Died at the age of 60, no autopsy performed   CAD Father 20   Lung cancer Father 70    Medications:   Current Outpatient Medications on File Prior to Visit  Medication Sig Dispense Refill   amitriptyline (ELAVIL) 10 MG tablet TAKE 1 TABLET BY MOUTH EVERYDAY AT BEDTIME 90 tablet 3   aspirin EC 81 MG tablet Take 81 mg by mouth daily. Swallow whole.     atorvastatin (LIPITOR) 80 MG tablet TAKE 1 TABLET BY MOUTH EVERY DAY 90 tablet 3   carvedilol (COREG) 6.25 MG tablet Take 1 tablet (6.25 mg total) by mouth 2 (two) times daily. 60 tablet 11   clopidogrel (PLAVIX) 75 MG tablet Take 1 tablet (75 mg total) by mouth daily. 30 tablet 3   gabapentin (NEURONTIN) 300 MG capsule Take 900 mg by mouth at bedtime.     Multiple Vitamin (MULTIVITAMIN ADULT PO) Take 1 tablet by mouth daily.     valsartan (DIOVAN) 80 MG tablet Take 1 tablet (80 mg total) by mouth daily. 30 tablet 6   [DISCONTINUED] isosorbide mononitrate (IMDUR) 30 MG 24 hr tablet Take 1 tablet (30 mg total) by mouth daily. 30 tablet 0   No current facility-administered medications on file prior to visit.    Allergies:   Allergies  Allergen Reactions   Mango Flavor Anaphylaxis   Contrast Media  [Iodinated Contrast Media] Other (See Comments)    Unknown reaction      OBJECTIVE:  Physical Exam  Vitals:   02/21/21 1507  BP: (!) 154/78  Pulse: 88  Weight: 201 lb (91.2 kg)  Height: 5\' 4"  (1.626 m)   Body mass index is 34.5 kg/m. No results found.  General: well developed, well nourished, very pleasant elderly Caucasian female, seated, in no evident distress Head: head normocephalic and atraumatic.   Neck: supple with no carotid or supraclavicular bruits Cardiovascular: regular rate and rhythm, no murmurs Musculoskeletal: no deformity Skin:  no rash/petichiae Vascular:  Normal pulses all extremities   Neurologic Exam  Mental Status: Awake and fully alert. Fluent speech and language. Oriented to place and time. Recent and remote memory intact. Attention span, concentration and fund of knowledge appropriate. Mood and affect appropriate.  Cranial Nerves: Pupils equal, briskly reactive to light. Extraocular movements full without nystagmus. Visual fields full to confrontation. Hearing intact. Facial sensation intact. Face, tongue, palate moves normally and symmetrically.  Motor: Normal bulk and tone. Normal strength in all tested extremity muscles. Sensory.: intact to touch , pinprick , position and vibratory sensation.  PHN left L1 nerve root Coordination: Rapid alternating movements normal in all extremities. Finger-to-nose and heel-to-shin performed accurately bilaterally. Gait and Station: Arises from chair without difficulty. Stance is normal. Gait demonstrates normal stride length and balance without use of assistive device Reflexes: 1+ and symmetric. Toes downgoing.         ASSESSMENT: JACKELINE GUTKNECHT is a 80 y.o. year old female presented with dizziness and right arm weakness on 09/23/2019 with stroke work-up revealing small subacute left cerebellar infarct secondary to small vessel disease. Vascular risk factors include HLD, severe multivessel CAD s/p CABG  11/2019, advanced age and obesity.      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   Excellent improvement since starting amitriptyline 10 mg daily and continued gabapentin 900 mg nightly. Discussed trial of lowering gabapentin dose to 600 mg nightly but to resume 900mg  dose if symptoms worsen.  Request ongoing prescribing and management to PCP but if unable, can follow back up in 1 year for medication management   Stable from stroke standpoint without further recommendations.  Follow-up as needed or as stated above    CC:  [place], [place]    I spent 32 minutes of face-to-face and non-face-to-face time with patient.  This included previsit chart review, lab review, study review, electronic health record documentation, patient education regarding prior stroke including etiology, secondary stroke prevention measures and importance of managing stroke risk factors, postherpetic neuralgia and therapeutic treatment options and answered all other questions to patient satisfaction   Frann Rider, [facility]  [facility] [address] [place], [place] [address]  Phone [phone] Fax [phone] Note: This document was prepared with digital dictation and possible smart phrase technology. Any transcriptional errors that result from this process are unintentional.

## 2021-02-21 NOTE — Patient Instructions (Addendum)
Continue aspirin 81 mg daily and clopidogrel 75 mg daily  and atorvastatin for secondary stroke prevention and cardiac history  Continue gabapentin and amitriptyline for shingles nerve pain   Continue to follow up with PCP regarding cholesterol and blood pressure management  Maintain strict control of hypertension with blood pressure goal below 130/90 and cholesterol with LDL cholesterol (bad cholesterol) goal below 70 mg/dL.   Signs of a Stroke? Follow the BEFAST method:  Balance Watch for a sudden loss of balance, trouble with coordination or vertigo Eyes Is there a sudden loss of vision in one or both eyes? Or double vision?  Face: Ask the person to smile. Does one side of the face droop or is it numb?  Arms: Ask the person to raise both arms. Does one arm drift downward? Is there weakness or numbness of a leg? Speech: Ask the person to repeat a simple phrase. Does the speech sound slurred/strange? Is the person confused ? Time: If you observe any of these signs, call 911.       Thank you for coming to see [place] at [facility]. I hope we have been able to provide you high quality care today.  You may receive a patient satisfaction survey over the next few weeks. We would appreciate your feedback and comments so that we may continue to improve ourselves and the health of our patients.

## 2021-02-23 ENCOUNTER — Encounter: Payer: Self-pay | Admitting: Internal Medicine

## 2021-02-26 MED ORDER — CLOPIDOGREL BISULFATE 75 MG PO TABS: 75.0000 mg | ORAL_TABLET | Freq: Every day | ORAL | 2 refills | Status: DC

## 2021-03-12 ENCOUNTER — Encounter: Payer: Self-pay | Admitting: Internal Medicine

## 2021-03-12 DIAGNOSIS — Z79899 Other long term (current) drug therapy: Secondary | ICD-10-CM

## 2021-03-12 MED ORDER — VALSARTAN 160 MG PO TABS: 160.0000 mg | ORAL_TABLET | Freq: Every day | ORAL | 0 refills | Status: DC

## 2021-03-12 NOTE — Telephone Encounter (Signed)
Have her increase valsartan to 160 mg daily and repeat BMET in 10-14 days.  And re-assure her that although the readings are higher than we like, she is not yet to any dangerous levels.

## 2021-03-19 DIAGNOSIS — B0229 Other postherpetic nervous system involvement: Secondary | ICD-10-CM | POA: Diagnosis not present

## 2021-03-19 DIAGNOSIS — Z23 Encounter for immunization: Secondary | ICD-10-CM | POA: Diagnosis not present

## 2021-03-19 DIAGNOSIS — Z951 Presence of aortocoronary bypass graft: Secondary | ICD-10-CM | POA: Diagnosis not present

## 2021-03-19 DIAGNOSIS — I700 Atherosclerosis of aorta: Secondary | ICD-10-CM | POA: Diagnosis not present

## 2021-03-19 DIAGNOSIS — I10 Essential (primary) hypertension: Secondary | ICD-10-CM | POA: Diagnosis not present

## 2021-03-19 DIAGNOSIS — Z0000 Encounter for general adult medical examination without abnormal findings: Secondary | ICD-10-CM | POA: Diagnosis not present

## 2021-03-19 DIAGNOSIS — I4811 Longstanding persistent atrial fibrillation: Secondary | ICD-10-CM | POA: Diagnosis not present

## 2021-03-19 DIAGNOSIS — E782 Mixed hyperlipidemia: Secondary | ICD-10-CM | POA: Diagnosis not present

## 2021-03-19 DIAGNOSIS — I2510 Atherosclerotic heart disease of native coronary artery without angina pectoris: Secondary | ICD-10-CM | POA: Diagnosis not present

## 2021-03-19 DIAGNOSIS — G43009 Migraine without aura, not intractable, without status migrainosus: Secondary | ICD-10-CM | POA: Diagnosis not present

## 2021-03-19 DIAGNOSIS — Z8673 Personal history of transient ischemic attack (TIA), and cerebral infarction without residual deficits: Secondary | ICD-10-CM | POA: Diagnosis not present

## 2021-03-29 ENCOUNTER — Other Ambulatory Visit: Payer: Self-pay

## 2021-03-29 DIAGNOSIS — Z79899 Other long term (current) drug therapy: Secondary | ICD-10-CM | POA: Diagnosis not present

## 2021-03-30 LAB — BASIC METABOLIC PANEL
BUN/Creatinine Ratio: 16 (ref 12–28)
BUN: 14 mg/dL (ref 8–27)
CO2: 23 mmol/L (ref 20–29)
Calcium: 9.4 mg/dL (ref 8.7–10.3)
Chloride: 105 mmol/L (ref 96–106)
Creatinine, Ser: 0.9 mg/dL (ref 0.57–1.00)
Glucose: 91 mg/dL (ref 70–99)
Potassium: 5.4 mmol/L — ABNORMAL HIGH (ref 3.5–5.2)
Sodium: 143 mmol/L (ref 134–144)
eGFR: 65 mL/min/{1.73_m2} (ref >59)

## 2021-05-15 ENCOUNTER — Encounter: Payer: Self-pay | Admitting: Internal Medicine

## 2021-05-15 ENCOUNTER — Ambulatory Visit: Payer: Medicare Other | Admitting: Internal Medicine

## 2021-05-15 VITALS — BP 132/58 | HR 84 | Resp 94 | Ht 64.0 in | Wt 196.0 lb

## 2021-05-15 DIAGNOSIS — I2510 Atherosclerotic heart disease of native coronary artery without angina pectoris: Secondary | ICD-10-CM

## 2021-05-15 DIAGNOSIS — Z951 Presence of aortocoronary bypass graft: Secondary | ICD-10-CM

## 2021-05-15 DIAGNOSIS — I253 Aneurysm of heart: Secondary | ICD-10-CM

## 2021-05-15 DIAGNOSIS — I447 Left bundle-branch block, unspecified: Secondary | ICD-10-CM | POA: Diagnosis not present

## 2021-05-15 NOTE — Progress Notes (Signed)
?Cardiology Office Note:   ? ?Date:  05/15/2021  ? ?ID:  Jamie Hodge, DOB Feb 25, 1940, MRN [id number] ? ?PCP:  [facility] [place]  ?Cardiologist:  Elouise Munroe, MD  ?Electrophysiologist:  None  ? ?Referring MD: [facility]*  ? ?Chief Complaint/Reason for Referral: ?CAD s/p CABG ? ?History of Present Illness:   ? ?Jamie Hodge is a 81 y.o. 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 CCTA showed severe CAD, and after cath she was referred for CABG for multivessel CAD. She underwent CABG on 11/29/19. Did not tolerate carvedilol 12.5 mg BID, dose reduced to 6.25 mg BID. She is otherwise doing well. Stopped aspirin due to nosebleeds per neurology notes. Nosebleeding subsided with use of teabag. She is now back on aspirin and continues to take Plavix for dual antiplatelet therapy.  Per Dr. Orvan Seen last note, she is to continue Plavix indefinitely, though somewhat unclear if aspirin was intended to be continued as well.  We have continued her on DAPT with no significant issues. ? ?She has recently seen neurology who feel she is stable from a neurologic perspective post stroke.  No more routine follow-up with cardiac surgery. ? ?BP has been somewhat variable but no worrisome high blood pressures as of yet. However, there is new LBBB on ECG today. Normal rate.  ? ?The patient denies chest pain, chest pressure, dyspnea at rest or with exertion, palpitations, PND, orthopnea, or leg swelling. Denies cough, fever, chills. Denies nausea, vomiting. Denies syncope or presyncope. Denies dizziness or lightheadedness.  ? ?Past Medical History:  ?Diagnosis Date  ? Coronary artery disease   ? CVA (cerebral vascular accident) [facility])   ? Herpes zoster 2012  ? Vertigo   ? ? ?Past Surgical History:  ?Procedure Laterality Date  ? CARDIAC CATHETERIZATION    ?  CORONARY ARTERY BYPASS GRAFT N/A 11/29/2019  ? Procedure: CORONARY ARTERY BYPASS GRAFTING (CABG) x 6 ON PUMP. USING LEFT INTERNAL MAMMARY ARTERY, LEFT RADIAL ARTERY AND RIGHT GREATER SAPHENOUS VEIN.;  Surgeon: Wonda Olds, MD;  Location: [place];  Service: Open Heart Surgery;  Laterality: N/A;  ? ENDOVEIN HARVEST OF GREATER SAPHENOUS VEIN Right 11/29/2019  ? Procedure: ENDOVEIN HARVEST OF GREATER SAPHENOUS VEIN;  Surgeon: Wonda Olds, MD;  Location: [place];  Service: Open Heart Surgery;  Laterality: Right;  ? LEFT HEART CATH AND CORONARY ANGIOGRAPHY N/A 11/23/2019  ? Procedure: LEFT HEART CATH AND CORONARY ANGIOGRAPHY;  Surgeon: Jettie Booze, MD;  Location: [place] [facility];  Service: Cardiovascular;  Laterality: N/A;  ? RADIAL ARTERY HARVEST Left 11/29/2019  ? Procedure: RADIAL ARTERY HARVEST;  Surgeon: Wonda Olds, MD;  Location: [place];  Service: Open Heart Surgery;  Laterality: Left;  ? RHINOPLASTY    ? TEE WITHOUT CARDIOVERSION N/A 11/29/2019  ? Procedure: TRANSESOPHAGEAL ECHOCARDIOGRAM (TEE);  Surgeon: Wonda Olds, MD;  Location: [place];  Service: Open Heart Surgery;  Laterality: N/A;  ? ? ?Current Medications: ?Current Meds  ?Medication Sig  ? amitriptyline (ELAVIL) 10 MG tablet TAKE 1 TABLET BY MOUTH EVERYDAY AT BEDTIME  ? aspirin EC 81 MG tablet Take 81 mg by mouth daily. Swallow whole.  ? atorvastatin (LIPITOR) 80 MG tablet TAKE 1 TABLET BY MOUTH EVERY DAY  ? carvedilol (COREG) 6.25 MG tablet Take 1 tablet (6.25 mg total) by mouth 2 (two) times daily.  ?  clopidogrel (PLAVIX) 75 MG tablet Take 1 tablet (75 mg total) by mouth daily.  ? gabapentin (NEURONTIN) 300 MG capsule Take 900 mg by mouth at bedtime.  ? Multiple Vitamin (MULTIVITAMIN ADULT PO) Take 1 tablet by mouth daily.  ? valsartan (DIOVAN) 160 MG tablet Take 1 tablet (160 mg total) by mouth daily.  ?  ? ?Allergies:   Mango flavor and Contrast media [iodinated contrast media]  ? ?Social History  ? ?Tobacco Use  ? Smoking  status: Never  ? Smokeless tobacco: Never  ?Vaping Use  ? Vaping Use: Never used  ?Substance Use Topics  ? Alcohol use: No  ? Drug use: No  ?  ? ?Family History: ?The patient's family history includes CAD (age of onset: 49) in her father; Lung cancer (age of onset: 72) in her father; Unexplained death in her mother. ? ?ROS:   ?Please see the history of present illness.    ?All other systems reviewed and are negative. ? ?EKGs/Labs/Other Studies Reviewed:   ? ?The following studies were reviewed today: ? ?EKG: NSR, LBBB - new ?Last visit: Sinus rhythm, PVCs, septal infarct. ? ?Recent Labs: ?03/29/2021: BUN 14; Creatinine, Ser 0.90; Potassium 5.4; Sodium 143  ?Recent Lipid Panel ?   ?Component Value Date/Time  ? CHOL 62 11/30/2019 0459  ? TRIG 68 11/30/2019 0459  ? HDL 20 (L) 11/30/2019 0459  ? CHOLHDL 3.1 11/30/2019 0459  ? VLDL 14 11/30/2019 0459  ? Sulphur 28 11/30/2019 0459  ? ? ?Physical Exam:   ? ?VS:  BP (!) 132/58   Pulse 84   Resp (!) 94   Ht '5\' 4"'$  (1.626 m)   Wt 196 lb (88.9 kg)   BMI 33.64 kg/m?    ? ?Wt Readings from Last 5 Encounters:  ?05/15/21 196 lb (88.9 kg)  ?02/21/21 201 lb (91.2 kg)  ?11/14/20 201 lb 12.8 oz (91.5 kg)  ?10/09/20 197 lb (89.4 kg)  ?08/28/20 195 lb (88.5 kg)  ?  ?Constitutional: No acute distress ?Eyes: sclera non-icteric, normal conjunctiva and lids ?ENMT: normal dentition, moist mucous membranes ?Cardiovascular: regular rhythm, normal rate, no murmurs. S1 and S2 normal. Radial pulses normal bilaterally. No jugular venous distention.  ?Respiratory: clear to auscultation bilaterally ?GI : normal bowel sounds, soft and nontender. No distention.   ?MSK: extremities warm, well perfused. No edema.  ?NEURO: grossly nonfocal exam, moves all extremities. ?PSYCH: alert and oriented x 3, normal mood and affect.  ? ? ?ASSESSMENT:   ? ?1. New onset left bundle branch block (LBBB)   ?2. Coronary artery disease involving native coronary artery of native heart without angina pectoris   ?3. S/P  CABG x 6   ?4. Left ventricular apical aneurysm   ? ? ? ?PLAN:   ? ?New LBBB ?-unclear etiology. Will obtain echo, discussed in shared decision making and will obtain MPI with cardiac PET-CT as well. No chest pain or SOB. Concern due to prior revascularization.  ? ?Coronary artery disease involving native coronary artery of native heart without angina pectoris ?Left ventricular apical aneurysm ?S/P CABG x 6 ? ?- indefinite DAPT recommended per TCTS, however I will clarify whether the intent was for Plavix monotherapy versus both Plavix and aspirin.  At this time she is stable and can continue aspirin 81 mg daily and clopidogrel 75 mg daily. ?- continue carvedilol 6.25 mg twice daily and atorvastatin 80 mg daily. ? ?Primary hypertension ?Medication management ?-continue amlodipine 2.5 mg daily (recently added), valsartan 160 mg daily (recently uptitrated)  and carvedilol 6.25 mg BID.  ? ?Hyperlipidemia, unspecified hyperlipidemia type ?- continue lipitor 80 mg daily ? ?Cerebrovascular accident (CVA) due to thrombosis of left cerebellar artery ([place]) ?- recovered per neurology. Can continue plavix, does not need dapt for stroke.   ? ?Total time of encounter: ?30 minutes total time of encounter, including 25 minutes spent in face-to-face patient care on the date of this encounter. This time includes coordination of care and counseling regarding above mentioned problem list. Remainder of non-face-to-face time involved reviewing chart documents/testing relevant to the patient encounter and documentation in the medical record. I have independently reviewed documentation from referring provider.  ? ?Cherlynn Kaiser, MD, [facility] ?[place]  ? ? ? ? ?Medication Adjustments/Labs and Tests Ordered: ?Current medicines are reviewed at length with the patient today.  Concerns regarding medicines are outlined above.  ? ?Orders Placed This Encounter  ?Procedures  ? NM PET CT CARDIAC PERFUSION MULTI W/ABSOLUTE  BLOODFLOW  ? EKG 12-Lead  ? ECHOCARDIOGRAM COMPLETE  ? ? ? ? ?No orders of the defined types were placed in this encounter. ? ? ? ? ?Patient Instructions  ?Medication Instructions:  ?No Changes In Medications at this time.  ?

## 2021-05-15 NOTE — Patient Instructions (Signed)
Medication Instructions:  ?No Changes In Medications at this time.  ?*If you need a refill on your cardiac medications before your next appointment, please call your pharmacy* ? ?Testing/Procedures: ?Your physician has requested that you have an echocardiogram. Echocardiography is a painless test that uses sound waves to create images of your heart. It provides your doctor with information about the size and shape of your heart and how well your heart?s chambers and valves are working. You may receive an ultrasound enhancing agent through an IV if needed to better visualize your heart during the echo.This procedure takes approximately one hour. There are no restrictions for this procedure. This will take place at the [address].  ? ?CARDIAC PET- Your physician has requested that you have a Cardiac Pet Stress Test. This testing is completed at [facility] ([place], [place] [address]). The schedulers will call you to get this scheduled. Please follow instructions below and call the office with any questions/concerns [phone]). ? ?How to Prepare for Your Cardiac PET/CT Stress Test: ? ?1. Please do not take these medications before your test:  ? ?Medications that may interfere with the cardiac pharmacological stress agent (ex. nitrates or beta-blockers) the day of the exam. ?Theophylline containing medications for 12 hours. ?Dipyridamole 48 hours prior to the test. ?Your remaining medications may be taken with water. ? ?2. Nothing to eat or drink, except water, 3 hours prior to arrival time.   ?NO caffeine/decaffeinated products, or chocolate 12 hours prior to arrival. ? ?3. NO perfume, cologne or lotion ? ?4. Total time is 1 to 2 hours; you may want to bring reading material for the waiting time. ? ?5. Please report to Admitting at the [facility] 60 minutes early for your test. ? [facility] ? [place], [place] [address] ? ?Diabetic  Preparation:  ?Hold oral medications. ?You may take NPH and Lantus insulin. ?Do not take Humalog or Humulin R (Regular Insulin) the day of your test. ?Check blood sugars prior to leaving the house. ?If able to eat breakfast prior to 3 hour fasting, you may take all medications, including your insulin, ?Do not worry if you miss your breakfast dose of insulin - start at your next meal. ? ?IF YOU THINK YOU MAY BE PREGNANT, OR ARE NURSING PLEASE INFORM THE TECHNOLOGIST. ? ?In preparation for your appointment, medication and supplies will be purchased.  Appointment availability is limited, so if you need to cancel or reschedule, please call the Radiology Department at [phone]  24 hours in advance to avoid a cancellation fee of $100.00 ? ?What to Expect After you Arrive: ? ?Once you arrive and check in for your appointment, you will be taken to a preparation room within the Radiology Department.  A technologist or Nurse will obtain your medical history, verify that you are correctly prepped for the exam, and explain the procedure.  Afterwards,  an IV will be started in your arm and electrodes will be placed on your skin for EKG monitoring during the stress portion of the exam. Then you will be escorted to the PET/CT scanner.  There, staff will get you positioned on the scanner and obtain a blood pressure and EKG.  During the exam, you will continue to be connected to the EKG and blood pressure machines.  A small, safe amount of a radioactive tracer will be injected in your IV to obtain a series of pictures of your heart along  with an injection of a stress agent.   ? ?After your Exam: ? ?It is recommended that you eat a meal and drink a caffeinated beverage to counter act any effects of the stress agent.  Drink plenty of fluids for the remainder of the day and urinate frequently for the first couple of hours after the exam.  Your doctor will inform you of your test results within 7-10 business days. ? ?For questions  about your test or how to prepare for your test, please call: ?Marchia Bond, Cardiac Imaging Nurse Navigator  ?Gordy Clement, Cardiac Imaging Nurse Navigator ?Office: [phone] ? ?Follow-Up: ?At [facility], you and your health needs are our priority.  As part of our continuing mission to provide you with exceptional heart care, we have created designated Provider Care Teams.  These Care Teams include your primary Cardiologist (physician) and Advanced Practice Providers (APPs -  Physician Assistants and Nurse Practitioners) who all work together to provide you with the care you need, when you need it. ? ?Your next appointment:   ?June 7th at 2:40AM ? ?The format for your next appointment:   ?In Person ? ?Provider:   ?Elouise Munroe, MD   ? ? ? ? ?  ?

## 2021-05-25 ENCOUNTER — Ambulatory Visit (HOSPITAL_COMMUNITY): Payer: Medicare Other | Attending: Internal Medicine

## 2021-05-25 DIAGNOSIS — I447 Left bundle-branch block, unspecified: Secondary | ICD-10-CM

## 2021-05-25 LAB — ECHOCARDIOGRAM COMPLETE
Area-P 1/2: 4.89 cm2
S' Lateral: 3.1 cm

## 2021-05-25 MED ORDER — PERFLUTREN LIPID MICROSPHERE
1.0000 mL | INTRAVENOUS | Status: AC | PRN
  Administered 2021-05-25: 1 mL via INTRAVENOUS

## 2021-05-31 ENCOUNTER — Other Ambulatory Visit: Payer: Self-pay | Admitting: Internal Medicine

## 2021-06-04 ENCOUNTER — Other Ambulatory Visit: Payer: Self-pay | Admitting: Internal Medicine

## 2021-06-04 MED ORDER — VALSARTAN 160 MG PO TABS: 160.0000 mg | ORAL_TABLET | Freq: Every day | ORAL | 3 refills | Status: DC

## 2021-06-08 ENCOUNTER — Telehealth (HOSPITAL_COMMUNITY): Payer: Self-pay | Admitting: Emergency Medicine

## 2021-06-08 NOTE — Telephone Encounter (Signed)
Reaching out to patient to offer assistance regarding upcoming cardiac imaging study; pt verbalizes understanding of appt date/time, parking situation and where to check in, pre-test NPO status and medications ordered, and verified current allergies; name and call back number provided for further questions should they arise Edita Weyenberg RN Navigator Cardiac Imaging  Heart and Vascular [phone] office [phone] cell 

## 2021-06-12 ENCOUNTER — Encounter (HOSPITAL_COMMUNITY)
Admission: RE | Admit: 2021-06-12 | Discharge: 2021-06-12 | Disposition: A | Discharge status: Home / Self Care | Payer: Medicare Other | Source: Ambulatory Visit | Attending: Internal Medicine | Admitting: Internal Medicine

## 2021-06-12 DIAGNOSIS — I447 Left bundle-branch block, unspecified: Secondary | ICD-10-CM | POA: Diagnosis not present

## 2021-06-12 LAB — NM PET CT CARDIAC PERFUSION MULTI W/ABSOLUTE BLOODFLOW
Nuc Rest EF: 66 %
Peak HR: 115 {beats}/min
Rest HR: 93 {beats}/min
Rest MBF: 1.2 ml/g/min
Rest Nuclear Isotope Dose: 22.9 mCi
ST Depression (mm): 0 mm
Stress Nuclear Isotope Dose: 20.7 mCi

## 2021-06-12 MED ORDER — REGADENOSON 0.4 MG/5ML IV SOLN
INTRAVENOUS | Status: AC
  Administered 2021-06-12: 0.4 mg
  Filled 2021-06-12: qty 5

## 2021-06-12 MED ORDER — RUBIDIUM RB82 GENERATOR (RUBYFILL)
22.9600 | PACK | Freq: Once | INTRAVENOUS | Status: AC
  Administered 2021-06-12: 22.96 via INTRAVENOUS

## 2021-06-12 MED ORDER — RUBIDIUM RB82 GENERATOR (RUBYFILL)
23.4400 | PACK | Freq: Once | INTRAVENOUS | Status: AC
  Administered 2021-06-12: 23.44 via INTRAVENOUS

## 2021-06-27 ENCOUNTER — Encounter: Payer: Self-pay | Admitting: Internal Medicine

## 2021-06-27 ENCOUNTER — Ambulatory Visit: Payer: Medicare Other | Admitting: Internal Medicine

## 2021-06-27 VITALS — BP 130/72 | HR 77 | Ht 64.0 in | Wt 191.0 lb

## 2021-06-27 DIAGNOSIS — I2510 Atherosclerotic heart disease of native coronary artery without angina pectoris: Secondary | ICD-10-CM | POA: Diagnosis not present

## 2021-06-27 NOTE — Progress Notes (Unsigned)
Cardiology Office Note:    Date:  06/27/2021   ID:  Jamie Hodge, DOB [birth year], MRN [id number]  PCP:  [facility] [place]  Cardiologist:  Elouise Munroe, MD  Electrophysiologist:  None   Referring MD: [facility]*   Chief Complaint/Reason for Referral: CAD s/p CABG  History of Present Illness:    Jamie Hodge is a 81 y.o. 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 CCTA showed severe CAD, and after cath she was referred for CABG for multivessel CAD. She underwent CABG on 11/29/19. Did not tolerate carvedilol 12.5 mg BID, dose reduced to 6.25 mg BID. She is otherwise doing well. Stopped aspirin due to nosebleeds per neurology notes. Nosebleeding subsided with use of teabag. She is now back on aspirin and continues to take Plavix for dual antiplatelet therapy.  Per Dr. Orvan Seen last note, she is to continue Plavix indefinitely, though somewhat unclear if aspirin was intended to be continued as well.  We have continued her on DAPT with no significant issues.  She has recently seen neurology who feel she is stable from a neurologic perspective post stroke.  No more routine follow-up with cardiac surgery.  BP has been somewhat variable but no worrisome high blood pressures as of yet. However, there is new LBBB on ECG today. Normal rate.   The patient denies chest pain, chest pressure, dyspnea at rest or with exertion, palpitations, PND, orthopnea, or leg swelling. Denies cough, fever, chills. Denies nausea, vomiting. Denies syncope or presyncope. Denies dizziness or lightheadedness.   Past Medical History:  Diagnosis Date   Coronary artery disease    CVA (cerebral vascular accident) ([place])    Herpes zoster 2012   Vertigo     Past Surgical History:  Procedure Laterality Date   CARDIAC CATHETERIZATION      CORONARY ARTERY BYPASS GRAFT N/A 11/29/2019   Procedure: CORONARY ARTERY BYPASS GRAFTING (CABG) x 6 ON PUMP. USING LEFT INTERNAL MAMMARY ARTERY, LEFT RADIAL ARTERY AND RIGHT GREATER SAPHENOUS VEIN.;  Surgeon: Wonda Olds, MD;  Location: [place];  Service: Open Heart Surgery;  Laterality: N/A;   ENDOVEIN HARVEST OF GREATER SAPHENOUS VEIN Right 11/29/2019   Procedure: ENDOVEIN HARVEST OF GREATER SAPHENOUS VEIN;  Surgeon: Wonda Olds, MD;  Location: [facility];  Service: Open Heart Surgery;  Laterality: Right;   LEFT HEART CATH AND CORONARY ANGIOGRAPHY N/A 11/23/2019   Procedure: LEFT HEART CATH AND CORONARY ANGIOGRAPHY;  Surgeon: Jettie Booze, MD;  Location: [facility];  Service: Cardiovascular;  Laterality: N/A;   RADIAL ARTERY HARVEST Left 11/29/2019   Procedure: RADIAL ARTERY HARVEST;  Surgeon: Wonda Olds, MD;  Location: [facility];  Service: Open Heart Surgery;  Laterality: Left;   RHINOPLASTY     TEE WITHOUT CARDIOVERSION N/A 11/29/2019   Procedure: TRANSESOPHAGEAL ECHOCARDIOGRAM (TEE);  Surgeon: Wonda Olds, MD;  Location: [facility];  Service: Open Heart Surgery;  Laterality: N/A;    Current Medications: No outpatient medications have been marked as taking for the 06/27/21 encounter (Office Visit) with Elouise Munroe, MD.     Allergies:   Mango flavor and Contrast media [iodinated contrast media]   Social History   Tobacco Use   Smoking status: Never   Smokeless tobacco: Never  Vaping Use   Vaping Use: Never used  Substance Use Topics   Alcohol use: No  Drug use: No     Family History: The patient's family history includes CAD (age of onset: 67) in her father; Lung cancer (age of onset: 29) in her father; Unexplained death in her mother.  ROS:   Please see the history of present illness.    All other systems reviewed and are negative.  EKGs/Labs/Other Studies Reviewed:    The following studies were reviewed today:  EKG: NSR, LBBB  Recent  Labs: 03/29/2021: BUN 14; Creatinine, Ser 0.90; Potassium 5.4; Sodium 143  Recent Lipid Panel    Component Value Date/Time   CHOL 62 11/30/2019 0459   TRIG 68 11/30/2019 0459   HDL 20 (L) 11/30/2019 0459   CHOLHDL 3.1 11/30/2019 0459   VLDL 14 11/30/2019 0459   LDLCALC 28 11/30/2019 0459    Physical Exam:    VS:  BP 130/72   Pulse 77   Ht '5\' 4"'$  (1.626 m)   Wt 191 lb (86.6 kg)   SpO2 95%   BMI 32.79 kg/m     Wt Readings from Last 5 Encounters:  06/27/21 191 lb (86.6 kg)  05/15/21 196 lb (88.9 kg)  02/21/21 201 lb (91.2 kg)  11/14/20 201 lb 12.8 oz (91.5 kg)  10/09/20 197 lb (89.4 kg)    Constitutional: No acute distress Eyes: sclera non-icteric, normal conjunctiva and lids ENMT: normal dentition, moist mucous membranes Cardiovascular: regular rhythm, normal rate, no murmurs. S1 and S2 normal. Radial pulses normal bilaterally. No jugular venous distention.  Respiratory: clear to auscultation bilaterally GI : normal bowel sounds, soft and nontender. No distention.   MSK: extremities warm, well perfused. No edema.  NEURO: grossly nonfocal exam, moves all extremities. PSYCH: alert and oriented x 3, normal mood and affect.    ASSESSMENT:    1. Coronary artery disease involving native coronary artery of native heart without angina pectoris      PLAN:    New LBBB -unclear etiology. Will obtain echo, discussed in shared decision making and will obtain MPI with cardiac PET-CT as well. No chest pain or SOB. Concern due to prior revascularization.   Coronary artery disease involving native coronary artery of native heart without angina pectoris Left ventricular apical aneurysm S/P CABG x 6  - indefinite DAPT recommended per TCTS, however I will clarify whether the intent was for Plavix monotherapy versus both Plavix and aspirin.  At this time she is stable and can continue aspirin 81 mg daily and clopidogrel 75 mg daily. - continue carvedilol 6.25 mg twice daily and  atorvastatin 80 mg daily.  Primary hypertension Medication management -continue amlodipine 2.5 mg daily (recently added), valsartan 160 mg daily (recently uptitrated) and carvedilol 6.25 mg BID.   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 Can continue plavix, does not need dapt for stroke.    Total time of encounter: 30 minutes total time of encounter, including 25 minutes spent in face-to-face patient care on the date of this encounter. This time includes coordination of care and counseling regarding above mentioned problem list. Remainder of non-face-to-face time involved reviewing chart documents/testing relevant to the patient encounter and documentation in the medical record. I have independently reviewed documentation from referring provider.   Cherlynn Kaiser, MD, [facility]      Medication Adjustments/Labs and Tests Ordered: Current medicines are reviewed at length with the patient today.  Concerns regarding medicines are  outlined above.   Orders Placed This Encounter  Procedures   EKG 12-Lead      No orders of the defined types were placed in this encounter.     Patient Instructions  Medication Instructions:  No Changes In Medications at this time.  *If you need a refill on your cardiac medications before your next appointment, please call your pharmacy*  Follow-Up: At [facility], you and your health needs are our priority.  As part of our continuing mission to provide you with exceptional heart care, we have created designated Provider Care Teams.  These Care Teams include your primary Cardiologist (physician) and Advanced Practice Providers (APPs -  Physician Assistants and Nurse Practitioners) who all work together to provide you with the care you need, when you need it.  Your next  appointment:   6 month(s)  The format for your next appointment:   In Person  Provider:   Elouise Munroe, MD

## 2021-06-27 NOTE — Patient Instructions (Signed)
Medication Instructions:  ?No Changes In Medications at this time.  ?*If you need a refill on your cardiac medications before your next appointment, please call your pharmacy* ? ?Follow-Up: ?At [facility], you and your health needs are our priority.  As part of our continuing mission to provide you with exceptional heart care, we have created designated Provider Care Teams.  These Care Teams include your primary Cardiologist (physician) and Advanced Practice Providers (APPs -  Physician Assistants and Nurse Practitioners) who all work together to provide you with the care you need, when you need it. ? ?Your next appointment:   ?6 month(s) ? ?The format for your next appointment:   ?In Person ? ?Provider:   ?Gayatri A Acharya, MD   ? ?  ?

## 2021-07-30 DIAGNOSIS — H353132 Nonexudative age-related macular degeneration, bilateral, intermediate dry stage: Secondary | ICD-10-CM | POA: Diagnosis not present

## 2021-10-20 ENCOUNTER — Encounter (HOSPITAL_COMMUNITY): Payer: Self-pay

## 2021-10-20 ENCOUNTER — Inpatient Hospital Stay (HOSPITAL_COMMUNITY)
Admission: EM | Admit: 2021-10-20 | Discharge: 2021-10-23 | DRG: 378 | Disposition: A | Discharge status: Home Health | Payer: Medicare Other | Attending: Internal Medicine | Admitting: Internal Medicine

## 2021-10-20 ENCOUNTER — Other Ambulatory Visit: Payer: Self-pay

## 2021-10-20 ENCOUNTER — Emergency Department (HOSPITAL_COMMUNITY): Payer: Medicare Other

## 2021-10-20 DIAGNOSIS — D132 Benign neoplasm of duodenum: Secondary | ICD-10-CM | POA: Diagnosis not present

## 2021-10-20 DIAGNOSIS — K922 Gastrointestinal hemorrhage, unspecified: Secondary | ICD-10-CM | POA: Diagnosis present

## 2021-10-20 DIAGNOSIS — E785 Hyperlipidemia, unspecified: Secondary | ICD-10-CM | POA: Diagnosis present

## 2021-10-20 DIAGNOSIS — Z8673 Personal history of transient ischemic attack (TIA), and cerebral infarction without residual deficits: Secondary | ICD-10-CM

## 2021-10-20 DIAGNOSIS — Z7982 Long term (current) use of aspirin: Secondary | ICD-10-CM

## 2021-10-20 DIAGNOSIS — W182XXA Fall in (into) shower or empty bathtub, initial encounter: Secondary | ICD-10-CM | POA: Diagnosis present

## 2021-10-20 DIAGNOSIS — Z9102 Food additives allergy status: Secondary | ICD-10-CM

## 2021-10-20 DIAGNOSIS — Z8249 Family history of ischemic heart disease and other diseases of the circulatory system: Secondary | ICD-10-CM | POA: Diagnosis not present

## 2021-10-20 DIAGNOSIS — I10 Essential (primary) hypertension: Secondary | ICD-10-CM | POA: Diagnosis not present

## 2021-10-20 DIAGNOSIS — R6889 Other general symptoms and signs: Secondary | ICD-10-CM | POA: Diagnosis not present

## 2021-10-20 DIAGNOSIS — K259 Gastric ulcer, unspecified as acute or chronic, without hemorrhage or perforation: Secondary | ICD-10-CM | POA: Diagnosis not present

## 2021-10-20 DIAGNOSIS — K449 Diaphragmatic hernia without obstruction or gangrene: Secondary | ICD-10-CM | POA: Diagnosis not present

## 2021-10-20 DIAGNOSIS — Z7401 Bed confinement status: Secondary | ICD-10-CM | POA: Diagnosis not present

## 2021-10-20 DIAGNOSIS — Y92009 Unspecified place in unspecified non-institutional (private) residence as the place of occurrence of the external cause: Secondary | ICD-10-CM

## 2021-10-20 DIAGNOSIS — Z91041 Radiographic dye allergy status: Secondary | ICD-10-CM | POA: Diagnosis not present

## 2021-10-20 DIAGNOSIS — Z743 Need for continuous supervision: Secondary | ICD-10-CM | POA: Diagnosis not present

## 2021-10-20 DIAGNOSIS — D649 Anemia, unspecified: Secondary | ICD-10-CM | POA: Diagnosis not present

## 2021-10-20 DIAGNOSIS — D72829 Elevated white blood cell count, unspecified: Secondary | ICD-10-CM | POA: Diagnosis not present

## 2021-10-20 DIAGNOSIS — S82891A Other fracture of right lower leg, initial encounter for closed fracture: Secondary | ICD-10-CM | POA: Diagnosis not present

## 2021-10-20 DIAGNOSIS — K920 Hematemesis: Secondary | ICD-10-CM | POA: Diagnosis present

## 2021-10-20 DIAGNOSIS — K254 Chronic or unspecified gastric ulcer with hemorrhage: Principal | ICD-10-CM | POA: Diagnosis present

## 2021-10-20 DIAGNOSIS — Z951 Presence of aortocoronary bypass graft: Secondary | ICD-10-CM

## 2021-10-20 DIAGNOSIS — S8251XA Displaced fracture of medial malleolus of right tibia, initial encounter for closed fracture: Secondary | ICD-10-CM | POA: Diagnosis not present

## 2021-10-20 DIAGNOSIS — B0229 Other postherpetic nervous system involvement: Secondary | ICD-10-CM | POA: Diagnosis not present

## 2021-10-20 DIAGNOSIS — R338 Other retention of urine: Secondary | ICD-10-CM | POA: Diagnosis not present

## 2021-10-20 DIAGNOSIS — Z7902 Long term (current) use of antithrombotics/antiplatelets: Secondary | ICD-10-CM

## 2021-10-20 DIAGNOSIS — S8254XA Nondisplaced fracture of medial malleolus of right tibia, initial encounter for closed fracture: Secondary | ICD-10-CM | POA: Diagnosis not present

## 2021-10-20 DIAGNOSIS — K3189 Other diseases of stomach and duodenum: Secondary | ICD-10-CM | POA: Diagnosis not present

## 2021-10-20 DIAGNOSIS — D509 Iron deficiency anemia, unspecified: Secondary | ICD-10-CM | POA: Insufficient documentation

## 2021-10-20 DIAGNOSIS — K222 Esophageal obstruction: Secondary | ICD-10-CM | POA: Diagnosis present

## 2021-10-20 DIAGNOSIS — I2510 Atherosclerotic heart disease of native coronary artery without angina pectoris: Secondary | ICD-10-CM | POA: Diagnosis not present

## 2021-10-20 DIAGNOSIS — I25118 Atherosclerotic heart disease of native coronary artery with other forms of angina pectoris: Secondary | ICD-10-CM

## 2021-10-20 DIAGNOSIS — Z79899 Other long term (current) drug therapy: Secondary | ICD-10-CM

## 2021-10-20 DIAGNOSIS — M7989 Other specified soft tissue disorders: Secondary | ICD-10-CM | POA: Diagnosis not present

## 2021-10-20 DIAGNOSIS — I499 Cardiac arrhythmia, unspecified: Secondary | ICD-10-CM | POA: Diagnosis not present

## 2021-10-20 DIAGNOSIS — R404 Transient alteration of awareness: Secondary | ICD-10-CM | POA: Diagnosis not present

## 2021-10-20 DIAGNOSIS — Y93E1 Activity, personal bathing and showering: Secondary | ICD-10-CM

## 2021-10-20 DIAGNOSIS — K319 Disease of stomach and duodenum, unspecified: Secondary | ICD-10-CM | POA: Diagnosis not present

## 2021-10-20 LAB — POC OCCULT BLOOD, ED: Fecal Occult Bld: POSITIVE — AB

## 2021-10-20 LAB — COMPREHENSIVE METABOLIC PANEL
ALT: 19 U/L (ref 0–44)
AST: 24 U/L (ref 15–41)
Albumin: 3.9 g/dL (ref 3.5–5.0)
Alkaline Phosphatase: 75 U/L (ref 38–126)
Anion gap: 9 (ref 5–15)
BUN: 18 mg/dL (ref 8–23)
CO2: 24 mmol/L (ref 22–32)
Calcium: 9.2 mg/dL (ref 8.9–10.3)
Chloride: 103 mmol/L (ref 98–111)
Creatinine, Ser: 0.72 mg/dL (ref 0.44–1.00)
GFR, Estimated: >60 mL/min (ref >60)
Glucose, Bld: 127 mg/dL — ABNORMAL HIGH (ref 70–99)
Potassium: 3.7 mmol/L (ref 3.5–5.1)
Sodium: 136 mmol/L (ref 135–145)
Total Bilirubin: 0.6 mg/dL (ref 0.3–1.2)
Total Protein: 6.8 g/dL (ref 6.5–8.1)

## 2021-10-20 LAB — HEMOGLOBIN AND HEMATOCRIT, BLOOD
HCT: 25.7 % — ABNORMAL LOW (ref 36.0–46.0)
Hemoglobin: 8.2 g/dL — ABNORMAL LOW (ref 12.0–15.0)

## 2021-10-20 LAB — CBC WITH DIFFERENTIAL/PLATELET
Abs Immature Granulocytes: 0.12 K/uL — ABNORMAL HIGH (ref 0.00–0.07)
Basophils Absolute: 0.1 K/uL (ref 0.0–0.1)
Basophils Relative: 0 %
Eosinophils Absolute: 0 K/uL (ref 0.0–0.5)
Eosinophils Relative: 0 %
HCT: 26.9 % — ABNORMAL LOW (ref 36.0–46.0)
Hemoglobin: 8.6 g/dL — ABNORMAL LOW (ref 12.0–15.0)
Immature Granulocytes: 1 %
Lymphocytes Relative: 10 %
Lymphs Abs: 1.5 K/uL (ref 0.7–4.0)
MCH: 29.9 pg (ref 26.0–34.0)
MCHC: 32 g/dL (ref 30.0–36.0)
MCV: 93.4 fL (ref 80.0–100.0)
Monocytes Absolute: 0.7 K/uL (ref 0.1–1.0)
Monocytes Relative: 5 %
Neutro Abs: 12.3 K/uL — ABNORMAL HIGH (ref 1.7–7.7)
Neutrophils Relative %: 84 %
Platelets: 276 K/uL (ref 150–400)
RBC: 2.88 MIL/uL — ABNORMAL LOW (ref 3.87–5.11)
RDW: 14.3 % (ref 11.5–15.5)
WBC: 14.7 K/uL — ABNORMAL HIGH (ref 4.0–10.5)
nRBC: 0 % (ref 0.0–0.2)

## 2021-10-20 LAB — LACTIC ACID, PLASMA: Lactic Acid, Venous: 1.4 mmol/L (ref 0.5–1.9)

## 2021-10-20 LAB — PREPARE RBC (CROSSMATCH)

## 2021-10-20 LAB — SURGICAL PCR SCREEN
MRSA, PCR: NEGATIVE
Staphylococcus aureus: POSITIVE — AB

## 2021-10-20 MED ORDER — ACETAMINOPHEN 650 MG RE SUPP: 650.0000 mg | Freq: Four times a day (QID) | RECTAL | Status: DC | PRN

## 2021-10-20 MED ORDER — SODIUM CHLORIDE 0.9 % IV SOLN
10.0000 mL/h | Freq: Once | INTRAVENOUS | Status: AC
  Administered 2021-10-20: 10 mL/h via INTRAVENOUS

## 2021-10-20 MED ORDER — HYDROMORPHONE HCL 1 MG/ML IJ SOLN
0.5000 mg | INTRAMUSCULAR | Status: DC | PRN
  Administered 2021-10-20 – 2021-10-21 (×4): 0.5 mg via INTRAVENOUS
  Filled 2021-10-20 (×4): qty 0.5

## 2021-10-20 MED ORDER — ONDANSETRON HCL 4 MG PO TABS
4.0000 mg | ORAL_TABLET | Freq: Four times a day (QID) | ORAL | Status: DC | PRN
  Administered 2021-10-20: 4 mg via ORAL
  Filled 2021-10-20: qty 1

## 2021-10-20 MED ORDER — OXYCODONE HCL 5 MG PO TABS
5.0000 mg | ORAL_TABLET | ORAL | Status: DC | PRN
  Administered 2021-10-20 – 2021-10-21 (×4): 5 mg via ORAL
  Filled 2021-10-20 (×4): qty 1

## 2021-10-20 MED ORDER — AMITRIPTYLINE HCL 10 MG PO TABS
10.0000 mg | ORAL_TABLET | Freq: Every day | ORAL | Status: DC
  Administered 2021-10-20 – 2021-10-22 (×3): 10 mg via ORAL
  Filled 2021-10-20 (×4): qty 1

## 2021-10-20 MED ORDER — ACETAMINOPHEN 325 MG PO TABS
650.0000 mg | ORAL_TABLET | Freq: Four times a day (QID) | ORAL | Status: DC | PRN
  Administered 2021-10-20 – 2021-10-23 (×5): 650 mg via ORAL
  Filled 2021-10-20 (×5): qty 2

## 2021-10-20 MED ORDER — LACTATED RINGERS IV SOLN: INTRAVENOUS | Status: AC

## 2021-10-20 MED ORDER — SERTRALINE HCL 50 MG PO TABS
50.0000 mg | ORAL_TABLET | Freq: Every day | ORAL | Status: DC
  Administered 2021-10-20 – 2021-10-23 (×4): 50 mg via ORAL
  Filled 2021-10-20 (×4): qty 1

## 2021-10-20 MED ORDER — PANTOPRAZOLE SODIUM 40 MG IV SOLR: 40.0000 mg | Freq: Two times a day (BID) | INTRAVENOUS | Status: DC

## 2021-10-20 MED ORDER — MUPIROCIN 2 % EX OINT
1.0000 | TOPICAL_OINTMENT | Freq: Two times a day (BID) | CUTANEOUS | Status: DC
  Administered 2021-10-21 – 2021-10-23 (×6): 1 via NASAL
  Filled 2021-10-20: qty 22

## 2021-10-20 MED ORDER — PANTOPRAZOLE 80MG IVPB - SIMPLE MED
80.0000 mg | Freq: Once | INTRAVENOUS | Status: AC
  Administered 2021-10-20: 80 mg via INTRAVENOUS
  Filled 2021-10-20: qty 80

## 2021-10-20 MED ORDER — GABAPENTIN 300 MG PO CAPS
900.0000 mg | ORAL_CAPSULE | Freq: Every day | ORAL | Status: DC
  Administered 2021-10-20 – 2021-10-22 (×3): 900 mg via ORAL
  Filled 2021-10-20 (×3): qty 3

## 2021-10-20 MED ORDER — HYDROMORPHONE HCL 1 MG/ML IJ SOLN
0.5000 mg | Freq: Once | INTRAMUSCULAR | Status: AC
  Administered 2021-10-20: 0.5 mg via INTRAVENOUS
  Filled 2021-10-20: qty 1

## 2021-10-20 MED ORDER — CHLORHEXIDINE GLUCONATE CLOTH 2 % EX PADS
6.0000 | MEDICATED_PAD | Freq: Every day | CUTANEOUS | Status: DC
  Administered 2021-10-21 – 2021-10-23 (×3): 6 via TOPICAL

## 2021-10-20 MED ORDER — ONDANSETRON HCL 4 MG/2ML IJ SOLN: 4.0000 mg | Freq: Four times a day (QID) | INTRAMUSCULAR | Status: DC | PRN

## 2021-10-20 MED ORDER — PANTOPRAZOLE INFUSION (NEW) - SIMPLE MED
8.0000 mg/h | INTRAVENOUS | Status: DC
  Administered 2021-10-20 – 2021-10-21 (×4): 8 mg/h via INTRAVENOUS
  Filled 2021-10-20 (×3): qty 80
  Filled 2021-10-20: qty 100
  Filled 2021-10-20: qty 80

## 2021-10-20 MED ORDER — ATORVASTATIN CALCIUM 40 MG PO TABS
80.0000 mg | ORAL_TABLET | Freq: Every day | ORAL | Status: DC
  Administered 2021-10-20 – 2021-10-23 (×4): 80 mg via ORAL
  Filled 2021-10-20 (×4): qty 2

## 2021-10-20 NOTE — Consult Note (Signed)
[facility] Gastroenterology Consult  Referring Provider: No ref. provider found Primary Care Physician:  [place], [place] Primary Gastroenterologist: none  Reason for Consultation: coffee ground emesis  SUBJECTIVE:   HPI: Jamie Hodge is a 81 y.o. female with past medical history significant for coronary artery disease s/p CABG in 2021, CVA in 2021. She noted having episode of lightheadedness and potential loss of consciousness on 10/16/21, she had dark emesis surrounding her when she awoke. She did not immediately seek medical attention. She began to feel better during the next few days. She went to take a shower on 10/20/21, became lightheaded and sat down/fell in the shower. She subsequently presented to hospital for evaluation.   On presentation Hgb 8.6 (baseline appears to be 10.7 on labs 12/04/2019), WBC 14.7, Na 136, K 3.7, AST/ALT 24/19, ALP 75, fecal occult blood testing positive. No abdominal imaging was completed. Ankle xray was completed which showed a minimally displaced fracture through medial malleolus.   She takes aspirin and clopidogrel prior to admission. Her last dose of clopidogrel was 10/19/21. No prior EGD. Last colonoscopy completed roughly 10 years prior in [place], she does not recall any significant findings. She has no family history of colon cancer nor inflammatory bowel disease. She had a similar constellation of symptoms a few months prior though did not seek medical attention. She has not had further coffee ground emesis since 10/16/21. She has had episodes of epistaxis in past, though nothing recently. Cardiology note reviewed, appreciate recommendations for clopidogrel monotherapy once endoscopic evaluation complete.  Past Medical History:  Diagnosis Date   Coronary artery disease    CVA (cerebral vascular accident) ([place])    Herpes zoster 2012   HERPES ZOSTER 08/13/2010   Qualifier: Diagnosis of  By: Koleen Nimrod MD, Jeffrey     Stroke [facility])  09/25/2019   TIA (transient ischemic attack) 09/23/2019   Vertigo    Past Surgical History:  Procedure Laterality Date   CARDIAC CATHETERIZATION     CORONARY ARTERY BYPASS GRAFT N/A 11/29/2019   Procedure: CORONARY ARTERY BYPASS GRAFTING (CABG) x 6 ON PUMP. USING LEFT INTERNAL MAMMARY ARTERY, LEFT RADIAL ARTERY AND RIGHT GREATER SAPHENOUS VEIN.;  Surgeon: Wonda Olds, MD;  Location: [place];  Service: Open Heart Surgery;  Laterality: N/A;   ENDOVEIN HARVEST OF GREATER SAPHENOUS VEIN Right 11/29/2019   Procedure: ENDOVEIN HARVEST OF GREATER SAPHENOUS VEIN;  Surgeon: Wonda Olds, MD;  Location: [place];  Service: Open Heart Surgery;  Laterality: Right;   LEFT HEART CATH AND CORONARY ANGIOGRAPHY N/A 11/23/2019   Procedure: LEFT HEART CATH AND CORONARY ANGIOGRAPHY;  Surgeon: Jettie Booze, MD;  Location: [place] CV LAB;  Service: Cardiovascular;  Laterality: N/A;   RADIAL ARTERY HARVEST Left 11/29/2019   Procedure: RADIAL ARTERY HARVEST;  Surgeon: Wonda Olds, MD;  Location: [place];  Service: Open Heart Surgery;  Laterality: Left;   RHINOPLASTY     TEE WITHOUT CARDIOVERSION N/A 11/29/2019   Procedure: TRANSESOPHAGEAL ECHOCARDIOGRAM (TEE);  Surgeon: Wonda Olds, MD;  Location: [place];  Service: Open Heart Surgery;  Laterality: N/A;   Prior to Admission medications   Medication Sig Start Date End Date Taking? Authorizing Provider  amitriptyline (ELAVIL) 10 MG tablet TAKE 1 TABLET BY MOUTH EVERYDAY AT BEDTIME Patient taking differently: Take 10 mg by mouth at bedtime. 09/20/20  Yes McCue, Janett Billow, NP  amLODipine (NORVASC) 2.5 MG tablet Take 2.5 mg by mouth daily.   Yes [provider]  aspirin EC 81 MG tablet Take 81 mg by mouth at bedtime. Swallow whole.   Yes [provider]  atorvastatin (LIPITOR) 80 MG tablet TAKE 1 TABLET BY MOUTH EVERY DAY Patient taking differently: Take 80 mg by mouth daily. 10/02/20  Yes McCue, Janett Billow, NP  carvedilol (COREG) 6.25 MG  tablet Take 1 tablet (6.25 mg total) by mouth 2 (two) times daily. 12/11/20  Yes Elouise Munroe, MD  clopidogrel (PLAVIX) 75 MG tablet Take 1 tablet (75 mg total) by mouth daily. 02/26/21  Yes Elouise Munroe, MD  gabapentin (NEURONTIN) 300 MG capsule Take 900 mg by mouth at bedtime.   Yes [provider]  Multiple Vitamin (MULTIVITAMIN ADULT PO) Take 1 tablet by mouth daily.   Yes [provider]  Multiple Vitamins-Minerals (PRESERVISION AREDS 2+MULTI VIT PO) Take 2 tablets by mouth daily.   Yes [provider]  sertraline (ZOLOFT) 50 MG tablet Take 100 mg by mouth daily.   Yes [provider]  triamcinolone (NASACORT ALLERGY 24HR) 55 MCG/ACT AERO nasal inhaler Place 2 sprays into the nose daily as needed (sinus congestion).   Yes [provider]  valsartan (DIOVAN) 160 MG tablet Take 1 tablet (160 mg total) by mouth daily. 06/04/21  Yes Elouise Munroe, MD  isosorbide mononitrate (IMDUR) 30 MG 24 hr tablet Take 1 tablet (30 mg total) by mouth daily. 11/25/19 12/06/19  Jettie Booze, MD   Current Facility-Administered Medications  Medication Dose Route Frequency Provider Last Rate Last Admin   acetaminophen (TYLENOL) tablet 650 mg  650 mg Oral Q6H PRN Kristopher Oppenheim, DO       Or   acetaminophen (TYLENOL) suppository 650 mg  650 mg Rectal Q6H PRN Kristopher Oppenheim, DO       amitriptyline (ELAVIL) tablet 10 mg  10 mg Oral QHS Kristopher Oppenheim, DO       atorvastatin (LIPITOR) tablet 80 mg  80 mg Oral Daily Kristopher Oppenheim, DO   80 mg at 10/20/21 1015   gabapentin (NEURONTIN) capsule 900 mg  900 mg Oral QHS Kristopher Oppenheim, DO       HYDROmorphone (DILAUDID) injection 0.5 mg  0.5 mg Intravenous Q2H PRN Kristopher Oppenheim, DO   0.5 mg at 10/20/21 4174   Or   oxyCODONE (Oxy IR/ROXICODONE) immediate release tablet 5 mg  5 mg Oral Q4H PRN Kristopher Oppenheim, DO   5 mg at 10/20/21 0814   lactated ringers infusion   Intravenous Continuous Kristopher Oppenheim, DO 100 mL/hr at 10/20/21 1306 Infusion  Verify at 10/20/21 1306   ondansetron (ZOFRAN) tablet 4 mg  4 mg Oral Q6H PRN Kristopher Oppenheim, DO   4 mg at 10/20/21 0801   Or   ondansetron (ZOFRAN) injection 4 mg  4 mg Intravenous Q6H PRN Kristopher Oppenheim, DO       pantoprozole (PROTONIX) 80 mg /NS 100 mL infusion  8 mg/hr Intravenous Continuous Kristopher Oppenheim, DO 10 mL/hr at 10/20/21 1306 8 mg/hr at 10/20/21 1306   sertraline (ZOLOFT) tablet 50 mg  50 mg Oral Daily Kristopher Oppenheim, DO   50 mg at 10/20/21 1015   Allergies as of 10/20/2021 - Review Complete 10/20/2021  Allergen Reaction Noted   Mango flavor Anaphylaxis 09/24/2019   Contrast media [iodinated contrast media] Other (See Comments) 09/24/2019   Family History  Problem Relation Age of Onset   Unexplained death Mother        Died at the age of 1, no autopsy performed   CAD Father  6   Lung cancer Father 64   Social History   Socioeconomic History   Marital status: Married    Spouse name: Not on file   Number of children: Not on file   Years of education: Not on file   Highest education level: Not on file  Occupational History   Not on file  Tobacco Use   Smoking status: Never   Smokeless tobacco: Never  Vaping Use   Vaping Use: Never used  Substance and Sexual Activity   Alcohol use: No   Drug use: No   Sexual activity: Never    Birth control/protection: None  Other Topics Concern   Not on file  Social History Narrative   Not on file   Social Determinants of Health   Financial Resource Strain: Not on file  Food Insecurity: No Food Insecurity (10/20/2021)   Hunger Vital Sign    Worried About Running Out of Food in the Last Year: Never true    Ran Out of Food in the Last Year: Never true  Transportation Needs: No Transportation Needs (10/20/2021)   PRAPARE - Hydrologist (Medical): No    Lack of Transportation (Non-Medical): No  Physical Activity: Not on file  Stress: Not on file  Social Connections: Not on file  Intimate Partner Violence:  Not At Risk (10/20/2021)   Humiliation, Afraid, Rape, and Kick questionnaire    Fear of Current or Ex-Partner: No    Emotionally Abused: No    Physically Abused: No    Sexually Abused: No   Review of Systems:  Review of Systems  Respiratory:  Negative for shortness of breath.   Cardiovascular:  Negative for chest pain.  Gastrointestinal:  Positive for nausea and vomiting. Negative for abdominal pain.    OBJECTIVE:   Temp:  [98.1 F (36.7 C)-98.3 F (36.8 C)] 98.1 F (36.7 C) (09/30 1147) Pulse Rate:  [72-77] 77 (09/30 1147) Resp:  [11-20] 17 (09/30 1147) BP: (111-115)/(45-53) 111/45 (09/30 1147) SpO2:  [87 %-98 %] 87 % (09/30 1147) Weight:  [77.1 kg-83.9 kg] 83.9 kg (09/30 0725) Last BM Date : 10/18/21 Physical Exam Constitutional:      General: She is not in acute distress.    Appearance: She is not ill-appearing, toxic-appearing or diaphoretic.  Cardiovascular:     Rate and Rhythm: Normal rate and regular rhythm.  Pulmonary:     Effort: No respiratory distress.     Breath sounds: Normal breath sounds.  Abdominal:     General: Bowel sounds are normal. There is no distension.     Palpations: Abdomen is soft.     Tenderness: There is no abdominal tenderness. There is no guarding.  Musculoskeletal:     Left lower leg: No edema.  Skin:    General: Skin is warm and dry.  Neurological:     Mental Status: She is alert.     Labs: Recent Labs    10/20/21 0359  WBC 14.7*  HGB 8.6*  HCT 26.9*  PLT 276   BMET Recent Labs    10/20/21 0359  NA 136  K 3.7  CL 103  CO2 24  GLUCOSE 127*  BUN 18  CREATININE 0.72  CALCIUM 9.2   LFT Recent Labs    10/20/21 0359  PROT 6.8  ALBUMIN 3.9  AST 24  ALT 19  ALKPHOS 75  BILITOT 0.6   PT/INR No results for input(s): "LABPROT", "INR" in the last 72 hours.  Diagnostic imaging:  DG Ankle Complete Right  Result Date: 10/20/2021 CLINICAL DATA:  Fall, medial ankle pain, swelling, bruising EXAM: RIGHT ANKLE - COMPLETE  3 VIEW COMPARISON:  None Available. FINDINGS: Fracture through the medial malleolus which is minimally displaced. Overlying soft tissue swelling. The ankle mortise is preserved. IMPRESSION: Minimally displaced fracture through the medial malleolus. Electronically Signed   By: Merilyn Baba M.D.   On: 10/20/2021 03:24    IMPRESSION: Coffee ground emesis  Normocytic anemia Coronary artery disease on dual antiplatelet medication prior to admission   - Now being held Leukocytosis Closed fracture right medial malleolus  PLAN: - No further coffee ground emesis x 4 days - Continue IV PPI therapy - Trend H/H - Monitor bowel movements - As no further coffee ground emesis and recently (10/19/21) took clopidogrel, would favor wait until 10/22/21 for EGD to evaluate - Pending iron studies, may consider EGD and colonoscopy, though could complete colonoscopy as outpatient if appropriate (she is 62) - GI will follow   LOS: 0 days   Danton Clap, [facility] Gastroenterology

## 2021-10-20 NOTE — Consult Note (Signed)
CONSULTATION NOTE   Patient Name: Jamie Hodge Date of Encounter: 10/20/2021 Cardiologist: Elouise Munroe, MD Electrophysiologist: None Advanced Heart Failure: None   Chief Complaint   Hematemesis  Patient Profile   81 yo female with recent stroke and echo demonstrating LV apical aneurysm.  Cardiac CTA showed severe coronary disease and she underwent CABG in 2021.  Maintained on dual antiplatelet therapy, but has a history of nosebleeds in the past.  She now presents with hematemesis and fall.  HPI   Jamie Hodge is a 81 y.o. female who is being seen today for the evaluation of dual antiplatelet therapy at the request of Dr. Nicoletta Ba. 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  She underwent CABG x6.  Subsequently she had been maintained on dual antiplatelet therapy but has had intermittent issues with epistaxis and was on aspirin monotherapy for period of time, then was instructed by cardiac surgery to go back on Plavix indefinitely.  She apparently had done well until this admission when she presented with hematemesis.  She had hematemesis about 4 days ago twice.  She did not want to present to the ER.  Yesterday she was in the bathroom, stood up and passed out.  On admission hemoglobin was noted to be 8.6.  She was admitted and started on Protonix drip for concern of upper GI bleed.  Cardiology is consulted regarding recommendations for dual antiplatelet therapy.  She is denied any chest pain or worsening shortness of breath.  She had a recent PET scan in May 2023, which showed a superior small apical defect consistent with known prior apical aneurysm.  Rest EF and myocardial blood flow were normal.  Unfortunately, the patient was not able to finish the study and therefore findings for ischemia were equivocal.  She also had echo without any findings to explain new  LBBB.  PMHx   Past Medical History:  Diagnosis Date   Coronary artery disease    CVA (cerebral vascular accident) ([place])    Herpes zoster 2012   HERPES ZOSTER 08/13/2010   Qualifier: Diagnosis of  By: Koleen Nimrod MD, Jeffrey     Stroke [facility]) 09/25/2019   TIA (transient ischemic attack) 09/23/2019   Vertigo     Past Surgical History:  Procedure Laterality Date   CARDIAC CATHETERIZATION     CORONARY ARTERY BYPASS GRAFT N/A 11/29/2019   Procedure: CORONARY ARTERY BYPASS GRAFTING (CABG) x 6 ON PUMP. USING LEFT INTERNAL MAMMARY ARTERY, LEFT RADIAL ARTERY AND RIGHT GREATER SAPHENOUS VEIN.;  Surgeon: Wonda Olds, MD;  Location: [place];  Service: Open Heart Surgery;  Laterality: N/A;   ENDOVEIN HARVEST OF GREATER SAPHENOUS VEIN Right 11/29/2019   Procedure: ENDOVEIN HARVEST OF GREATER SAPHENOUS VEIN;  Surgeon: Wonda Olds, MD;  Location: [place];  Service: Open Heart Surgery;  Laterality: Right;   LEFT HEART CATH AND CORONARY ANGIOGRAPHY N/A 11/23/2019   Procedure: LEFT HEART CATH AND CORONARY ANGIOGRAPHY;  Surgeon: Jettie Booze, MD;  Location: [place] CV LAB;  Service: Cardiovascular;  Laterality: N/A;   RADIAL ARTERY HARVEST Left 11/29/2019   Procedure: RADIAL ARTERY HARVEST;  Surgeon: Wonda Olds, MD;  Location: [place];  Service: Open Heart Surgery;  Laterality: Left;   RHINOPLASTY     TEE WITHOUT CARDIOVERSION N/A 11/29/2019   Procedure: TRANSESOPHAGEAL ECHOCARDIOGRAM (TEE);  Surgeon: Wonda Olds, MD;  Location: [place];  Service: Open Heart Surgery;  Laterality: N/A;    FAMHx   Family History  Problem Relation Age of Onset   Unexplained death Mother        Died at the age of 75, no autopsy performed   CAD Father 59   Lung cancer Father 9    SOCHx    reports that she has never smoked. She has never used smokeless tobacco. She reports that she does not drink alcohol and does not use drugs.  Outpatient Medications   No current facility-administered  medications on file prior to encounter.   Current Outpatient Medications on File Prior to Encounter  Medication Sig Dispense Refill   amitriptyline (ELAVIL) 10 MG tablet TAKE 1 TABLET BY MOUTH EVERYDAY AT BEDTIME 90 tablet 3   amLODipine (NORVASC) 2.5 MG tablet Take 2.5 mg by mouth daily.     aspirin EC 81 MG tablet Take 81 mg by mouth daily. Swallow whole.     atorvastatin (LIPITOR) 80 MG tablet TAKE 1 TABLET BY MOUTH EVERY DAY 90 tablet 3   carvedilol (COREG) 6.25 MG tablet Take 1 tablet (6.25 mg total) by mouth 2 (two) times daily. 60 tablet 11   clopidogrel (PLAVIX) 75 MG tablet Take 1 tablet (75 mg total) by mouth daily. 90 tablet 2   gabapentin (NEURONTIN) 300 MG capsule Take 900 mg by mouth at bedtime.     Multiple Vitamin (MULTIVITAMIN ADULT PO) Take 1 tablet by mouth daily.     sertraline (ZOLOFT) 50 MG tablet TAKE 1 TABLET ORALLY ONCE A DAY. MAY THEN INCREASE TO 2 TABLETS     valsartan (DIOVAN) 160 MG tablet Take 1 tablet (160 mg total) by mouth daily. 90 tablet 3   [DISCONTINUED] isosorbide mononitrate (IMDUR) 30 MG 24 hr tablet Take 1 tablet (30 mg total) by mouth daily. 30 tablet 0    Inpatient Medications    Scheduled Meds:  amitriptyline  10 mg Oral QHS   atorvastatin  80 mg Oral Daily   gabapentin  900 mg Oral QHS   sertraline  50 mg Oral Daily    Continuous Infusions:  lactated ringers     pantoprazole 8 mg/hr (10/20/21 0455)    PRN Meds: acetaminophen **OR** acetaminophen, HYDROmorphone (DILAUDID) injection **OR** oxyCODONE, ondansetron **OR** ondansetron (ZOFRAN) IV   ALLERGIES   Allergies  Allergen Reactions   Mango Flavor Anaphylaxis   Contrast Media [Iodinated Contrast Media] Other (See Comments)    Unknown reaction    ROS   Pertinent items noted in HPI and remainder of comprehensive ROS otherwise negative.  Vitals   Vitals:   10/20/21 0239 10/20/21 0244 10/20/21 0248 10/20/21 0725  BP:  (!) 114/53 (!) 115/49 (!) 114/50  Pulse:  72 72 73   Resp:  '20 11 18  '$ Temp:  98.1 F (36.7 C) 98.1 F (36.7 C) 98.3 F (36.8 C)  TempSrc:  Oral  Oral  SpO2: 95% 97% 97% 98%  Weight:   77.1 kg 83.9 kg  Height:   '5\' 4"'$  (1.626 m) '5\' 4"'$  (1.626 m)    Intake/Output Summary (Last 24 hours) at 10/20/2021 0752 Last data filed at 10/20/2021 0720 Gross per 24 hour  Intake 0 ml  Output --  Net 0 ml   Filed Weights   10/20/21 0248 10/20/21 0725  Weight: 77.1 kg 83.9 kg    Physical Exam   General appearance: alert and no distress Neck: no carotid bruit, no JVD, and thyroid not enlarged, symmetric, no tenderness/mass/nodules Lungs:  clear to auscultation bilaterally Heart: regular rate and rhythm, S1, S2 normal, no murmur, click, rub or gallop Abdomen: soft, non-tender; bowel sounds normal; no masses,  no organomegaly Extremities: Right lower extremity is wrapped in a soft cast Pulses: 2+ and symmetric Skin: Pale, warm, dry Neurologic: Grossly normal Psych: Pleasant  Labs   Results for orders placed or performed during the hospital encounter of 10/20/21 (from the past 48 hour(s))  POC occult blood, ED     Status: Abnormal   Collection Time: 10/20/21  3:10 AM  Result Value Ref Range   Fecal Occult Bld POSITIVE (A) NEGATIVE  CBC with Differential     Status: Abnormal   Collection Time: 10/20/21  3:59 AM  Result Value Ref Range   WBC 14.7 (H) 4.0 - 10.5 K/uL   RBC 2.88 (L) 3.87 - 5.11 MIL/uL   Hemoglobin 8.6 (L) 12.0 - 15.0 g/dL   HCT 26.9 (L) 36.0 - 46.0 %   MCV 93.4 80.0 - 100.0 fL   MCH 29.9 26.0 - 34.0 pg   MCHC 32.0 30.0 - 36.0 g/dL   RDW 14.3 11.5 - 15.5 %   Platelets 276 150 - 400 K/uL   nRBC 0.0 0.0 - 0.2 %   Neutrophils Relative % 84 %   Neutro Abs 12.3 (H) 1.7 - 7.7 K/uL   Lymphocytes Relative 10 %   Lymphs Abs 1.5 0.7 - 4.0 K/uL   Monocytes Relative 5 %   Monocytes Absolute 0.7 0.1 - 1.0 K/uL   Eosinophils Relative 0 %   Eosinophils Absolute 0.0 0.0 - 0.5 K/uL   Basophils Relative 0 %   Basophils Absolute 0.1  0.0 - 0.1 K/uL   Immature Granulocytes 1 %   Abs Immature Granulocytes 0.12 (H) 0.00 - 0.07 K/uL    Comment: Performed at [facility], [place] [address] [place], [place] [address]  Comprehensive metabolic panel     Status: Abnormal   Collection Time: 10/20/21  3:59 AM  Result Value Ref Range   Sodium 136 135 - 145 mmol/L   Potassium 3.7 3.5 - 5.1 mmol/L   Chloride 103 98 - 111 mmol/L   CO2 24 22 - 32 mmol/L   Glucose, Bld 127 (H) 70 - 99 mg/dL    Comment: Glucose reference range applies only to samples taken after fasting for at least 8 hours.   BUN 18 8 - 23 mg/dL   Creatinine, Ser 0.72 0.44 - 1.00 mg/dL   Calcium 9.2 8.9 - 10.3 mg/dL   Total Protein 6.8 6.5 - 8.1 g/dL   Albumin 3.9 3.5 - 5.0 g/dL   AST 24 15 - 41 U/L   ALT 19 0 - 44 U/L   Alkaline Phosphatase 75 38 - 126 U/L   Total Bilirubin 0.6 0.3 - 1.2 mg/dL   GFR, Estimated >60 >60 mL/min    Comment: (NOTE) Calculated using the CKD-EPI Creatinine Equation (2021)    Anion gap 9 5 - 15    Comment: Performed at [facility], [place] [address] [place], [place] [address]  Type and screen [place]     Status: None (Preliminary result)   Collection Time: 10/20/21  3:59 AM  Result Value Ref Range   ABO/RH(D) O POS    Antibody Screen NEG    Sample Expiration 10/23/2021,2359    Unit Number [id number]    Blood Component Type RED CELLS,LR    Unit division 00    Status of  Unit ALLOCATED    Transfusion Status OK TO TRANSFUSE    Crossmatch Result      Compatible Performed at [place] [address], [place], [place] [address]    Unit Number [id number]    Blood Component Type RED CELLS,LR    Unit division 00    Status of Unit ALLOCATED    Transfusion Status OK TO TRANSFUSE    Crossmatch Result Compatible   Lactic acid, plasma     Status: None   Collection Time: 10/20/21  3:59 AM  Result Value Ref Range   Lactic Acid, Venous 1.4 0.5 - 1.9  mmol/L    Comment: Performed at [facility], [place] [address], [place], [place] [address]  Prepare RBC (crossmatch)     Status: None   Collection Time: 10/20/21  4:38 AM  Result Value Ref Range   Order Confirmation      ORDER PROCESSED BY BLOOD BANK Performed at [facility], [place] [address] [place], [place] [address]     ECG   Sinus rhythm at 74, LBBB- Personally Reviewed  Telemetry   Sinus rhythm- Personally Reviewed  Radiology   DG Ankle Complete Right  Result Date: 10/20/2021 CLINICAL DATA:  Fall, medial ankle pain, swelling, bruising EXAM: RIGHT ANKLE - COMPLETE 3 VIEW COMPARISON:  None Available. FINDINGS: Fracture through the medial malleolus which is minimally displaced. Overlying soft tissue swelling. The ankle mortise is preserved. IMPRESSION: Minimally displaced fracture through the medial malleolus. Electronically Signed   By: Merilyn Baba M.D.   On: 10/20/2021 03:24    Cardiac Studies   N/A  Impression   Principal Problem:   Hematemesis Active Problems:   Postherpetic neuralgia   CAD (coronary artery disease)   Hyperlipidemia   Hypertension   S/P CABG x 6   Closed right ankle fracture   Symptomatic anemia   Recommendation   Mrs. Aquilinio presented with hematemesis/coffee-ground emesis and was noted to be significantly anemic with hemoglobin of 8.6.  It sounds like she may have had a presyncopal event at home and was quite dizzy.  Not clear if she lost consciousness but she did have severe right ankle pain and was found to have minimally displaced fracture of the right medial malleolus and right tibia.  She has been on dual antiplatelet therapy since CABG in 2021, however she was not on a PPI.  She has had issues with epistaxis in the past.  For now would recommend discontinuing antiplatelets.  Review of the literature, particularly the CAPRIE study and others, suggest lower bleeding rates with clopidogrel and MACE rates  are also lower, however the comparison was with 325 mg aspirin.  No head-to-head comparison with 81 mg aspirin has been performed.  There is a large body of literature suggesting single antiplatelet therapy is much lower risk than dual antiplatelet therapy for more than 6 months.  Once etiology of her GI bleeding is identified and has been treated, would recommend daily Plavix monotherapy addition to PPI.  Thanks for the consultation.  Cardiology will be available as needed.  Time Spent Directly with Patient:  I have spent a total of 45 minutes with the patient reviewing hospital notes, telemetry, EKGs, labs and examining the patient as well as establishing an assessment and plan that was discussed personally with the patient.  > 50% of time was spent in direct patient care.  Length of Stay:  LOS: 0 days   Pixie Casino, MD, [facility], FACP  [place] Director of the Advanced Lipid Disorders &  Cardiovascular Risk Reduction Clinic Diplomate of the American Board of Clinical Lipidology Attending Cardiologist  Direct Dial: [phone]  Fax: [phone]  Website:  [email or web address] 10/20/2021, 7:52 AM

## 2021-10-20 NOTE — Progress Notes (Signed)
Orthopedic Tech Progress Note Patient Details:  MURLE HELLSTROM [birth year] [id number]  Ortho Devices Type of Ortho Device: Short leg splint, Stirrup splint Ortho Device/Splint Location: RUE Ortho Device/Splint Interventions: Ordered, Application, Adjustment  It was difficult to get her foot in good position because pt kept pointing her forward instead of keeping it in 90 angle. Applied splint to the best of my ability.  Post Interventions Patient Tolerated: Well  Edwina Barth 10/20/2021, 6:35 AM

## 2021-10-20 NOTE — Assessment & Plan Note (Signed)
Stable

## 2021-10-20 NOTE — Assessment & Plan Note (Addendum)
-   BP meds on hold in setting of presyncope described on admission - DAPT on hold; patient has been seen by cardiology.  Tentative plan would be for resuming Plavix when safely able (10/3 per GI) - continue lipitor

## 2021-10-20 NOTE — H&P (Signed)
History and Physical    Jamie Hodge LKT:[id number] DOB: 08-06-1940 DOA: 10/20/2021  DOS: the patient was seen and examined on 10/20/2021  PCP: [place], [place]   Patient coming from: Home  I have personally briefly reviewed patient's old medical records in [place]  CC: hematemesis, fall at home HPI: 81 year old female history of coronary disease status post CABG, hypertension, postherpetic neuralgia, hyperlipidemia presents to the ER today after a fall at home.  She states that 4 days ago, she vomited blood x2 in the bathroom.  She did not want to come to the ER.  She has no prior history of peptic ulcer disease.  She has been on dual antiplatelet therapy since her CABG.  According to her cardiology notes, it appears that cardiothoracic surgery wanted continue her on both aspirin and Plavix despite her having 5 vessel CABG.  Cardiology agreed with his continued her on aspirin Plavix.  She has not had any issues with GI bleeding in the past.  Today, while she was in the bathroom, she stood up and passed out.  She suffered a broken ankle.  She came to the ER.  Per the ER provider, she had grossly melanotic stools on rectal examination.  Interestingly though, her BUN and creatinine are normal.  White count 14.7, hemoglobin 8.6, platelets 276 Lactic acid normal at 1.4  Right ankle x-ray demonstrates fracture of the medial malleolus.  Patient started on Protonix drip.  EDP to contact GI for consult.  [facility] hospitalist contacted for admission.   ED Course: EDP reports grossly melanotic stools on rectal exam.  Review of Systems:  Review of Systems  Constitutional: Negative.   HENT: Negative.    Eyes: Negative.   Respiratory: Negative.    Gastrointestinal:  Positive for vomiting.       Hematemesis x 2  Musculoskeletal:  Positive for joint pain.  Skin: Negative.   Neurological:  Positive for dizziness.  Endo/Heme/Allergies: Negative.    Psychiatric/Behavioral: Negative.    All other systems reviewed and are negative.   Past Medical History:  Diagnosis Date   Coronary artery disease    CVA (cerebral vascular accident) ([place])    Herpes zoster 2012   HERPES ZOSTER 08/13/2010   Qualifier: Diagnosis of  By: Koleen Nimrod MD, Jeffrey     Stroke [facility]) 09/25/2019   TIA (transient ischemic attack) 09/23/2019   Vertigo     Past Surgical History:  Procedure Laterality Date   CARDIAC CATHETERIZATION     CORONARY ARTERY BYPASS GRAFT N/A 11/29/2019   Procedure: CORONARY ARTERY BYPASS GRAFTING (CABG) x 6 ON PUMP. USING LEFT INTERNAL MAMMARY ARTERY, LEFT RADIAL ARTERY AND RIGHT GREATER SAPHENOUS VEIN.;  Surgeon: Wonda Olds, MD;  Location: [place];  Service: Open Heart Surgery;  Laterality: N/A;   ENDOVEIN HARVEST OF GREATER SAPHENOUS VEIN Right 11/29/2019   Procedure: ENDOVEIN HARVEST OF GREATER SAPHENOUS VEIN;  Surgeon: Wonda Olds, MD;  Location: [place];  Service: Open Heart Surgery;  Laterality: Right;   LEFT HEART CATH AND CORONARY ANGIOGRAPHY N/A 11/23/2019   Procedure: LEFT HEART CATH AND CORONARY ANGIOGRAPHY;  Surgeon: Jettie Booze, MD;  Location: [place] [facility];  Service: Cardiovascular;  Laterality: N/A;   RADIAL ARTERY HARVEST Left 11/29/2019   Procedure: RADIAL ARTERY HARVEST;  Surgeon: Wonda Olds, MD;  Location: [place];  Service: Open Heart Surgery;  Laterality: Left;   RHINOPLASTY     TEE WITHOUT CARDIOVERSION N/A 11/29/2019   Procedure:  TRANSESOPHAGEAL ECHOCARDIOGRAM (TEE);  Surgeon: Wonda Olds, MD;  Location: [place];  Service: Open Heart Surgery;  Laterality: N/A;     reports that she has never smoked. She has never used smokeless tobacco. She reports that she does not drink alcohol and does not use drugs.  Allergies  Allergen Reactions   Mango Flavor Anaphylaxis   Contrast Media [Iodinated Contrast Media] Other (See Comments)    Unknown reaction    Family History  Problem Relation Age  of Onset   Unexplained death Mother        Died at the age of 81, no autopsy performed   CAD Father 75   Lung cancer Father 27    Prior to Admission medications   Medication Sig Start Date End Date Taking? Authorizing Provider  amitriptyline (ELAVIL) 10 MG tablet TAKE 1 TABLET BY MOUTH EVERYDAY AT BEDTIME 09/20/20   Frann Rider, NP  amLODipine (NORVASC) 2.5 MG tablet Take 2.5 mg by mouth daily.    [provider]  aspirin EC 81 MG tablet Take 81 mg by mouth daily. Swallow whole.    [provider]  atorvastatin (LIPITOR) 80 MG tablet TAKE 1 TABLET BY MOUTH EVERY DAY 10/02/20   Frann Rider, NP  carvedilol (COREG) 6.25 MG tablet Take 1 tablet (6.25 mg total) by mouth 2 (two) times daily. 12/11/20   Elouise Munroe, MD  clopidogrel (PLAVIX) 75 MG tablet Take 1 tablet (75 mg total) by mouth daily. 02/26/21   Elouise Munroe, MD  gabapentin (NEURONTIN) 300 MG capsule Take 900 mg by mouth at bedtime.    [provider]  Multiple Vitamin (MULTIVITAMIN ADULT PO) Take 1 tablet by mouth daily.    [provider]  sertraline (ZOLOFT) 50 MG tablet TAKE 1 TABLET ORALLY ONCE A DAY. MAY THEN INCREASE TO 2 TABLETS    [provider]  valsartan (DIOVAN) 160 MG tablet Take 1 tablet (160 mg total) by mouth daily. 06/04/21   Elouise Munroe, MD  isosorbide mononitrate (IMDUR) 30 MG 24 hr tablet Take 1 tablet (30 mg total) by mouth daily. 11/25/19 12/06/19  Jettie Booze, MD    Physical Exam: Vitals:   10/20/21 0239 10/20/21 0244 10/20/21 0248  BP:  (!) 114/53 (!) 115/49  Pulse:  72 72  Resp:  20 11  Temp:  98.1 F (36.7 C) 98.1 F (36.7 C)  TempSrc:  Oral   SpO2: 95% 97% 97%  Weight:   77.1 kg  Height:   '5\' 4"'$  (1.626 m)    Physical Exam Vitals and nursing note reviewed.  Constitutional:      General: She is not in acute distress.    Appearance: She is obese. She is not ill-appearing, toxic-appearing or diaphoretic.  HENT:     Head:  Normocephalic and atraumatic.     Nose: Nose normal.  Eyes:     General: No scleral icterus. Cardiovascular:     Rate and Rhythm: Normal rate and regular rhythm.  Pulmonary:     Effort: Pulmonary effort is normal.     Breath sounds: Normal breath sounds.  Abdominal:     General: Abdomen is flat. Bowel sounds are normal. There is no distension.     Palpations: Abdomen is soft.     Tenderness: There is no abdominal tenderness. There is no guarding or rebound.  Musculoskeletal:       Legs:  Skin:    General: Skin is warm and dry.  Capillary Refill: Capillary refill takes less than 2 seconds.  Neurological:     General: No focal deficit present.     Mental Status: She is alert and oriented to person, place, and time.      Labs on Admission: I have personally reviewed following labs and imaging studies  CBC: Recent Labs  Lab 10/20/21 0359  WBC 14.7*  NEUTROABS 12.3*  HGB 8.6*  HCT 26.9*  MCV 93.4  PLT 546   Basic Metabolic Panel: Recent Labs  Lab 10/20/21 0359  NA 136  K 3.7  CL 103  CO2 24  GLUCOSE 127*  BUN 18  CREATININE 0.72  CALCIUM 9.2   GFR: Estimated Creatinine Clearance: 57.3 mL/min (by C-G formula based on SCr of 0.72 mg/dL). Liver Function Tests: Recent Labs  Lab 10/20/21 0359  AST 24  ALT 19  ALKPHOS 75  BILITOT 0.6  PROT 6.8  ALBUMIN 3.9   No results for input(s): "LIPASE", "AMYLASE" in the last 168 hours. No results for input(s): "AMMONIA" in the last 168 hours. Coagulation Profile: No results for input(s): "INR", "PROTIME" in the last 168 hours. Cardiac Enzymes: No results for input(s): "CKTOTAL", "CKMB", "CKMBINDEX", "TROPONINI", "TROPONINIHS" in the last 168 hours. BNP (last 3 results) No results for input(s): "PROBNP" in the last 8760 hours. HbA1C: No results for input(s): "HGBA1C" in the last 72 hours. CBG: No results for input(s): "GLUCAP" in the last 168 hours. Lipid Profile: No results for input(s): "CHOL", "HDL",  "LDLCALC", "TRIG", "CHOLHDL", "LDLDIRECT" in the last 72 hours. Thyroid Function Tests: No results for input(s): "TSH", "T4TOTAL", "FREET4", "T3FREE", "THYROIDAB" in the last 72 hours. Anemia Panel: No results for input(s): "VITAMINB12", "FOLATE", "FERRITIN", "TIBC", "IRON", "RETICCTPCT" in the last 72 hours. Urine analysis:    Component Value Date/Time   COLORURINE STRAW (A) 11/25/2019 1300   APPEARANCEUR CLEAR 11/25/2019 1300   LABSPEC 1.003 (L) 11/25/2019 1300   PHURINE 6.0 11/25/2019 1300   GLUCOSEU NEGATIVE 11/25/2019 1300   HGBUR NEGATIVE 11/25/2019 1300   BILIRUBINUR NEGATIVE 11/25/2019 1300   KETONESUR NEGATIVE 11/25/2019 1300   PROTEINUR NEGATIVE 11/25/2019 1300   NITRITE NEGATIVE 11/25/2019 1300   LEUKOCYTESUR SMALL (A) 11/25/2019 1300    Radiological Exams on Admission: I have personally reviewed images DG Ankle Complete Right  Result Date: 10/20/2021 CLINICAL DATA:  Fall, medial ankle pain, swelling, bruising EXAM: RIGHT ANKLE - COMPLETE 3 VIEW COMPARISON:  None Available. FINDINGS: Fracture through the medial malleolus which is minimally displaced. Overlying soft tissue swelling. The ankle mortise is preserved. IMPRESSION: Minimally displaced fracture through the medial malleolus. Electronically Signed   By: Merilyn Baba M.D.   On: 10/20/2021 03:24    EKG: My personal interpretation of EKG shows: NSR, IVCD    Assessment/Plan Principal Problem:   Hematemesis Active Problems:   Closed right ankle fracture   Postherpetic neuralgia   CAD (coronary artery disease)   Hyperlipidemia   Hypertension   S/P CABG x 6   Symptomatic anemia    Assessment and Plan: * Hematemesis Admit to observation medical bed.  Protonix drip 8 mg an hour.  EDP to contact GI for consult.  Keep NPO.  We will hold her aspirin or Plavix.  She likely has peptic ulcer disease from aspirin and Plavix.  Closed right ankle fracture EDP to contact orthopedics. Prn dilaudid 0.5 mg iv or oxycodon  5 mg for pain.  Hypertension Hold blood pressure medications due to symptomatic dizziness.  Hyperlipidemia Continue Lipitor.  80 mg daily.  CAD (coronary artery disease) Continue Lipitor.  We will need to hold her blood pressure medications due to symptomatic dizziness.  Likely this is from her anemia.  No chest pain.  Patient's aspirin and Plavix being managed by cardiology.  Unclear why cardiothoracic surgery want to continue the patient on both aspirin Plavix status post CABG.  But cardiology continue these orders.  Will likely need to stop the Plavix at discharge.  Hold aspirin for now.  Postherpetic neuralgia Continue Neurontin 300 mg at bedtime along with amitriptyline 10 mg at bedtime  Symptomatic anemia Hemoglobin 8.6.  Check TIBC and iron.  Continue with IV fluids.  May need blood transfusion.  Likely due to her hematemesis.  S/P CABG x 6 Stable.   DVT prophylaxis: SCDs Code Status: Full Code Family Communication: no family at bedside  Disposition Plan: return home  Consults called: EDP to consult GI  Admission status: Observation, Med-Surg   Kristopher Oppenheim, DO [facility] 10/20/2021, 5:40 AM

## 2021-10-20 NOTE — Assessment & Plan Note (Signed)
Hold blood pressure medications due to symptomatic dizziness.

## 2021-10-20 NOTE — Assessment & Plan Note (Signed)
Continue Neurontin 300 mg at bedtime along with amitriptyline 10 mg at bedtime

## 2021-10-20 NOTE — Consult Note (Signed)
ORTHOPAEDIC CONSULTATION  REQUESTING PHYSICIAN: Dwyane Dee, MD  Chief Complaint: right ankle pain  HPI: Jamie Hodge is a 81 y.o. female who complains of right ankle pain after passing out at home and falling. She does not remember the event. When she came to, she had severe right ankle pain and inability to bear weight on the right leg. She has been having issues with dizziness and hematemesis for several days but initially tried to avoid coming to the ED. She is s/p CABG in 2021 and takes both Plavix and ASA.   Imaging shows a minimally displaced fracture through the medial malleolus of the right tibia.  She was placed in a splint. Orthopedics was consulted for evaluation.   Last meal yesterday evening. No history of MI, DVT, PE.   + h/o of TIA and CVA in 2021 prior to CABG. Previously ambulatory without the use of assistive devices.  The patient is living at home with her husband.    Past Medical History:  Diagnosis Date   Coronary artery disease    CVA (cerebral vascular accident) ([place])    Herpes zoster 2012   HERPES ZOSTER 08/13/2010   Qualifier: Diagnosis of  By: Koleen Nimrod MD, [place]     Stroke [facility]) 09/25/2019   TIA (transient ischemic attack) 09/23/2019   Vertigo    Past Surgical History:  Procedure Laterality Date   CARDIAC CATHETERIZATION     CORONARY ARTERY BYPASS GRAFT N/A 11/29/2019   Procedure: CORONARY ARTERY BYPASS GRAFTING (CABG) x 6 ON PUMP. USING LEFT INTERNAL MAMMARY ARTERY, LEFT RADIAL ARTERY AND RIGHT GREATER SAPHENOUS VEIN.;  Surgeon: Wonda Olds, MD;  Location: [place];  Service: Open Heart Surgery;  Laterality: N/A;   ENDOVEIN HARVEST OF GREATER SAPHENOUS VEIN Right 11/29/2019   Procedure: ENDOVEIN HARVEST OF GREATER SAPHENOUS VEIN;  Surgeon: Wonda Olds, MD;  Location: [place];  Service: Open Heart Surgery;  Laterality: Right;   LEFT HEART CATH AND CORONARY ANGIOGRAPHY N/A 11/23/2019   Procedure: LEFT HEART CATH AND CORONARY  ANGIOGRAPHY;  Surgeon: Jettie Booze, MD;  Location: [place] [facility];  Service: Cardiovascular;  Laterality: N/A;   RADIAL ARTERY HARVEST Left 11/29/2019   Procedure: RADIAL ARTERY HARVEST;  Surgeon: Wonda Olds, MD;  Location: [place];  Service: Open Heart Surgery;  Laterality: Left;   RHINOPLASTY     TEE WITHOUT CARDIOVERSION N/A 11/29/2019   Procedure: TRANSESOPHAGEAL ECHOCARDIOGRAM (TEE);  Surgeon: Wonda Olds, MD;  Location: [place];  Service: Open Heart Surgery;  Laterality: N/A;   Social History   Socioeconomic History   Marital status: Married    Spouse name: Not on file   Number of children: Not on file   Years of education: Not on file   Highest education level: Not on file  Occupational History   Not on file  Tobacco Use   Smoking status: Never   Smokeless tobacco: Never  Vaping Use   Vaping Use: Never used  Substance and Sexual Activity   Alcohol use: No   Drug use: No   Sexual activity: Never    Birth control/protection: None  Other Topics Concern   Not on file  Social History Narrative   Not on file   Social Determinants of Health   Financial Resource Strain: Not on file  Food Insecurity: Not on file  Transportation Needs: No Transportation Needs (12/06/2019)   PRAPARE - Hydrologist (Medical): No  Lack of Transportation (Non-Medical): No  Physical Activity: Not on file  Stress: Not on file  Social Connections: Not on file   Family History  Problem Relation Age of Onset   Unexplained death Mother        Died at the age of 40, no autopsy performed   CAD Father 36   Lung cancer Father 104   Allergies  Allergen Reactions   Mango Flavor Anaphylaxis   Contrast Media [Iodinated Contrast Media] Other (See Comments)    Unknown reaction   Prior to Admission medications   Medication Sig Start Date End Date Taking? Authorizing Provider  amitriptyline (ELAVIL) 10 MG tablet TAKE 1 TABLET BY MOUTH EVERYDAY AT  BEDTIME 09/20/20   Frann Rider, NP  amLODipine (NORVASC) 2.5 MG tablet Take 2.5 mg by mouth daily.    [provider]  aspirin EC 81 MG tablet Take 81 mg by mouth daily. Swallow whole.    [provider]  atorvastatin (LIPITOR) 80 MG tablet TAKE 1 TABLET BY MOUTH EVERY DAY 10/02/20   Frann Rider, NP  carvedilol (COREG) 6.25 MG tablet Take 1 tablet (6.25 mg total) by mouth 2 (two) times daily. 12/11/20   Elouise Munroe, MD  clopidogrel (PLAVIX) 75 MG tablet Take 1 tablet (75 mg total) by mouth daily. 02/26/21   Elouise Munroe, MD  gabapentin (NEURONTIN) 300 MG capsule Take 900 mg by mouth at bedtime.    [provider]  Multiple Vitamin (MULTIVITAMIN ADULT PO) Take 1 tablet by mouth daily.    [provider]  sertraline (ZOLOFT) 50 MG tablet TAKE 1 TABLET ORALLY ONCE A DAY. MAY THEN INCREASE TO 2 TABLETS    [provider]  valsartan (DIOVAN) 160 MG tablet Take 1 tablet (160 mg total) by mouth daily. 06/04/21   Elouise Munroe, MD  isosorbide mononitrate (IMDUR) 30 MG 24 hr tablet Take 1 tablet (30 mg total) by mouth daily. 11/25/19 12/06/19  Jettie Booze, MD   DG Ankle Complete Right  Result Date: 10/20/2021 CLINICAL DATA:  Fall, medial ankle pain, swelling, bruising EXAM: RIGHT ANKLE - COMPLETE 3 VIEW COMPARISON:  None Available. FINDINGS: Fracture through the medial malleolus which is minimally displaced. Overlying soft tissue swelling. The ankle mortise is preserved. IMPRESSION: Minimally displaced fracture through the medial malleolus. Electronically Signed   By: Merilyn Baba M.D.   On: 10/20/2021 03:24    Positive ROS: All other systems have been reviewed and were otherwise negative with the exception of those mentioned in the HPI and as above.  Objective: Labs cbc Recent Labs    10/20/21 0359  WBC 14.7*  HGB 8.6*  HCT 26.9*  PLT 276    Labs inflam No results for input(s): "CRP" in the last 72 hours.  Invalid  input(s): "ESR"  Labs coag No results for input(s): "INR", "PTT" in the last 72 hours.  Invalid input(s): "PT"  Recent Labs    10/20/21 0359  NA 136  K 3.7  CL 103  CO2 24  GLUCOSE 127*  BUN 18  CREATININE 0.72  CALCIUM 9.2    Physical Exam: Vitals:   10/20/21 0248 10/20/21 0725  BP: (!) 115/49 (!) 114/50  Pulse: 72 73  Resp: 11 18  Temp: 98.1 F (36.7 C) 98.3 F (36.8 C)  SpO2: 97% 98%   General: Alert, no acute distress. Laying in bed, calm, somewhat sleepy Mental status: Alert and Oriented x3 Neurologic: Speech Clear and organized, no gross focal findings  or movement disorder appreciated. Respiratory: No cyanosis, no use of accessory musculature Cardiovascular: No pedal edema GI: Abdomen is soft and non-tender, non-distended. Skin: Warm and dry.   Extremities: Warm and well perfused w/o edema Psychiatric: Patient is competent for consent with normal mood and affect  MUSCULOSKELETAL:  Splint to RLE, can move toes, calf soft and compressible, NVI  Other extremities are atraumatic with painless ROM and NVI.  Assessment / Plan: Principal Problem:   Hematemesis Active Problems:   Postherpetic neuralgia   CAD (coronary artery disease)   Hyperlipidemia   Hypertension   S/P CABG x 6   Closed right ankle fracture   Symptomatic anemia     Will plan to treat this fracture non-operative with immobilization. Will remain in a splint for 1 week and then transition to either a cast or CAM boot for several weeks. Ok to work with PT/OT on mobilization while maintaining NWB status.    Weightbearing: NWB RLE Insicional and dressing care: Reinforce dressings as needed Orthopedic device(s): splint Showering: hold VTE prophylaxis: on hold until source of GI bleed identified Pain control: Tylenol, Oxy, Dilaudid PRN Follow - up plan:  1 week for new xrays and recheck Contact information:  Edmonia Lynch MD, [facility] PA-C   Britt Bottom PA-C Office  [phone] 10/20/2021 8:13 AM

## 2021-10-20 NOTE — ED Notes (Signed)
Ortho tech called for splinting. They advised that it would be a little while before they could come, but that they would come as soon as they were able.

## 2021-10-20 NOTE — Assessment & Plan Note (Addendum)
-   Hemoglobin 8.6 g/dL on arrival.  Appears to have a baseline around 10 to 11 g/dL - See hematemesis -Iron studies also low.  Replete with INFeD on 10/21/2021

## 2021-10-20 NOTE — Progress Notes (Signed)
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  X-ray on admission showed a minimally displaced medial malleolus fracture of the right. She follows with cardiology outpatient and has remained on DAPT per cardiac surgery recommendations. Due to her possible GIB and DAPT therapy she is being evaluated by GI and cardiology as well as by orthopedic surgery for attention to the right ankle fracture.   Interval History:  Patient resting in bed when seen this morning.  Her son present bedside as well. Reviewed imaging findings and her overall clinical presentation.  Plan for probable EGD during hospitalization with timing to be determined.  She is also undergoing evaluations with orthopedic surgery regarding ankle fracture and cardiology evaluation to determine plan for dual antiplatelet therapy going forward.  Assessment and Plan: * Hematemesis - concern is for possible UGIB given DAPT therapy; denies NSAID use (aside from asa), tobacco, etoh.  - holding DAPT for now - GI following, appreciate assistance; awaiting washout of Plavix and tentative plan for EGD on 10/22/2021 -Monitoring for any further GI bleeding symptoms and will trend hemoglobin as needed  Symptomatic anemia - Hemoglobin 8.6 g/dL on arrival.  Appears to have a baseline around 10 to 11 g/dL - Continue trending hemoglobin - See hematemesis  Closed right ankle fracture - Minimally displaced right malleolus fracture - Stirrup splint currently in place - Follow-up orthopedic recommendations.  Possible surgical  intervention  CAD (coronary artery disease) - BP meds on hold in setting of presyncope described on admission - DAPT on hold; will ask cardiology to assist with which to continue as may no longer need dual therapy  - continue lipitor  Hypertension Hold blood pressure medications due to symptomatic dizziness.  S/P CABG x 6 Stable.  Hyperlipidemia Continue Lipitor.  80 mg daily.  Postherpetic neuralgia Continue Neurontin 300 mg at bedtime along with amitriptyline 10 mg at bedtime   Old records reviewed in assessment of this patient  Antimicrobials:   DVT prophylaxis:  SCDs Start: 10/20/21 0720   Code Status:   Code Status: Full Code  Mobility Assessment (last 72 hours)     Mobility Assessment     Row Name 10/20/21 0721 10/20/21 0720         Does patient have an order for bedrest or is patient medically unstable Yes- Bedfast (Level 1) - Complete Yes- Bedfast (Level 1) - Complete               Barriers to discharge: none Disposition Plan:  Home Status is: Obs  Objective: Blood pressure (!) 111/45, pulse 77, temperature 98.1 F (36.7 C), temperature source Oral, resp. rate 18, height '5\' 4"'$  (1.626 m), weight 83.9 kg, SpO2 95 %.  Examination:  Physical Exam Constitutional:      Appearance: Normal appearance.  HENT:     Head: Normocephalic and atraumatic.     Mouth/Throat:     Mouth: Mucous membranes are moist.  Eyes:     Extraocular Movements: Extraocular movements intact.  Cardiovascular:     Rate and Rhythm: Normal rate  and regular rhythm.  Pulmonary:     Effort: Pulmonary effort is normal.     Breath sounds: Normal breath sounds.  Abdominal:     General: Bowel sounds are normal. There is no distension.     Palpations: Abdomen is soft.     Tenderness: There is no abdominal tenderness.  Musculoskeletal:     Cervical back: Normal range of motion and neck supple.     Comments: RLE in stirrup splint  Neurological:     Mental Status: She is alert.      Comments: Sensation intact in right toes; able to wiggle toes  Psychiatric:        Mood and Affect: Mood normal.        Behavior: Behavior normal.      Consultants:  GI Cardiology Orthopedic surgery  Procedures:    Data Reviewed: Results for orders placed or performed during the hospital encounter of 10/20/21 (from the past 24 hour(s))  POC occult blood, ED     Status: Abnormal   Collection Time: 10/20/21  3:10 AM  Result Value Ref Range   Fecal Occult Bld POSITIVE (A) NEGATIVE  CBC with Differential     Status: Abnormal   Collection Time: 10/20/21  3:59 AM  Result Value Ref Range   WBC 14.7 (H) 4.0 - 10.5 K/uL   RBC 2.88 (L) 3.87 - 5.11 MIL/uL   Hemoglobin 8.6 (L) 12.0 - 15.0 g/dL   HCT 26.9 (L) 36.0 - 46.0 %   MCV 93.4 80.0 - 100.0 fL   MCH 29.9 26.0 - 34.0 pg   MCHC 32.0 30.0 - 36.0 g/dL   RDW 14.3 11.5 - 15.5 %   Platelets 276 150 - 400 K/uL   nRBC 0.0 0.0 - 0.2 %   Neutrophils Relative % 84 %   Neutro Abs 12.3 (H) 1.7 - 7.7 K/uL   Lymphocytes Relative 10 %   Lymphs Abs 1.5 0.7 - 4.0 K/uL   Monocytes Relative 5 %   Monocytes Absolute 0.7 0.1 - 1.0 K/uL   Eosinophils Relative 0 %   Eosinophils Absolute 0.0 0.0 - 0.5 K/uL   Basophils Relative 0 %   Basophils Absolute 0.1 0.0 - 0.1 K/uL   Immature Granulocytes 1 %   Abs Immature Granulocytes 0.12 (H) 0.00 - 0.07 K/uL  Comprehensive metabolic panel     Status: Abnormal   Collection Time: 10/20/21  3:59 AM  Result Value Ref Range   Sodium 136 135 - 145 mmol/L   Potassium 3.7 3.5 - 5.1 mmol/L   Chloride 103 98 - 111 mmol/L   CO2 24 22 - 32 mmol/L   Glucose, Bld 127 (H) 70 - 99 mg/dL   BUN 18 8 - 23 mg/dL   Creatinine, Ser 0.72 0.44 - 1.00 mg/dL   Calcium 9.2 8.9 - 10.3 mg/dL   Total Protein 6.8 6.5 - 8.1 g/dL   Albumin 3.9 3.5 - 5.0 g/dL   AST 24 15 - 41 U/L   ALT 19 0 - 44 U/L   Alkaline Phosphatase 75 38 - 126 U/L   Total Bilirubin 0.6 0.3 - 1.2 mg/dL   GFR, Estimated >60 >60 mL/min   Anion gap 9 5 -  15  Type and screen [place]     Status: None (Preliminary result)   Collection Time: 10/20/21  3:59 AM  Result Value Ref Range   ABO/RH(D) O POS    Antibody Screen NEG    Sample Expiration  10/23/2021,2359    Unit Number [id number]    Blood Component Type RED CELLS,LR    Unit division 00    Status of Unit ALLOCATED    Transfusion Status OK TO TRANSFUSE    Crossmatch Result      Compatible Performed at [facility] [address], [place], [place] [address]    Unit Number [id number]    Blood Component Type RED CELLS,LR    Unit division 00    Status of Unit ALLOCATED    Transfusion Status OK TO TRANSFUSE    Crossmatch Result Compatible   Lactic acid, plasma     Status: None   Collection Time: 10/20/21  3:59 AM  Result Value Ref Range   Lactic Acid, Venous 1.4 0.5 - 1.9 mmol/L  Prepare RBC (crossmatch)     Status: None   Collection Time: 10/20/21  4:38 AM  Result Value Ref Range   Order Confirmation      ORDER PROCESSED BY BLOOD BANK Performed at [facility], [place] [address], [place], [place] [address]     I have Reviewed nursing notes, Vitals, and Lab results since pt's last encounter. Pertinent lab results : see above I have ordered test including BMP, CBC, Mg I have reviewed the last note from staff over past 24 hours I have discussed pt's care plan and test results with nursing staff, case manager   LOS: 0 days   Dwyane Dee, MD [facility] 10/20/2021, 1:52 PM

## 2021-10-20 NOTE — Plan of Care (Signed)
  Problem: Clinical Measurements: Goal: Diagnostic test results will improve Outcome: Progressing   Problem: Clinical Measurements: Goal: Respiratory complications will improve Outcome: Progressing   Problem: Clinical Measurements: Goal: Cardiovascular complication will be avoided Outcome: Progressing   Problem: Elimination: Goal: Will not experience complications related to bowel motility Outcome: Progressing   Problem: Elimination: Goal: Will not experience complications related to urinary retention Outcome: Progressing

## 2021-10-20 NOTE — Progress Notes (Signed)
Patient was bladder scanned at 4:28 PM. Bladder scan: 268 mL. Patient thinks she will feel a greater urge to void after she eats dinner/drinks more. Dinner tray arriving to patient's room soon. Notified Dr. Sabino Gasser, MD to inform about patient's bladder scan results.  New order: Bladder scan Q6 hr. Notify primary team if PVR > 350 mL.  Will continue to monitor patient.

## 2021-10-20 NOTE — ED Triage Notes (Signed)
Pt BIB [facility] from home. Pt reports passing out on toilet on Tuesday and waking up in coffee ground emesis. Pt c/o lightheadedness.  Pt hypotensive upon ems arrival. Pt denies nausea.

## 2021-10-20 NOTE — ED Provider Notes (Signed)
[facility] DEPT Provider Note   CSN: [id number] Arrival date & time: 10/20/21  0217     History  Chief Complaint  Patient presents with   Dizziness    Jamie Hodge is a 81 y.o. female.  The history is provided by the patient.  Dizziness She has history of hypertension, hyperlipidemia, coronary artery disease on clopidogrel and comes in because of dizziness and injury to right ankle.  She states that 3 days ago, she had vomited blood with clots.  Since then, she has had a small amount of stool which was dark black.  She notices that she is dizzy whenever she stands up.  Today, she tried to stand up and she injured her right ankle although she does not remember mechanism of injury.  It is now painful to put weight on her ankle.  She came in because her husband was no longer able to take care of her at home.  She states that several months ago, she had another episode where she vomited blood.  She has never been evaluated by a gastroenterologist and has not had any endoscopy.  She denies ethanol use, NSAID use.  She denies any abdominal pain.   Home Medications Prior to Admission medications   Medication Sig Start Date End Date Taking? Authorizing Provider  amitriptyline (ELAVIL) 10 MG tablet TAKE 1 TABLET BY MOUTH EVERYDAY AT BEDTIME 09/20/20   Frann Rider, NP  amLODipine (NORVASC) 2.5 MG tablet Take 2.5 mg by mouth daily.    [provider]  aspirin EC 81 MG tablet Take 81 mg by mouth daily. Swallow whole.    [provider]  atorvastatin (LIPITOR) 80 MG tablet TAKE 1 TABLET BY MOUTH EVERY DAY 10/02/20   Frann Rider, NP  carvedilol (COREG) 6.25 MG tablet Take 1 tablet (6.25 mg total) by mouth 2 (two) times daily. 12/11/20   Elouise Munroe, MD  clopidogrel (PLAVIX) 75 MG tablet Take 1 tablet (75 mg total) by mouth daily. 02/26/21   Elouise Munroe, MD  gabapentin (NEURONTIN) 300 MG capsule Take 900 mg by mouth at bedtime.     [provider]  Multiple Vitamin (MULTIVITAMIN ADULT PO) Take 1 tablet by mouth daily.    [provider]  sertraline (ZOLOFT) 50 MG tablet TAKE 1 TABLET ORALLY ONCE A DAY. MAY THEN INCREASE TO 2 TABLETS    [provider]  valsartan (DIOVAN) 160 MG tablet Take 1 tablet (160 mg total) by mouth daily. 06/04/21   Elouise Munroe, MD  isosorbide mononitrate (IMDUR) 30 MG 24 hr tablet Take 1 tablet (30 mg total) by mouth daily. 11/25/19 12/06/19  Jettie Booze, MD      Allergies    Mango flavor and Contrast media [iodinated contrast media]    Review of Systems   Review of Systems  Neurological:  Positive for dizziness.  All other systems reviewed and are negative.   Physical Exam Updated Vital Signs BP (!) 115/49 (BP Location: Left Arm)   Pulse 72   Temp 98.1 F (36.7 C)   Resp 11   Ht '5\' 4"'$  (1.626 m)   Wt 77.1 kg   SpO2 97%   BMI 29.18 kg/m  Physical Exam Vitals and nursing note reviewed. Exam conducted with a chaperone present.   81 year old female, resting comfortably and in no acute distress. Vital signs are normal. Oxygen saturation is 97%, which is normal. Head is normocephalic and atraumatic. PERRLA, EOMI. Oropharynx is  clear.  Conjunctivae are slightly pale. Neck is nontender and supple without adenopathy or JVD. Back is nontender and there is no CVA tenderness. Lungs are clear without rales, wheezes, or rhonchi. Chest is nontender. Heart has regular rate and rhythm without murmur. Abdomen is soft, flat, nontender. Rectal: Normal sphincter tone, grossly melanotic stool present. Extremities: There is diffuse swelling of the right ankle with tenderness diffusely.  There is no instability of the ankle mortise.  Distal neurovascular exam is intact with strong dorsalis pedis pulse and normal sensation and prompt capillary refill. Skin is warm and dry without rash. Neurologic: Mental status is normal, cranial nerves are intact, moves all  extremities equally.  ED Results / Procedures / Treatments   Labs (all labs ordered are listed, but only abnormal results are displayed) Labs Reviewed  SURGICAL PCR SCREEN - Abnormal; Notable for the following components:      Result Value   Staphylococcus aureus POSITIVE (*)    All other components within normal limits  CBC WITH DIFFERENTIAL/PLATELET - Abnormal; Notable for the following components:   WBC 14.7 (*)    RBC 2.88 (*)    Hemoglobin 8.6 (*)    HCT 26.9 (*)    Neutro Abs 12.3 (*)    Abs Immature Granulocytes 0.12 (*)    All other components within normal limits  COMPREHENSIVE METABOLIC PANEL - Abnormal; Notable for the following components:   Glucose, Bld 127 (*)    All other components within normal limits  HEMOGLOBIN AND HEMATOCRIT, BLOOD - Abnormal; Notable for the following components:   Hemoglobin 8.2 (*)    HCT 25.7 (*)    All other components within normal limits  POC OCCULT BLOOD, ED - Abnormal; Notable for the following components:   Fecal Occult Bld POSITIVE (*)    All other components within normal limits  LACTIC ACID, PLASMA  IRON AND TIBC  BASIC METABOLIC PANEL  CBC WITH DIFFERENTIAL/PLATELET  MAGNESIUM  TYPE AND SCREEN  PREPARE RBC (CROSSMATCH)    EKG EKG Interpretation  Date/Time:  Saturday October 20 2021 02:50:52 EDT Ventricular Rate:  74 PR Interval:  195 QRS Duration: 147 QT Interval:  448 QTC Calculation: 498 R Axis:   9 Text Interpretation: Sinus rhythm IVCD, consider atypical LBBB When compared with ECG of 11/30/2019, Left bundle branch block is now present Confirmed by Delora Fuel ([id number]) on 10/20/2021 3:36:18 AM  Radiology DG Ankle Complete Right  Result Date: 10/20/2021 CLINICAL DATA:  Fall, medial ankle pain, swelling, bruising EXAM: RIGHT ANKLE - COMPLETE 3 VIEW COMPARISON:  None Available. FINDINGS: Fracture through the medial malleolus which is minimally displaced. Overlying soft tissue swelling. The ankle mortise is  preserved. IMPRESSION: Minimally displaced fracture through the medial malleolus. Electronically Signed   By: Merilyn Baba M.D.   On: 10/20/2021 03:24    Procedures .Splint Application  Date/Time: 10/20/2021 3:42 AM  Performed by: Delora Fuel, MD Authorized by: Delora Fuel, MD   Consent:    Consent obtained:  Verbal   Consent given by:  Patient   Risks, benefits, and alternatives were discussed: yes     Risks discussed:  Discoloration and numbness   Alternatives discussed:  Alternative treatment Universal protocol:    Procedure explained and questions answered to patient or proxy's satisfaction: yes     Relevant documents present and verified: yes     Test results available: yes     Imaging studies available: yes     Required blood products, implants, devices, and special  equipment available: yes     Site/side marked: yes     Immediately prior to procedure a time out was called: yes     Patient identity confirmed:  Verbally with patient and arm band Pre-procedure details:    Distal neurologic exam:  Normal   Distal perfusion: brisk capillary refill   Procedure details:    Location:  Ankle   Ankle location:  R ankle   Strapping: no     Splint type:  Ankle stirrup   Supplies:  Elastic bandage and fiberglass Post-procedure details:    Distal neurologic exam:  Unchanged   Procedure completion:  Tolerated well, no immediate complications   Post-procedure imaging: not applicable     Cardiac monitor shows normal sinus rhythm, per my interpretation.  Medications Ordered in ED Medications  pantoprazole (PROTONIX) 80 mg /NS 100 mL IVPB (has no administration in time range)  pantoprozole (PROTONIX) 80 mg /NS 100 mL infusion (has no administration in time range)  pantoprazole (PROTONIX) injection 40 mg (has no administration in time range)    ED Course/ Medical Decision Making/ A&P                           Medical Decision Making Amount and/or Complexity of Data  Reviewed Labs: ordered. Radiology: ordered.  Risk Prescription drug management. Decision regarding hospitalization.   Upper gastrointestinal bleed.  Right ankle injury, rule out fracture.  Stool Hemoccult is positive.  Because of bleeding could be peptic ulcer, gastritis, AV malformation.  No history of cirrhosis to suggest esophageal varices.  I have ordered pantoprazole infusion  Ankle x-ray shows nondisplaced fracture of the medial malleolus.  I have ordered a stirrup splint be applied.  I have reviewed and interpreted her laboratory test, and my interpretation is anemia with 2 g drop compared with 12/04/2019, leukocytosis likely stress related, elevated random glucose on which is also likely stress related.  I reviewed and interpreted her electrocardiogram, and my interpretation is intraventricular conduction delay likely atypical left bundle branch block which is new compared with 11/30/2019.  Patient is remained hemodynamically stable, no further episodes of bleeding.  Case is discussed with Dr. Bridgett Larsson of [facility], who agrees to admit the patient.  Case is discussed with Dr. Percell Miller of orthopedics, who agrees to see the patient in consultation rule out guarding her ankle fracture.  Secure chat has been sent to Dr. Randel Pigg of gastroenterology service to see the patient in consultation.  Final Clinical Impression(s) / ED Diagnoses Final diagnoses:  Upper gastrointestinal bleeding  Traumatic closed fracture of medial malleolus with minimal displacement, right, initial encounter    Rx / DC Orders ED Discharge Orders     None         Delora Fuel, MD 61/60/73 2310

## 2021-10-20 NOTE — Assessment & Plan Note (Addendum)
-   concern is for possible UGIB given DAPT therapy; denies NSAID use (aside from asa), tobacco, etoh.  - holding DAPT for now - GI following, appreciate assistance; awaiting washout of Plavix and tentative plan for EGD on 10/22/2021 -Monitoring for any further GI bleeding symptoms and will trend hemoglobin as needed - no further bleeding reported; likely lag of Hgb, down to 7.5 g/dL on 10/1 and iron studies also low

## 2021-10-20 NOTE — Assessment & Plan Note (Signed)
Continue Lipitor 80 mg daily.

## 2021-10-20 NOTE — Progress Notes (Signed)
Pt stable at this time. Ortho provider [place] notified of need for clarification of plan for pt ankle fx. According to provider no physical therapy is needed today and pt may have surgery tomorrow. No needs at this time. Rn will continue to monitor.

## 2021-10-20 NOTE — Consult Note (Cosign Needed Addendum)
     BRIEF ORTHOPEDIC CONSULTATION NOTE   Chief Complaint: right ankle pain  HPI: Jamie Hodge is a 81 y.o. female who complains of right ankle pain after falling at home. She has been having issues with dizziness and hematemesis for several days now and passed out when getting off the toilet causing the fall. Source of GI bleed not yet identified. Has been on Plavix and ASA due to h/o CABG.  She has been placed in a short leg splint and kept NPO.    DG Ankle Complete Right  Result Date: 10/20/2021 CLINICAL DATA:  Fall, medial ankle pain, swelling, bruising EXAM: RIGHT ANKLE - COMPLETE 3 VIEW COMPARISON:  None Available. FINDINGS: Fracture through the medial malleolus which is minimally displaced. Overlying soft tissue swelling. The ankle mortise is preserved. IMPRESSION: Minimally displaced fracture through the medial malleolus. Electronically Signed   By: Merilyn Baba M.D.   On: 10/20/2021 03:24     Assessment / Plan: Based on imaging and review of patient chart, will tentatively plan for non-operative management of this fracture with a week in a splint and then likely transition to a CAM boot for several weeks. May be able to progress some early WB depending on progress and xrays but for now keep NWB.   Currently NPO. If allowed to eat today, then needs to be made NPO again at midnight tonight incase we change our mind and decide to do surgery tomorrow morning/afternoon. If surgery is needed, we would place a screw across the fracture site of the medial malleolus.    Weightbearing: NWB RLE Orthopedic device(s): splint VTE prophylaxis: on hold per cardiology until GI bleed source found Pain control: per primary Contact information:  Edmonia Lynch MD, Aggie Moats PA-C   Full formal consult to follow tomorrow during morning rounds.    Britt Bottom, PA-C Office [phone] 10/20/2021, 9:20 AM

## 2021-10-20 NOTE — Assessment & Plan Note (Addendum)
-   Minimally displaced right malleolus fracture - Stirrup splint currently in place; continue x 1 week then possible cast vs CAM boot per ortho -NWB to RLE per ortho -Outpatient follow-up with orthopedic surgery in 1 week for new x-rays - patient and family still amenable for home with [facility] at discharge; they declined SNF

## 2021-10-20 NOTE — Hospital Course (Signed)
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  X-ray on admission showed a minimally displaced medial malleolus fracture of the right. She follows with cardiology outpatient and has remained on DAPT per cardiac surgery recommendations. Due to her possible GIB and DAPT therapy she is being evaluated by GI and cardiology as well as by orthopedic surgery for attention to the right ankle fracture.

## 2021-10-20 NOTE — Subjective & Objective (Signed)
CC: hematemesis, fall at home HPI: 81 year old female history of coronary disease status post CABG, hypertension, postherpetic neuralgia, hyperlipidemia presents to the ER today after a fall at home.  She states that 4 days ago, she vomited blood x2 in the bathroom.  She did not want to come to the ER.  She has no prior history of peptic ulcer disease.  She has been on dual antiplatelet therapy since her CABG.  According to her cardiology notes, it appears that cardiothoracic surgery wanted continue her on both aspirin and Plavix despite her having 5 vessel CABG.  Cardiology agreed with his continued her on aspirin Plavix.  She has not had any issues with GI bleeding in the past.  Today, while she was in the bathroom, she stood up and passed out.  She suffered a broken ankle.  She came to the ER.  Per the ER provider, she had grossly melanotic stools on rectal examination.  Interestingly though, her BUN and creatinine are normal.  White count 14.7, hemoglobin 8.6, platelets 276 Lactic acid normal at 1.4  Right ankle x-ray demonstrates fracture of the medial malleolus.  Patient started on Protonix drip.  EDP to contact GI for consult.  [facility] contacted for admission.

## 2021-10-21 DIAGNOSIS — Z9102 Food additives allergy status: Secondary | ICD-10-CM | POA: Diagnosis not present

## 2021-10-21 DIAGNOSIS — K449 Diaphragmatic hernia without obstruction or gangrene: Secondary | ICD-10-CM | POA: Diagnosis not present

## 2021-10-21 DIAGNOSIS — D509 Iron deficiency anemia, unspecified: Secondary | ICD-10-CM

## 2021-10-21 DIAGNOSIS — Z91041 Radiographic dye allergy status: Secondary | ICD-10-CM | POA: Diagnosis not present

## 2021-10-21 DIAGNOSIS — B0229 Other postherpetic nervous system involvement: Secondary | ICD-10-CM | POA: Diagnosis present

## 2021-10-21 DIAGNOSIS — I2510 Atherosclerotic heart disease of native coronary artery without angina pectoris: Secondary | ICD-10-CM | POA: Diagnosis present

## 2021-10-21 DIAGNOSIS — R338 Other retention of urine: Secondary | ICD-10-CM | POA: Diagnosis not present

## 2021-10-21 DIAGNOSIS — I10 Essential (primary) hypertension: Secondary | ICD-10-CM | POA: Diagnosis present

## 2021-10-21 DIAGNOSIS — K922 Gastrointestinal hemorrhage, unspecified: Secondary | ICD-10-CM | POA: Diagnosis present

## 2021-10-21 DIAGNOSIS — Z7902 Long term (current) use of antithrombotics/antiplatelets: Secondary | ICD-10-CM | POA: Diagnosis not present

## 2021-10-21 DIAGNOSIS — Z8249 Family history of ischemic heart disease and other diseases of the circulatory system: Secondary | ICD-10-CM | POA: Diagnosis not present

## 2021-10-21 DIAGNOSIS — S82891A Other fracture of right lower leg, initial encounter for closed fracture: Secondary | ICD-10-CM | POA: Diagnosis not present

## 2021-10-21 DIAGNOSIS — D72829 Elevated white blood cell count, unspecified: Secondary | ICD-10-CM | POA: Diagnosis present

## 2021-10-21 DIAGNOSIS — W182XXA Fall in (into) shower or empty bathtub, initial encounter: Secondary | ICD-10-CM | POA: Diagnosis present

## 2021-10-21 DIAGNOSIS — Z79899 Other long term (current) drug therapy: Secondary | ICD-10-CM | POA: Diagnosis not present

## 2021-10-21 DIAGNOSIS — Y92009 Unspecified place in unspecified non-institutional (private) residence as the place of occurrence of the external cause: Secondary | ICD-10-CM | POA: Diagnosis not present

## 2021-10-21 DIAGNOSIS — S8251XA Displaced fracture of medial malleolus of right tibia, initial encounter for closed fracture: Secondary | ICD-10-CM | POA: Diagnosis present

## 2021-10-21 DIAGNOSIS — K222 Esophageal obstruction: Secondary | ICD-10-CM | POA: Diagnosis not present

## 2021-10-21 DIAGNOSIS — K920 Hematemesis: Secondary | ICD-10-CM | POA: Diagnosis not present

## 2021-10-21 DIAGNOSIS — K259 Gastric ulcer, unspecified as acute or chronic, without hemorrhage or perforation: Secondary | ICD-10-CM | POA: Diagnosis not present

## 2021-10-21 DIAGNOSIS — Z8673 Personal history of transient ischemic attack (TIA), and cerebral infarction without residual deficits: Secondary | ICD-10-CM | POA: Diagnosis not present

## 2021-10-21 DIAGNOSIS — K254 Chronic or unspecified gastric ulcer with hemorrhage: Secondary | ICD-10-CM | POA: Diagnosis present

## 2021-10-21 DIAGNOSIS — E785 Hyperlipidemia, unspecified: Secondary | ICD-10-CM | POA: Diagnosis present

## 2021-10-21 DIAGNOSIS — Z7982 Long term (current) use of aspirin: Secondary | ICD-10-CM | POA: Diagnosis not present

## 2021-10-21 DIAGNOSIS — Y93E1 Activity, personal bathing and showering: Secondary | ICD-10-CM | POA: Diagnosis not present

## 2021-10-21 DIAGNOSIS — Z7401 Bed confinement status: Secondary | ICD-10-CM | POA: Diagnosis not present

## 2021-10-21 DIAGNOSIS — Z951 Presence of aortocoronary bypass graft: Secondary | ICD-10-CM | POA: Diagnosis not present

## 2021-10-21 LAB — BASIC METABOLIC PANEL
Anion gap: 5 (ref 5–15)
BUN: 12 mg/dL (ref 8–23)
CO2: 26 mmol/L (ref 22–32)
Calcium: 8.2 mg/dL — ABNORMAL LOW (ref 8.9–10.3)
Chloride: 106 mmol/L (ref 98–111)
Creatinine, Ser: 0.85 mg/dL (ref 0.44–1.00)
GFR, Estimated: >60 mL/min (ref >60)
Glucose, Bld: 118 mg/dL — ABNORMAL HIGH (ref 70–99)
Potassium: 3.9 mmol/L (ref 3.5–5.1)
Sodium: 137 mmol/L (ref 135–145)

## 2021-10-21 LAB — IRON AND TIBC
Iron: 14 ug/dL — ABNORMAL LOW (ref 28–170)
Saturation Ratios: 5 % — ABNORMAL LOW (ref 10.4–31.8)
TIBC: 294 ug/dL (ref 250–450)
UIBC: 280 ug/dL

## 2021-10-21 LAB — CBC WITH DIFFERENTIAL/PLATELET
Abs Immature Granulocytes: 0.06 10*3/uL (ref 0.00–0.07)
Basophils Absolute: 0.1 10*3/uL (ref 0.0–0.1)
Basophils Relative: 0 %
Eosinophils Absolute: 0.2 10*3/uL (ref 0.0–0.5)
Eosinophils Relative: 2 %
HCT: 23.8 % — ABNORMAL LOW (ref 36.0–46.0)
Hemoglobin: 7.5 g/dL — ABNORMAL LOW (ref 12.0–15.0)
Immature Granulocytes: 1 %
Lymphocytes Relative: 21 %
Lymphs Abs: 2.4 10*3/uL (ref 0.7–4.0)
MCH: 30.5 pg (ref 26.0–34.0)
MCHC: 31.5 g/dL (ref 30.0–36.0)
MCV: 96.7 fL (ref 80.0–100.0)
Monocytes Absolute: 1 10*3/uL (ref 0.1–1.0)
Monocytes Relative: 9 %
Neutro Abs: 7.7 10*3/uL (ref 1.7–7.7)
Neutrophils Relative %: 67 %
Platelets: 259 10*3/uL (ref 150–400)
RBC: 2.46 MIL/uL — ABNORMAL LOW (ref 3.87–5.11)
RDW: 14.6 % (ref 11.5–15.5)
WBC: 11.4 10*3/uL — ABNORMAL HIGH (ref 4.0–10.5)
nRBC: 0.2 % (ref 0.0–0.2)

## 2021-10-21 LAB — MAGNESIUM: Magnesium: 1.8 mg/dL (ref 1.7–2.4)

## 2021-10-21 LAB — HEMOGLOBIN AND HEMATOCRIT, BLOOD
HCT: 23 % — ABNORMAL LOW (ref 36.0–46.0)
Hemoglobin: 7.2 g/dL — ABNORMAL LOW (ref 12.0–15.0)

## 2021-10-21 MED ORDER — TRAMADOL HCL 50 MG PO TABS
50.0000 mg | ORAL_TABLET | Freq: Four times a day (QID) | ORAL | Status: DC
  Administered 2021-10-21 – 2021-10-23 (×7): 50 mg via ORAL
  Filled 2021-10-21 (×8): qty 1

## 2021-10-21 MED ORDER — SODIUM CHLORIDE 0.9 % IV SOLN
1000.0000 mg | Freq: Once | INTRAVENOUS | Status: AC
  Administered 2021-10-21: 1000 mg via INTRAVENOUS
  Filled 2021-10-21: qty 20

## 2021-10-21 MED ORDER — METHOCARBAMOL 1000 MG/10ML IJ SOLN: 500.0000 mg | Freq: Four times a day (QID) | INTRAVENOUS | Status: DC | PRN

## 2021-10-21 MED ORDER — HYDROMORPHONE HCL 1 MG/ML IJ SOLN: 0.5000 mg | INTRAMUSCULAR | Status: DC | PRN

## 2021-10-21 MED ORDER — SODIUM CHLORIDE 0.9 % IV SOLN
25.0000 mg | Freq: Once | INTRAVENOUS | Status: AC
  Administered 2021-10-21: 25 mg via INTRAVENOUS
  Filled 2021-10-21: qty 0.5

## 2021-10-21 MED ORDER — OXYCODONE HCL 5 MG PO TABS
5.0000 mg | ORAL_TABLET | ORAL | Status: DC | PRN
  Administered 2021-10-21 (×2): 5 mg via ORAL
  Filled 2021-10-21 (×2): qty 1

## 2021-10-21 MED ORDER — METHOCARBAMOL 500 MG PO TABS: 500.0000 mg | ORAL_TABLET | Freq: Four times a day (QID) | ORAL | Status: DC | PRN

## 2021-10-21 MED ORDER — METHOCARBAMOL 500 MG PO TABS
500.0000 mg | ORAL_TABLET | Freq: Four times a day (QID) | ORAL | Status: DC | PRN
  Administered 2021-10-21: 500 mg via ORAL
  Filled 2021-10-21: qty 1

## 2021-10-21 NOTE — Progress Notes (Signed)
[facility] notified of urinary retention issues. Foley cath ordered.

## 2021-10-21 NOTE — Plan of Care (Signed)
  Problem: Clinical Measurements: Goal: Respiratory complications will improve Outcome: Progressing   Problem: Clinical Measurements: Goal: Cardiovascular complication will be avoided Outcome: Progressing   Problem: Elimination: Goal: Will not experience complications related to bowel motility Outcome: Progressing   Problem: Safety: Goal: Ability to remain free from injury will improve Outcome: Progressing   

## 2021-10-21 NOTE — Progress Notes (Signed)
[facility] Progress Note  SUBJECTIVE:   Interval history: Jamie Hodge was seen and evaluated today at bedside. She was resting comfortably with her son and spouse at bedside. She was eating cornbread during my evaluation with no issue. She denied abdominal pain and nausea. She has not had a recent bowel movement. She has been having ankle pain that is quite severe. Hgb trend noted today to 7.5 from 8.6 yesterday.  Past Medical History:  Diagnosis Date   Coronary artery disease    CVA (cerebral vascular accident) ([place])    Herpes zoster 2012   HERPES ZOSTER 08/13/2010   Qualifier: Diagnosis of  By: Koleen Nimrod MD, Jeffrey     Stroke [facility]) 09/25/2019   TIA (transient ischemic attack) 09/23/2019   Vertigo    Past Surgical History:  Procedure Laterality Date   CARDIAC CATHETERIZATION     CORONARY ARTERY BYPASS GRAFT N/A 11/29/2019   Procedure: CORONARY ARTERY BYPASS GRAFTING (CABG) x 6 ON PUMP. USING LEFT INTERNAL MAMMARY ARTERY, LEFT RADIAL ARTERY AND RIGHT GREATER SAPHENOUS VEIN.;  Surgeon: Wonda Olds, MD;  Location: [place];  Service: Open Heart Surgery;  Laterality: N/A;   ENDOVEIN HARVEST OF GREATER SAPHENOUS VEIN Right 11/29/2019   Procedure: ENDOVEIN HARVEST OF GREATER SAPHENOUS VEIN;  Surgeon: Wonda Olds, MD;  Location: [place];  Service: Open Heart Surgery;  Laterality: Right;   LEFT HEART CATH AND CORONARY ANGIOGRAPHY N/A 11/23/2019   Procedure: LEFT HEART CATH AND CORONARY ANGIOGRAPHY;  Surgeon: Jettie Booze, MD;  Location: [place] [facility];  Service: Cardiovascular;  Laterality: N/A;   RADIAL ARTERY HARVEST Left 11/29/2019   Procedure: RADIAL ARTERY HARVEST;  Surgeon: Wonda Olds, MD;  Location: [place];  Service: Open Heart Surgery;  Laterality: Left;   RHINOPLASTY     TEE WITHOUT CARDIOVERSION N/A 11/29/2019   Procedure: TRANSESOPHAGEAL ECHOCARDIOGRAM (TEE);  Surgeon: Wonda Olds, MD;  Location: [place];  Service: Open Heart Surgery;   Laterality: N/A;   Current Facility-Administered Medications  Medication Dose Route Frequency Provider Last Rate Last Admin   acetaminophen (TYLENOL) tablet 650 mg  650 mg Oral Q6H PRN Kristopher Oppenheim, DO   650 mg at 10/21/21 1420   Or   acetaminophen (TYLENOL) suppository 650 mg  650 mg Rectal Q6H PRN Kristopher Oppenheim, DO       amitriptyline (ELAVIL) tablet 10 mg  10 mg Oral QHS Kristopher Oppenheim, DO   10 mg at 10/20/21 2123   atorvastatin (LIPITOR) tablet 80 mg  80 mg Oral Daily Kristopher Oppenheim, DO   80 mg at 10/21/21 1328   Chlorhexidine Gluconate Cloth 2 % PADS 6 each  6 each Topical Q0600 Dwyane Dee, MD   6 each at 10/21/21 0615   gabapentin (NEURONTIN) capsule 900 mg  900 mg Oral QHS Kristopher Oppenheim, DO   900 mg at 10/20/21 2124   HYDROmorphone (DILAUDID) injection 0.5 mg  0.5 mg Intravenous Q2H PRN Dwyane Dee, MD       Or   oxyCODONE (Oxy IR/ROXICODONE) immediate release tablet 5-10 mg  5-10 mg Oral Q4H PRN Dwyane Dee, MD   5 mg at 10/21/21 1710   methocarbamol (ROBAXIN) 500 mg in dextrose 5 % 50 mL IVPB  500 mg Intravenous Q6H PRN Ellington, Abby K, RPH       Or   methocarbamol (ROBAXIN) tablet 500 mg  500 mg Oral Q6H PRN Dorena Bodo K, RPH   500 mg at 10/21/21 1421   mupirocin ointment (  BACTROBAN) 2 % 1 Application  1 Application Nasal BID Dwyane Dee, MD   1 Application at 23/55/73 1014   ondansetron (ZOFRAN) tablet 4 mg  4 mg Oral Q6H PRN Kristopher Oppenheim, DO   4 mg at 10/20/21 0801   Or   ondansetron (ZOFRAN) injection 4 mg  4 mg Intravenous Q6H PRN Kristopher Oppenheim, DO       pantoprozole (PROTONIX) 80 mg /NS 100 mL infusion  8 mg/hr Intravenous Continuous Kristopher Oppenheim, DO 10 mL/hr at 10/21/21 1317 8 mg/hr at 10/21/21 1317   sertraline (ZOLOFT) tablet 50 mg  50 mg Oral Daily Kristopher Oppenheim, DO   50 mg at 10/21/21 1329   traMADol (ULTRAM) tablet 50 mg  50 mg Oral Q6H Gawne, Meghan M, PA-C   50 mg at 10/21/21 1710   Allergies as of 10/20/2021 - Review Complete 10/20/2021  Allergen Reaction Noted   Mango  flavor Anaphylaxis 09/24/2019   Contrast media [iodinated contrast media] Other (See Comments) 09/24/2019   Review of Systems:  Review of Systems  Respiratory:  Negative for shortness of breath.   Cardiovascular:  Negative for chest pain.  Gastrointestinal:  Negative for abdominal pain, nausea and vomiting.    OBJECTIVE:   Temp:  [98.2 F (36.8 C)-99.2 F (37.3 C)] 98.6 F (37 C) (10/01 1802) Pulse Rate:  [82-96] 96 (10/01 1802) Resp:  [15-18] 16 (10/01 1802) BP: (99-129)/(45-61) 116/61 (10/01 1802) SpO2:  [85 %-100 %] 95 % (10/01 1802) Weight:  [84.3 kg] 84.3 kg (10/01 0500) Last BM Date : 10/18/21 Physical Exam Constitutional:      General: She is not in acute distress.    Appearance: She is not ill-appearing, toxic-appearing or diaphoretic.  Cardiovascular:     Rate and Rhythm: Normal rate and regular rhythm.  Pulmonary:     Effort: No respiratory distress.     Breath sounds: Normal breath sounds.  Abdominal:     General: Bowel sounds are normal. There is no distension.     Palpations: Abdomen is soft.     Tenderness: There is no abdominal tenderness. There is no guarding.  Neurological:     Mental Status: She is alert.     Labs: Recent Labs    10/20/21 0359 10/20/21 1953 10/21/21 0412  WBC 14.7*  --  11.4*  HGB 8.6* 8.2* 7.5*  HCT 26.9* 25.7* 23.8*  PLT 276  --  259   BMET Recent Labs    10/20/21 0359 10/21/21 0412  NA 136 137  K 3.7 3.9  CL 103 106  CO2 24 26  GLUCOSE 127* 118*  BUN 18 12  CREATININE 0.72 0.85  CALCIUM 9.2 8.2*   LFT Recent Labs    10/20/21 0359  PROT 6.8  ALBUMIN 3.9  AST 24  ALT 19  ALKPHOS 75  BILITOT 0.6   PT/INR No results for input(s): "LABPROT", "INR" in the last 72 hours. Diagnostic imaging: DG Ankle Complete Right  Result Date: 10/20/2021 CLINICAL DATA:  Fall, medial ankle pain, swelling, bruising EXAM: RIGHT ANKLE - COMPLETE 3 VIEW COMPARISON:  None Available. FINDINGS: Fracture through the medial malleolus  which is minimally displaced. Overlying soft tissue swelling. The ankle mortise is preserved. IMPRESSION: Minimally displaced fracture through the medial malleolus. Electronically Signed   By: Merilyn Baba M.D.   On: 10/20/2021 03:24    IMPRESSION: Coffee ground emesis  Normocytic anemia Coronary artery disease on dual antiplatelet medication prior to admission              -  Now being held Leukocytosis Closed fracture right medial malleolus  PLAN: - No further coffee ground emesis x 5 days, though persistent anemia - Continue IV PPI therapy  - Continue to hold clopidogrel and aspirin - Plan for EGD on 10/2 to investigate further - NPO at midnight for procedure   LOS: 0 days   Danton Clap, [facility]

## 2021-10-21 NOTE — Progress Notes (Signed)
Progress Note    Jamie Hodge   LPF:[id number]  DOB: 10/18/1940  DOA: 10/20/2021     0 PCP: Ridge, Brent  Initial CC: Hematemesis, fall, dizziness  Hospital Course: 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  X-ray on admission showed a minimally displaced medial malleolus fracture of the right. She follows with cardiology outpatient and has remained on DAPT per cardiac surgery recommendations. Due to her possible GIB and DAPT therapy she is being evaluated by GI and cardiology as well as by orthopedic surgery for attention to the right ankle fracture.   Interval History:  No events overnight.  No further hematemesis.  About to work with PT when seen this morning.  Biggest complaint is ongoing pain in her right ankle.  Assessment and Plan: * Hematemesis - concern is for possible UGIB given DAPT therapy; denies NSAID use (aside from asa), tobacco, etoh.  - holding DAPT for now - GI following, appreciate assistance; awaiting washout of Plavix and tentative plan for EGD on 10/22/2021 -Monitoring for any further GI bleeding symptoms and will trend hemoglobin as needed - no further bleeding reported; likely lag of Hgb, down to 7.5 g/dL on 10/1 and iron studies also low  IDA (iron deficiency anemia) - Hemoglobin 8.6 g/dL on arrival.  Appears to have a baseline around 10 to 11 g/dL - See hematemesis -Iron studies also low.  Replete with INFeD on 10/21/2021  Closed right ankle fracture - Minimally displaced right malleolus fracture - Stirrup splint currently in place - Follow-up orthopedic recommendations. -NWB to RLE per ortho  CAD (coronary artery disease) - BP meds on hold in setting of presyncope described on admission - DAPT  on hold; patient has been seen by cardiology.  Tentative plan would be for resuming Plavix when safely able - continue lipitor  Hypertension Hold blood pressure medications due to symptomatic dizziness.  S/P CABG x 6 Stable.  Hyperlipidemia Continue Lipitor.  80 mg daily.  Postherpetic neuralgia Continue Neurontin 300 mg at bedtime along with amitriptyline 10 mg at bedtime   Old records reviewed in assessment of this patient  Antimicrobials:   DVT prophylaxis:  SCDs Start: 10/20/21 0720   Code Status:   Code Status: Full Code  Mobility Assessment (last 72 hours)     Mobility Assessment     Row Name 10/21/21 1351 10/21/21 0749 10/20/21 1910 10/20/21 0721 10/20/21 0720   Does patient have an order for bedrest or is patient medically unstable -- Yes- Bedfast (Level 1) - Complete Yes- Bedfast (Level 1) - Complete Yes- Bedfast (Level 1) - Complete Yes- Bedfast (Level 1) - Complete   What is the highest level of mobility based on the progressive mobility assessment? Level 3 (Stands with assist) - Balance while standing  and cannot march in place -- -- -- --            Barriers to discharge: none Disposition Plan:  Home Status is: Obs  Objective: Blood pressure (!) 118/51, pulse 89, temperature 98.8 F (37.1 C), temperature source Oral, resp. rate 16, height '5\' 4"'$  (1.626 m), weight 84.3 kg, SpO2 94 %.  Examination:  Physical Exam Constitutional:      Appearance: Normal appearance.  HENT:  Head: Normocephalic and atraumatic.     Mouth/Throat:     Mouth: Mucous membranes are moist.  Eyes:     Extraocular Movements: Extraocular movements intact.  Cardiovascular:     Rate and Rhythm: Normal rate and regular rhythm.  Pulmonary:     Effort: Pulmonary effort is normal.     Breath sounds: Normal breath sounds.  Abdominal:     General: Bowel sounds are normal. There is no distension.     Palpations: Abdomen is soft.     Tenderness: There is no abdominal  tenderness.  Musculoskeletal:     Cervical back: Normal range of motion and neck supple.     Comments: RLE in stirrup splint  Neurological:     Mental Status: She is alert.     Comments: Sensation intact in right toes; able to wiggle toes  Psychiatric:        Mood and Affect: Mood normal.        Behavior: Behavior normal.      Consultants:  GI Cardiology Orthopedic surgery  Procedures:    Data Reviewed: Results for orders placed or performed during the hospital encounter of 10/20/21 (from the past 24 hour(s))  Surgical pcr screen     Status: Abnormal   Collection Time: 10/20/21  5:29 PM   Specimen: Nasal Mucosa; Nasal Swab  Result Value Ref Range   MRSA, PCR NEGATIVE NEGATIVE   Staphylococcus aureus POSITIVE (A) NEGATIVE  Hemoglobin and hematocrit, blood     Status: Abnormal   Collection Time: 10/20/21  7:53 PM  Result Value Ref Range   Hemoglobin 8.2 (L) 12.0 - 15.0 g/dL   HCT 25.7 (L) 36.0 - 46.0 %  Iron and TIBC     Status: Abnormal   Collection Time: 10/21/21  4:12 AM  Result Value Ref Range   Iron 14 (L) 28 - 170 ug/dL   TIBC 294 250 - 450 ug/dL   Saturation Ratios 5 (L) 10.4 - 31.8 %   UIBC 280 ug/dL  Basic metabolic panel     Status: Abnormal   Collection Time: 10/21/21  4:12 AM  Result Value Ref Range   Sodium 137 135 - 145 mmol/L   Potassium 3.9 3.5 - 5.1 mmol/L   Chloride 106 98 - 111 mmol/L   CO2 26 22 - 32 mmol/L   Glucose, Bld 118 (H) 70 - 99 mg/dL   BUN 12 8 - 23 mg/dL   Creatinine, Ser 0.85 0.44 - 1.00 mg/dL   Calcium 8.2 (L) 8.9 - 10.3 mg/dL   GFR, Estimated >60 >60 mL/min   Anion gap 5 5 - 15  CBC with Differential/Platelet     Status: Abnormal   Collection Time: 10/21/21  4:12 AM  Result Value Ref Range   WBC 11.4 (H) 4.0 - 10.5 K/uL   RBC 2.46 (L) 3.87 - 5.11 MIL/uL   Hemoglobin 7.5 (L) 12.0 - 15.0 g/dL   HCT 23.8 (L) 36.0 - 46.0 %   MCV 96.7 80.0 - 100.0 fL   MCH 30.5 26.0 - 34.0 pg   MCHC 31.5 30.0 - 36.0 g/dL   RDW 14.6 11.5 - 15.5  %   Platelets 259 150 - 400 K/uL   nRBC 0.2 0.0 - 0.2 %   Neutrophils Relative % 67 %   Neutro Abs 7.7 1.7 - 7.7 K/uL   Lymphocytes Relative 21 %   Lymphs Abs 2.4 0.7 - 4.0 K/uL   Monocytes Relative 9 %   Monocytes Absolute  1.0 0.1 - 1.0 K/uL   Eosinophils Relative 2 %   Eosinophils Absolute 0.2 0.0 - 0.5 K/uL   Basophils Relative 0 %   Basophils Absolute 0.1 0.0 - 0.1 K/uL   Immature Granulocytes 1 %   Abs Immature Granulocytes 0.06 0.00 - 0.07 K/uL  Magnesium     Status: None   Collection Time: 10/21/21  4:12 AM  Result Value Ref Range   Magnesium 1.8 1.7 - 2.4 mg/dL    I have Reviewed nursing notes, Vitals, and Lab results since pt's last encounter. Pertinent lab results : see above I have ordered test including BMP, CBC, Mg I have reviewed the last note from staff over past 24 hours I have discussed pt's care plan and test results with nursing staff, case manager   LOS: 0 days   Dwyane Dee, MD [facility] 10/21/2021, 3:32 PM

## 2021-10-21 NOTE — H&P (View-Only) (Signed)
[facility] Progress Note  SUBJECTIVE:   Interval history: Jamie Hodge was seen and evaluated today at bedside. She was resting comfortably with her son and spouse at bedside. She was eating cornbread during my evaluation with no issue. She denied abdominal pain and nausea. She has not had a recent bowel movement. She has been having ankle pain that is quite severe. Hgb trend noted today to 7.5 from 8.6 yesterday.  Past Medical History:  Diagnosis Date   Coronary artery disease    CVA (cerebral vascular accident) ([place])    Herpes zoster 2012   HERPES ZOSTER 08/13/2010   Qualifier: Diagnosis of  By: Koleen Nimrod MD, Jeffrey     Stroke [facility]) 09/25/2019   TIA (transient ischemic attack) 09/23/2019   Vertigo    Past Surgical History:  Procedure Laterality Date   CARDIAC CATHETERIZATION     CORONARY ARTERY BYPASS GRAFT N/A 11/29/2019   Procedure: CORONARY ARTERY BYPASS GRAFTING (CABG) x 6 ON PUMP. USING LEFT INTERNAL MAMMARY ARTERY, LEFT RADIAL ARTERY AND RIGHT GREATER SAPHENOUS VEIN.;  Surgeon: Wonda Olds, MD;  Location: [place];  Service: Open Heart Surgery;  Laterality: N/A;   ENDOVEIN HARVEST OF GREATER SAPHENOUS VEIN Right 11/29/2019   Procedure: ENDOVEIN HARVEST OF GREATER SAPHENOUS VEIN;  Surgeon: Wonda Olds, MD;  Location: [place];  Service: Open Heart Surgery;  Laterality: Right;   LEFT HEART CATH AND CORONARY ANGIOGRAPHY N/A 11/23/2019   Procedure: LEFT HEART CATH AND CORONARY ANGIOGRAPHY;  Surgeon: Jettie Booze, MD;  Location: [place] [facility];  Service: Cardiovascular;  Laterality: N/A;   RADIAL ARTERY HARVEST Left 11/29/2019   Procedure: RADIAL ARTERY HARVEST;  Surgeon: Wonda Olds, MD;  Location: [place];  Service: Open Heart Surgery;  Laterality: Left;   RHINOPLASTY     TEE WITHOUT CARDIOVERSION N/A 11/29/2019   Procedure: TRANSESOPHAGEAL ECHOCARDIOGRAM (TEE);  Surgeon: Wonda Olds, MD;  Location: [place];  Service: Open Heart Surgery;   Laterality: N/A;   Current Facility-Administered Medications  Medication Dose Route Frequency Provider Last Rate Last Admin   acetaminophen (TYLENOL) tablet 650 mg  650 mg Oral Q6H PRN Kristopher Oppenheim, DO   650 mg at 10/21/21 1420   Or   acetaminophen (TYLENOL) suppository 650 mg  650 mg Rectal Q6H PRN Kristopher Oppenheim, DO       amitriptyline (ELAVIL) tablet 10 mg  10 mg Oral QHS Kristopher Oppenheim, DO   10 mg at 10/20/21 2123   atorvastatin (LIPITOR) tablet 80 mg  80 mg Oral Daily Kristopher Oppenheim, DO   80 mg at 10/21/21 1328   Chlorhexidine Gluconate Cloth 2 % PADS 6 each  6 each Topical Q0600 Dwyane Dee, MD   6 each at 10/21/21 0615   gabapentin (NEURONTIN) capsule 900 mg  900 mg Oral QHS Kristopher Oppenheim, DO   900 mg at 10/20/21 2124   HYDROmorphone (DILAUDID) injection 0.5 mg  0.5 mg Intravenous Q2H PRN Dwyane Dee, MD       Or   oxyCODONE (Oxy IR/ROXICODONE) immediate release tablet 5-10 mg  5-10 mg Oral Q4H PRN Dwyane Dee, MD   5 mg at 10/21/21 1710   methocarbamol (ROBAXIN) 500 mg in dextrose 5 % 50 mL IVPB  500 mg Intravenous Q6H PRN Ellington, Abby K, RPH       Or   methocarbamol (ROBAXIN) tablet 500 mg  500 mg Oral Q6H PRN Dorena Bodo K, RPH   500 mg at 10/21/21 1421   mupirocin ointment (  BACTROBAN) 2 % 1 Application  1 Application Nasal BID Dwyane Dee, MD   1 Application at 41/66/06 1014   ondansetron (ZOFRAN) tablet 4 mg  4 mg Oral Q6H PRN Kristopher Oppenheim, DO   4 mg at 10/20/21 0801   Or   ondansetron (ZOFRAN) injection 4 mg  4 mg Intravenous Q6H PRN Kristopher Oppenheim, DO       pantoprozole (PROTONIX) 80 mg /NS 100 mL infusion  8 mg/hr Intravenous Continuous Kristopher Oppenheim, DO 10 mL/hr at 10/21/21 1317 8 mg/hr at 10/21/21 1317   sertraline (ZOLOFT) tablet 50 mg  50 mg Oral Daily Kristopher Oppenheim, DO   50 mg at 10/21/21 1329   traMADol (ULTRAM) tablet 50 mg  50 mg Oral Q6H Gawne, Meghan M, PA-C   50 mg at 10/21/21 1710   Allergies as of 10/20/2021 - Review Complete 10/20/2021  Allergen Reaction Noted   Mango  flavor Anaphylaxis 09/24/2019   Contrast media [iodinated contrast media] Other (See Comments) 09/24/2019   Review of Systems:  Review of Systems  Respiratory:  Negative for shortness of breath.   Cardiovascular:  Negative for chest pain.  Gastrointestinal:  Negative for abdominal pain, nausea and vomiting.    OBJECTIVE:   Temp:  [98.2 F (36.8 C)-99.2 F (37.3 C)] 98.6 F (37 C) (10/01 1802) Pulse Rate:  [82-96] 96 (10/01 1802) Resp:  [15-18] 16 (10/01 1802) BP: (99-129)/(45-61) 116/61 (10/01 1802) SpO2:  [85 %-100 %] 95 % (10/01 1802) Weight:  [84.3 kg] 84.3 kg (10/01 0500) Last BM Date : 10/18/21 Physical Exam Constitutional:      General: She is not in acute distress.    Appearance: She is not ill-appearing, toxic-appearing or diaphoretic.  Cardiovascular:     Rate and Rhythm: Normal rate and regular rhythm.  Pulmonary:     Effort: No respiratory distress.     Breath sounds: Normal breath sounds.  Abdominal:     General: Bowel sounds are normal. There is no distension.     Palpations: Abdomen is soft.     Tenderness: There is no abdominal tenderness. There is no guarding.  Neurological:     Mental Status: She is alert.     Labs: Recent Labs    10/20/21 0359 10/20/21 1953 10/21/21 0412  WBC 14.7*  --  11.4*  HGB 8.6* 8.2* 7.5*  HCT 26.9* 25.7* 23.8*  PLT 276  --  259   BMET Recent Labs    10/20/21 0359 10/21/21 0412  NA 136 137  K 3.7 3.9  CL 103 106  CO2 24 26  GLUCOSE 127* 118*  BUN 18 12  CREATININE 0.72 0.85  CALCIUM 9.2 8.2*   LFT Recent Labs    10/20/21 0359  PROT 6.8  ALBUMIN 3.9  AST 24  ALT 19  ALKPHOS 75  BILITOT 0.6   PT/INR No results for input(s): "LABPROT", "INR" in the last 72 hours. Diagnostic imaging: DG Ankle Complete Right  Result Date: 10/20/2021 CLINICAL DATA:  Fall, medial ankle pain, swelling, bruising EXAM: RIGHT ANKLE - COMPLETE 3 VIEW COMPARISON:  None Available. FINDINGS: Fracture through the medial malleolus  which is minimally displaced. Overlying soft tissue swelling. The ankle mortise is preserved. IMPRESSION: Minimally displaced fracture through the medial malleolus. Electronically Signed   By: Merilyn Baba M.D.   On: 10/20/2021 03:24    IMPRESSION: Coffee ground emesis  Normocytic anemia Coronary artery disease on dual antiplatelet medication prior to admission              -  Now being held Leukocytosis Closed fracture right medial malleolus  PLAN: - No further coffee ground emesis x 5 days, though persistent anemia - Continue IV PPI therapy  - Continue to hold clopidogrel and aspirin - Plan for EGD on 10/2 to investigate further - NPO at midnight for procedure   LOS: 0 days   Danton Clap, [facility]

## 2021-10-21 NOTE — Evaluation (Signed)
Physical Therapy Evaluation Patient Details Name: Jamie Hodge MRN: [id number] DOB: 1940-11-24 Today's Date: 10/21/2021  History of Present Illness  81 y.o. female who went to take a shower on 10/20/21, became lightheaded and sat down/fell in the shower which resulted in R ankle medial malleolus fx;pt  also with anemia/coffee ground emesis-GI consulted. ortho consulted reagrding R ankle and recommended no surgical intervention/immobilization in splint for now.  past medical history significant for coronary artery disease s/p CABG in 2021, CVA in 2021, vertigo, HTN  Clinical Impression  Pt admitted with above diagnosis.  Pt is independent at her baseline; very limited mobility today d/t pain and fatigue. Will hopefully progress to be able to return home with HHPT, if not may need SNF. Pt states husband cannot physically assist much, says her son can come and stay with her however question this scenario. May be able to mobilize in w/c at home; overall decr insight into her current deficits.  Pt currently with functional limitations due to the deficits listed below (see PT Problem List). Pt will benefit from skilled PT to increase their independence and safety with mobility to allow discharge to the venue listed below.          Recommendations for follow up therapy are one component of a multi-disciplinary discharge planning process, led by the attending physician.  Recommendations may be updated based on patient status, additional functional criteria and insurance authorization.  Follow Up Recommendations Home health PT (unless slow progress--SNF)      Assistance Recommended at Discharge Frequent or constant Supervision/Assistance  Patient can return home with the following  A little help with walking and/or transfers;Assistance with cooking/housework;Assist for transportation;Help with stairs or ramp for entrance;A lot of help with bathing/dressing/bathroom    Equipment Recommendations  None recommended by PT  Recommendations for Other Services       Functional Status Assessment Patient has had a recent decline in their functional status and demonstrates the ability to make significant improvements in function in a reasonable and predictable amount of time.     Precautions / Restrictions Precautions Precautions: Fall Required Braces or Orthoses: Splint/Cast Splint/Cast: R ankle splint Restrictions RLE Weight Bearing: Non weight bearing      Mobility  Bed Mobility Overal bed mobility: Needs Assistance Bed Mobility: Supine to Sit, Sit to Supine     Supine to sit: Min assist, Mod assist Sit to supine: Min assist, Mod assist   General bed mobility comments: incr time and effort, cues for self assist. assist to progress  RLE off of bed and lift bil LEs on to bed    Transfers Overall transfer level: Needs assistance Equipment used: Rolling walker (2 wheels) Transfers: Sit to/from Stand Sit to Stand: Min assist, Mod assist           General transfer comment: cues for hand placement, assist to rise, steady and transition to RW    Ambulation/Gait               General Gait Details: declines amb attempt or transfer at this time  Stairs            Wheelchair Mobility    Modified Rankin (Stroke Patients Only)       Balance Overall balance assessment: Needs assistance Sitting-balance support: Feet supported Sitting balance-Leahy Scale: Fair     Standing balance support: Reliant on assistive device for balance, Bilateral upper extremity supported Standing balance-Leahy Scale: Poor  Pertinent Vitals/Pain Pain Assessment Pain Assessment: Faces Faces Pain Scale: Hurts whole lot Pain Location: right ankle, incr with dependent positioning Pain Descriptors / Indicators: Sore Pain Intervention(s): Limited activity within patient's tolerance, Monitored during session, Repositioned, Premedicated  before session    [place] expects to be discharged to:: Private residence Living Arrangements: Spouse/significant other Available Help at Discharge: Family;Available 24 hours/day Type of Home: House Home Access: Stairs to enter Entrance Stairs-Rails: None Entrance Stairs-Number of Steps: 2   Home Layout: Able to live on main level with bedroom/bathroom;Laundry or work area in [place]: Conservation officer, nature (2 wheels);BSC/3in1;Cane - single point;Shower seat Additional Comments: local son in [place], pt reports he can stay with her if she needs physical assist--(son is not present to confirm this assumption); pt does state that she could mobilize in her home in a w/c if needed.    Prior Function Prior Level of Function : Independent/Modified Independent             Mobility Comments: independent       Hand Dominance        Extremity/Trunk Assessment   Upper Extremity Assessment Upper Extremity Assessment: Defer to OT evaluation    Lower Extremity Assessment Lower Extremity Assessment: RLE deficits/detail RLE Deficits / Details: Knee ROM grossly WFL, pt reluctant to flex/extend knee d/t ankle pain RLE: Unable to fully assess due to immobilization       Communication   Communication: No difficulties  Cognition Arousal/Alertness: Awake/alert Behavior During Therapy: WFL for tasks assessed/performed Overall Cognitive Status: Within Functional Limits for tasks assessed  Ox3, decr insight into current deficits                                          General Comments      Exercises     Assessment/Plan    PT Assessment Patient needs continued PT services  PT Problem List Decreased strength;Decreased mobility;Decreased activity tolerance;Decreased balance;Decreased knowledge of use of DME;Pain       PT Treatment Interventions DME instruction;Therapeutic exercise;Gait training;Functional mobility  training;Therapeutic activities;Patient/family education    PT Goals (Current goals can be found in the Care Plan section)  Acute Rehab PT Goals Patient Stated Goal: home PT Goal Formulation: With patient Time For Goal Achievement: 11/04/21 Potential to Achieve Goals: Good    Frequency Min 3X/week     Co-evaluation               AM-PAC PT "6 Clicks" Mobility  Outcome Measure Help needed turning from your back to your side while in a flat bed without using bedrails?: A Little Help needed moving from lying on your back to sitting on the side of a flat bed without using bedrails?: A Little Help needed moving to and from a bed to a chair (including a wheelchair)?: A Lot Help needed standing up from a chair using your arms (e.g., wheelchair or bedside chair)?: A Lot Help needed to walk in hospital room?: Total Help needed climbing 3-5 steps with a railing? : Total 6 Click Score: 12    End of Session Equipment Utilized During Treatment: Gait belt Activity Tolerance: Patient limited by fatigue;Patient limited by pain Patient left: in bed;with call bell/phone within reach;with family/visitor present;with bed alarm set Nurse Communication: Mobility status PT Visit Diagnosis: Other abnormalities of gait and mobility (R26.89);Difficulty in walking, not elsewhere classified (R26.2)  Time: 1231-1257 PT Time Calculation (min) (ACUTE ONLY): 26 min   Charges:   PT Evaluation $PT Eval Low Complexity: 1 Low PT Treatments $Therapeutic Activity: 8-22 mins        Baxter Flattery, PT  Acute Rehab Dept [facility]) [phone]  [facility] Weekend Pager [facility] only)  [phone]  10/21/2021   [facility] 10/21/2021, 1:53 PM

## 2021-10-22 ENCOUNTER — Inpatient Hospital Stay (HOSPITAL_COMMUNITY): Payer: Medicare Other | Admitting: Certified Registered"

## 2021-10-22 ENCOUNTER — Encounter (HOSPITAL_COMMUNITY): Payer: Self-pay | Admitting: Internal Medicine

## 2021-10-22 ENCOUNTER — Encounter (HOSPITAL_COMMUNITY)
Admission: EM | Disposition: A | Discharge status: Home Health | Payer: Self-pay | Source: Home / Self Care | Attending: Internal Medicine

## 2021-10-22 DIAGNOSIS — K449 Diaphragmatic hernia without obstruction or gangrene: Secondary | ICD-10-CM

## 2021-10-22 DIAGNOSIS — R338 Other retention of urine: Secondary | ICD-10-CM | POA: Diagnosis not present

## 2021-10-22 DIAGNOSIS — S82891A Other fracture of right lower leg, initial encounter for closed fracture: Secondary | ICD-10-CM | POA: Diagnosis not present

## 2021-10-22 DIAGNOSIS — Z8673 Personal history of transient ischemic attack (TIA), and cerebral infarction without residual deficits: Secondary | ICD-10-CM

## 2021-10-22 DIAGNOSIS — K259 Gastric ulcer, unspecified as acute or chronic, without hemorrhage or perforation: Secondary | ICD-10-CM

## 2021-10-22 DIAGNOSIS — D509 Iron deficiency anemia, unspecified: Secondary | ICD-10-CM | POA: Diagnosis not present

## 2021-10-22 DIAGNOSIS — K920 Hematemesis: Secondary | ICD-10-CM | POA: Diagnosis not present

## 2021-10-22 DIAGNOSIS — K222 Esophageal obstruction: Secondary | ICD-10-CM

## 2021-10-22 HISTORY — PX: HEMOSTASIS CLIP PLACEMENT: SHX6857

## 2021-10-22 HISTORY — PX: ESOPHAGOGASTRODUODENOSCOPY: SHX5428

## 2021-10-22 HISTORY — PX: BIOPSY: SHX5522

## 2021-10-22 LAB — CBC WITH DIFFERENTIAL/PLATELET
Abs Immature Granulocytes: 0.04 10*3/uL (ref 0.00–0.07)
Basophils Absolute: 0 10*3/uL (ref 0.0–0.1)
Basophils Relative: 0 %
Eosinophils Absolute: 0.2 10*3/uL (ref 0.0–0.5)
Eosinophils Relative: 2 %
HCT: 21.3 % — ABNORMAL LOW (ref 36.0–46.0)
Hemoglobin: 6.7 g/dL — CL (ref 12.0–15.0)
Immature Granulocytes: 0 %
Lymphocytes Relative: 14 %
Lymphs Abs: 1.7 10*3/uL (ref 0.7–4.0)
MCH: 30.5 pg (ref 26.0–34.0)
MCHC: 31.5 g/dL (ref 30.0–36.0)
MCV: 96.8 fL (ref 80.0–100.0)
Monocytes Absolute: 1.2 10*3/uL — ABNORMAL HIGH (ref 0.1–1.0)
Monocytes Relative: 10 %
Neutro Abs: 8.9 10*3/uL — ABNORMAL HIGH (ref 1.7–7.7)
Neutrophils Relative %: 74 %
Platelets: 250 10*3/uL (ref 150–400)
RBC: 2.2 MIL/uL — ABNORMAL LOW (ref 3.87–5.11)
RDW: 14.7 % (ref 11.5–15.5)
WBC: 12.1 10*3/uL — ABNORMAL HIGH (ref 4.0–10.5)
nRBC: 0 % (ref 0.0–0.2)

## 2021-10-22 LAB — MAGNESIUM: Magnesium: 2 mg/dL (ref 1.7–2.4)

## 2021-10-22 LAB — BASIC METABOLIC PANEL
Anion gap: 5 (ref 5–15)
BUN: 8 mg/dL (ref 8–23)
CO2: 26 mmol/L (ref 22–32)
Calcium: 8.5 mg/dL — ABNORMAL LOW (ref 8.9–10.3)
Chloride: 105 mmol/L (ref 98–111)
Creatinine, Ser: 0.71 mg/dL (ref 0.44–1.00)
GFR, Estimated: >60 mL/min (ref >60)
Glucose, Bld: 126 mg/dL — ABNORMAL HIGH (ref 70–99)
Potassium: 3.1 mmol/L — ABNORMAL LOW (ref 3.5–5.1)
Sodium: 136 mmol/L (ref 135–145)

## 2021-10-22 LAB — PREPARE RBC (CROSSMATCH)

## 2021-10-22 LAB — HEMOGLOBIN AND HEMATOCRIT, BLOOD
HCT: 26.2 % — ABNORMAL LOW (ref 36.0–46.0)
Hemoglobin: 8.4 g/dL — ABNORMAL LOW (ref 12.0–15.0)

## 2021-10-22 SURGERY — EGD (ESOPHAGOGASTRODUODENOSCOPY)
Anesthesia: Monitor Anesthesia Care

## 2021-10-22 MED ORDER — PROPOFOL 500 MG/50ML IV EMUL
INTRAVENOUS | Status: AC
  Filled 2021-10-22: qty 150

## 2021-10-22 MED ORDER — PROPOFOL 500 MG/50ML IV EMUL
INTRAVENOUS | Status: DC | PRN
  Administered 2021-10-22: 125 ug/kg/min via INTRAVENOUS

## 2021-10-22 MED ORDER — TRIAMCINOLONE ACETONIDE 55 MCG/ACT NA AERO: 2.0000 | INHALATION_SPRAY | Freq: Every day | NASAL | Status: DC | PRN

## 2021-10-22 MED ORDER — LIDOCAINE 2% (20 MG/ML) 5 ML SYRINGE
INTRAMUSCULAR | Status: DC | PRN
  Administered 2021-10-22: 40 mg via INTRAVENOUS

## 2021-10-22 MED ORDER — PROSIGHT PO TABS
1.0000 | ORAL_TABLET | Freq: Every day | ORAL | Status: DC
  Administered 2021-10-23: 1 via ORAL
  Filled 2021-10-22: qty 1

## 2021-10-22 MED ORDER — PANTOPRAZOLE SODIUM 40 MG PO TBEC
40.0000 mg | DELAYED_RELEASE_TABLET | Freq: Two times a day (BID) | ORAL | Status: DC
  Administered 2021-10-22 – 2021-10-23 (×2): 40 mg via ORAL
  Filled 2021-10-22 (×2): qty 1

## 2021-10-22 MED ORDER — LACTATED RINGERS IV SOLN: INTRAVENOUS | Status: DC | PRN

## 2021-10-22 MED ORDER — POTASSIUM CHLORIDE 10 MEQ/100ML IV SOLN
10.0000 meq | INTRAVENOUS | Status: AC
  Administered 2021-10-22 (×4): 10 meq via INTRAVENOUS
  Filled 2021-10-22 (×4): qty 100

## 2021-10-22 MED ORDER — PRESERVISION AREDS 2+MULTI VIT PO CAPS: ORAL_CAPSULE | Freq: Every day | ORAL | Status: DC

## 2021-10-22 MED ORDER — SODIUM CHLORIDE 0.9% IV SOLUTION: Freq: Once | INTRAVENOUS | Status: AC

## 2021-10-22 MED ORDER — PROPOFOL 10 MG/ML IV BOLUS
INTRAVENOUS | Status: DC | PRN
  Administered 2021-10-22 (×2): 20 mg via INTRAVENOUS

## 2021-10-22 MED ORDER — PROPOFOL 1000 MG/100ML IV EMUL
INTRAVENOUS | Status: AC
  Filled 2021-10-22: qty 100

## 2021-10-22 NOTE — Progress Notes (Signed)
Progress Note    Jamie Hodge   ZOX:[id number]  DOB: [birth year]  DOA: 10/20/2021     1 PCP: [facility], [facility]  Initial CC: Hematemesis, fall, dizziness  Hospital Course: Jamie Hodge is a 81 yo female with PMH CAD s/p CABG 21', CVA, HTN, postherpetic neuralgia, HLD who presented to the ER after 2 episodes of hematemesis over the past 4 days prior to admission; with associated presyncopal event causing her to fall and injure her right ankle.  X-ray on admission showed a minimally displaced medial malleolus fracture of the right. She follows with cardiology outpatient and has remained on DAPT per cardiac surgery recommendations. Due to her possible GIB and DAPT therapy she is being evaluated by GI and cardiology as well as by orthopedic surgery for attention to the right ankle fracture.   Interval History:  No events overnight.  Underwent EGD today.  Seen in room after EGD.  Husband present bedside.  Pain in right ankle has improved some.  Assessment and Plan: * Hematemesis - concern is for possible UGIB given DAPT therapy; denies NSAID use (aside from asa), tobacco, etoh.  - holding DAPT for now - GI following, appreciate assistance - underwent EGD on 10/22/2021 (Schatzki ring at GE junction, hiatal hernia, clean-based cratered gastric ulcer) - Hgb down to 6.7 g/dL on 10/2, received 1 unit PRBC -Okay for Plavix to be resumed on 10/23/2021 per GI - Protonix twice daily recommended for 2 months per GI  IDA (iron deficiency anemia) - Hemoglobin 8.6 g/dL on arrival.  Appears to have a baseline around 10 to 11 g/dL - See hematemesis -Iron studies also low.  Replete with INFeD on 10/21/2021  Closed right ankle fracture - Minimally displaced right malleolus fracture - Stirrup splint currently in place; continue x 1 week then possible cast vs CAM boot per ortho -NWB to RLE per ortho -Outpatient follow-up with orthopedic surgery in 1 week for new x-rays  CAD  (coronary artery disease) - BP meds on hold in setting of presyncope described on admission - DAPT on hold; patient has been seen by cardiology.  Tentative plan would be for resuming Plavix when safely able (10/3 per GI) - continue lipitor  Hypertension Hold blood pressure medications due to symptomatic dizziness.  S/P CABG x 6 Stable.  Hyperlipidemia Continue Lipitor.  80 mg daily.  Postherpetic neuralgia Continue Neurontin 300 mg at bedtime along with amitriptyline 10 mg at bedtime   Old records reviewed in assessment of this patient  Antimicrobials:   DVT prophylaxis:  SCDs Start: 10/20/21 0720   Code Status:   Code Status: Full Code  Mobility Assessment (last 72 hours)     Mobility Assessment     Row Name 10/22/21 1100 10/22/21 0748 10/21/21 1935 10/21/21 1351 10/21/21 0749   Does patient have an order for bedrest or is patient medically unstable -- Yes- Bedfast (Level 1) - Complete Yes- Bedfast (Level 1) - Complete -- Yes- Bedfast (Level 1) - Complete   What is the highest level of mobility based on the progressive mobility assessment? Level 3 (Stands with assist) - Balance while standing  and cannot march in place Level 3 (Stands with assist) - Balance while standing  and cannot march in place -- Level 3 (Stands with assist) - Balance while standing  and cannot march in place --    [place] Name 10/20/21 1910 10/20/21 8119 10/20/21 0720       Does patient have an order for bedrest  or is patient medically unstable Yes- Bedfast (Level 1) - Complete Yes- Bedfast (Level 1) - Complete Yes- Bedfast (Level 1) - Complete              Barriers to discharge: none Disposition Plan:  Home Status is: Inpt  Objective: Blood pressure (!) 120/52, pulse (!) 101, temperature 99 F (37.2 C), temperature source Oral, resp. rate 16, height '5\' 4"'$  (1.626 m), weight 86.1 kg, SpO2 95 %.  Examination:  Physical Exam Constitutional:      Appearance: Normal appearance.  HENT:      Head: Normocephalic and atraumatic.     Mouth/Throat:     Mouth: Mucous membranes are moist.  Eyes:     Extraocular Movements: Extraocular movements intact.  Cardiovascular:     Rate and Rhythm: Normal rate and regular rhythm.  Pulmonary:     Effort: Pulmonary effort is normal.     Breath sounds: Normal breath sounds.  Abdominal:     General: Bowel sounds are normal. There is no distension.     Palpations: Abdomen is soft.     Tenderness: There is no abdominal tenderness.  Musculoskeletal:     Cervical back: Normal range of motion and neck supple.     Comments: RLE in stirrup splint  Neurological:     Mental Status: She is alert.     Comments: Sensation intact in right toes; able to wiggle toes  Psychiatric:        Mood and Affect: Mood normal.        Behavior: Behavior normal.      Consultants:  GI Cardiology Orthopedic surgery  Procedures:  EGD, 10/22/21  Data Reviewed: Results for orders placed or performed during the hospital encounter of 10/20/21 (from the past 24 hour(s))  Hemoglobin and hematocrit, blood     Status: Abnormal   Collection Time: 10/21/21  7:57 PM  Result Value Ref Range   Hemoglobin 7.2 (L) 12.0 - 15.0 g/dL   HCT 23.0 (L) 36.0 - 36.1 %  Basic metabolic panel     Status: Abnormal   Collection Time: 10/22/21  3:41 AM  Result Value Ref Range   Sodium 136 135 - 145 mmol/L   Potassium 3.1 (L) 3.5 - 5.1 mmol/L   Chloride 105 98 - 111 mmol/L   CO2 26 22 - 32 mmol/L   Glucose, Bld 126 (H) 70 - 99 mg/dL   BUN 8 8 - 23 mg/dL   Creatinine, Ser 0.71 0.44 - 1.00 mg/dL   Calcium 8.5 (L) 8.9 - 10.3 mg/dL   GFR, Estimated >60 >60 mL/min   Anion gap 5 5 - 15  CBC with Differential/Platelet     Status: Abnormal   Collection Time: 10/22/21  3:41 AM  Result Value Ref Range   WBC 12.1 (H) 4.0 - 10.5 K/uL   RBC 2.20 (L) 3.87 - 5.11 MIL/uL   Hemoglobin 6.7 (LL) 12.0 - 15.0 g/dL   HCT 21.3 (L) 36.0 - 46.0 %   MCV 96.8 80.0 - 100.0 fL   MCH 30.5 26.0 - 34.0  pg   MCHC 31.5 30.0 - 36.0 g/dL   RDW 14.7 11.5 - 15.5 %   Platelets 250 150 - 400 K/uL   nRBC 0.0 0.0 - 0.2 %   Neutrophils Relative % 74 %   Neutro Abs 8.9 (H) 1.7 - 7.7 K/uL   Lymphocytes Relative 14 %   Lymphs Abs 1.7 0.7 - 4.0 K/uL   Monocytes Relative 10 %  Monocytes Absolute 1.2 (H) 0.1 - 1.0 K/uL   Eosinophils Relative 2 %   Eosinophils Absolute 0.2 0.0 - 0.5 K/uL   Basophils Relative 0 %   Basophils Absolute 0.0 0.0 - 0.1 K/uL   Immature Granulocytes 0 %   Abs Immature Granulocytes 0.04 0.00 - 0.07 K/uL  Magnesium     Status: None   Collection Time: 10/22/21  3:41 AM  Result Value Ref Range   Magnesium 2.0 1.7 - 2.4 mg/dL  Prepare RBC (crossmatch)     Status: None   Collection Time: 10/22/21  4:38 AM  Result Value Ref Range   Order Confirmation      ORDER PROCESSED BY BLOOD BANK Performed at [facility] [address], [place], [place] [address]   Hemoglobin and hematocrit, blood     Status: Abnormal   Collection Time: 10/22/21 12:26 PM  Result Value Ref Range   Hemoglobin 8.4 (L) 12.0 - 15.0 g/dL   HCT 26.2 (L) 36.0 - 46.0 %    I have Reviewed nursing notes, Vitals, and Lab results since pt's last encounter. Pertinent lab results : see above I have ordered test including BMP, CBC, Mg I have reviewed the last note from staff over past 24 hours I have discussed pt's care plan and test results with nursing staff, case manager   LOS: 1 day   Dwyane Dee, MD [facility] 10/22/2021, 3:45 PM

## 2021-10-22 NOTE — Transfer of Care (Signed)
Immediate Anesthesia Transfer of Care Note  Patient: [name]  Procedure(s) Performed: ESOPHAGOGASTRODUODENOSCOPY (EGD) BIOPSY HEMOSTASIS CLIP PLACEMENT  Patient Location: PACU and Endoscopy Unit  Anesthesia Type:MAC  Level of Consciousness: awake and patient cooperative  Airway & Oxygen Therapy: Patient Spontanous Breathing and Patient connected to face mask oxygen  Post-op Assessment: Report given to RN and Post -op Vital signs reviewed and stable  Post vital signs: Reviewed and stable  Last Vitals:  Vitals Value Taken Time  BP 96/59 10/22/21 1418  Temp    Pulse 103 10/22/21 1418  Resp 21 10/22/21 1419  SpO2 100 % 10/22/21 1418  Vitals shown include unvalidated device data.  Last Pain:  Vitals:   10/22/21 1418  TempSrc: Temporal  PainSc:       Patients Stated Pain Goal: 3 (68/08/81 1031)  Complications: No notable events documented.

## 2021-10-22 NOTE — Plan of Care (Signed)
  Problem: Activity: Goal: Risk for activity intolerance will decrease Outcome: Progressing   Problem: Safety: Goal: Ability to remain free from injury will improve Outcome: Progressing   

## 2021-10-22 NOTE — Anesthesia Preprocedure Evaluation (Signed)
Anesthesia Evaluation  Patient identified by MRN, date of birth, ID band Patient awake    Reviewed: Allergy & Precautions, NPO status , Patient's Chart, lab work & pertinent test results  Airway Mallampati: II  TM Distance: >3 FB Neck ROM: Full    Dental  (+) Dental Advisory Given   Pulmonary neg pulmonary ROS,    breath sounds clear to auscultation       Cardiovascular hypertension, Pt. on medications and Pt. on home beta blockers + CAD and + CABG   Rhythm:Regular Rate:Normal     Neuro/Psych TIACVA    GI/Hepatic negative GI ROS, Neg liver ROS,   Endo/Other  negative endocrine ROS  Renal/GU negative Renal ROS     Musculoskeletal   Abdominal   Peds  Hematology  (+) Blood dyscrasia, anemia ,   Anesthesia Other Findings   Reproductive/Obstetrics                             Anesthesia Physical Anesthesia Plan  ASA: 3  Anesthesia Plan: MAC   Post-op Pain Management: Minimal or no pain anticipated   Induction:   PONV Risk Score and Plan: 2 and Propofol infusion and Ondansetron  Airway Management Planned: Natural Airway and Nasal Cannula  Additional Equipment:   Intra-op Plan:   Post-operative Plan:   Informed Consent: I have reviewed the patients History and Physical, chart, labs and discussed the procedure including the risks, benefits and alternatives for the proposed anesthesia with the patient or authorized representative who has indicated his/her understanding and acceptance.       Plan Discussed with:   Anesthesia Plan Comments:         Anesthesia Quick Evaluation

## 2021-10-22 NOTE — Evaluation (Signed)
Occupational Therapy Evaluation Patient Details Name: Jamie Hodge MRN: [id number] DOB: 11/29/1940 Today's Date: 10/22/2021   History of Present Illness 81 y.o. female who went to take a shower on 10/20/21, became lightheaded and sat down/fell in the shower which resulted in R ankle medial malleolus fx;pt  also with anemia/coffee ground emesis-GI consulted. ortho consulted reagrding R ankle and recommended no surgical intervention/immobilization in splint for now.  past medical history significant for coronary artery disease s/p CABG in 2021, CVA in 2021, vertigo, HTN   Clinical Impression   Pt admitted with the above diagnosis and has the deficits listed below. Pt would benefit from cont OT to increase independence with basic adls and adl transfers so she can d/c home with her husband. Husband unable to assist with heavy lifting but can assist pt with minimal assist. Pt currently has hgb of 6.7 and BP of 99/67 and did not feel well sitting on EOB therefore could not mobilize. Feel pt will mobilize well when feeling better and be able to d/c home with [facility] but will continue to assess.       Recommendations for follow up therapy are one component of a multi-disciplinary discharge planning process, led by the attending physician.  Recommendations may be updated based on patient status, additional functional criteria and insurance authorization.   Follow Up Recommendations  Home health OT    Assistance Recommended at Discharge Intermittent Supervision/Assistance  Patient can return home with the following A lot of help with walking and/or transfers;A lot of help with bathing/dressing/bathroom;Assistance with cooking/housework;Assist for transportation;Help with stairs or ramp for entrance    Functional Status Assessment  Patient has had a recent decline in their functional status and demonstrates the ability to make significant improvements in function in a reasonable and predictable amount  of time.  Equipment Recommendations  Other (comment) (tbd)    Recommendations for Other Services       Precautions / Restrictions Precautions Precautions: Fall Required Braces or Orthoses: Splint/Cast Splint/Cast: R ankle splint Splint/Cast - Date Prophylactic Dressing Applied (if applicable): 19/14/78 Restrictions Weight Bearing Restrictions: Yes RLE Weight Bearing: Non weight bearing      Mobility Bed Mobility Overal bed mobility: Needs Assistance Bed Mobility: Supine to Sit, Sit to Supine     Supine to sit: Mod assist Sit to supine: Mod assist   General bed mobility comments: incr time and effort, cues for self assist. assist to progress  RLE off of bed and lift bil LEs on to bed    Transfers Overall transfer level: Needs assistance                        Balance Overall balance assessment: Needs assistance Sitting-balance support: Feet supported Sitting balance-Leahy Scale: Fair         Standing balance comment: declined standing due to not feeling well                           ADL either performed or assessed with clinical judgement   ADL Overall ADL's : Needs assistance/impaired Eating/Feeding: NPO   Grooming: Wash/dry hands;Wash/dry face;Oral care;Set up;Sitting Grooming Details (indicate cue type and reason): Pt mildly dizzy sitting.  BP 99/67.  Hgb 6.7. Returned pt to supine in bed Upper Body Bathing: Set up;Sitting   Lower Body Bathing: Moderate assistance;Sit to/from stand;Cueing for compensatory techniques Lower Body Bathing Details (indicate cue type and reason): assist to stand Upper Body  Dressing : Set up;Sitting   Lower Body Dressing: Maximal assistance;Sit to/from stand;Cueing for compensatory techniques     Toilet Transfer Details (indicate cue type and reason): pt declined. Feeling dizzy and low hgb. BP 99/67 Toileting- Clothing Manipulation and Hygiene: Maximal assistance;Bed level       Functional mobility  during ADLs: Moderate assistance General ADL Comments: Pt limited with LE adls due to not feeling well and declining standing. Will encourage pt to stand after procedure. Pt with hgb 6.7 and BP 99/67 therefore deferred standing for today.     Vision Baseline Vision/History: 0 No visual deficits Ability to See in Adequate Light: 0 Adequate Patient Visual Report: No change from baseline Vision Assessment?: No apparent visual deficits     Perception     Praxis      Pertinent Vitals/Pain Pain Assessment Pain Assessment: 0-10 Pain Score: 8  Pain Location: right ankle, incr with dependent positioning Pain Descriptors / Indicators: Sore Pain Intervention(s): Limited activity within patient's tolerance, Monitored during session, Patient requesting pain meds-RN notified, RN gave pain meds during session, Repositioned     Hand Dominance Left   Extremity/Trunk Assessment Upper Extremity Assessment Upper Extremity Assessment: Overall WFL for tasks assessed   Lower Extremity Assessment Lower Extremity Assessment: Defer to PT evaluation   Cervical / Trunk Assessment Cervical / Trunk Assessment: Normal   Communication Communication Communication: No difficulties   Cognition Arousal/Alertness: Awake/alert Behavior During Therapy: WFL for tasks assessed/performed Overall Cognitive Status: Impaired/Different from baseline Area of Impairment: Following commands, Awareness, Problem solving                       Following Commands: Follows one step commands consistently, Follows multi-step commands inconsistently   Awareness: Emergent Problem Solving: Slow processing General Comments: Pt appeared slightly "off" this am. Pt states she is not hard of hearing but appeared hard of hearing this am or just not following my commands. Husband has noticed the same. Pt could not recall her phone number and required cues for address.     General Comments  Pt should mobilize well but  resistant today.  Pt hgb 6.7 and low BP.    Exercises     Shoulder Instructions      Home Living Family/patient expects to be discharged to:: Private residence Living Arrangements: Spouse/significant other Available Help at Discharge: Family;Available 24 hours/day Type of Home: House Home Access: Stairs to enter [employer] of Steps: 2 Entrance Stairs-Rails: None Home Layout: Able to live on main level with bedroom/bathroom;Laundry or work area in basement     [employer] Shower/Tub: Occupational psychologist: [place]: Conservation officer, nature (2 wheels);BSC/3in1;Cane - single point;Shower seat   Additional Comments: local son in [place], pt reports he can stay with her if she needs physical assist--(son is not present to confirm this assumption); pt does state that she could mobilize in her home in a w/c if needed.      Prior Functioning/Environment Prior Level of Function : Independent/Modified Independent             Mobility Comments: independent ADLs Comments: independent        OT Problem List: Decreased activity tolerance;Impaired balance (sitting and/or standing);Decreased cognition;Decreased safety awareness;Decreased knowledge of use of DME or AE;Decreased knowledge of precautions;Pain      OT Treatment/Interventions: Self-care/ADL training;Therapeutic activities;DME and/or AE instruction;Balance training    OT Goals(Current goals can be found in the care plan section)  Acute Rehab OT Goals Patient Stated Goal: to feel better OT Goal Formulation: With patient/family Time For Goal Achievement: 11/05/21 Potential to Achieve Goals: Good ADL Goals Pt Will Perform Grooming: with supervision;standing Pt Will Perform Lower Body Bathing: with supervision;sit to/from stand Pt Will Perform Lower Body Dressing: with supervision;sit to/from stand Pt Will Transfer to Toilet: with supervision;bedside commode;regular height  toilet;ambulating Pt Will Perform Toileting - Clothing Manipulation and hygiene: with supervision;sit to/from stand Pt Will Perform Tub/Shower Transfer: Shower transfer;with min assist;ambulating;shower seat;rolling walker;3 in 1  OT Frequency: Min 2X/week    Co-evaluation              AM-PAC OT "6 Clicks" Daily Activity     Outcome Measure Help from another person eating meals?: None Help from another person taking care of personal grooming?: None Help from another person toileting, which includes using toliet, bedpan, or urinal?: A Lot Help from another person bathing (including washing, rinsing, drying)?: A Lot Help from another person to put on and taking off regular upper body clothing?: A Little Help from another person to put on and taking off regular lower body clothing?: A Lot 6 Click Score: 17   End of Session Equipment Utilized During Treatment: Rolling walker (2 wheels);Oxygen Nurse Communication: Mobility status  Activity Tolerance: Patient limited by lethargy;Patient limited by pain Patient left: in bed;with call bell/phone within reach;with bed alarm set;with family/visitor present  OT Visit Diagnosis: Unsteadiness on feet (R26.81)                Time: 5885-0277 OT Time Calculation (min): 23 min Charges:  OT General Charges $OT Visit: 1 Visit OT Evaluation $OT Eval Moderate Complexity: 1 Mod  Glenford Peers 10/22/2021, 11:54 AM

## 2021-10-22 NOTE — Op Note (Signed)
[facility] Patient Name: Jamie Hodge Procedure Date: 10/22/2021 MRN: [id number] Attending MD: [name] , MD Date of Birth: 10-Nov-1940 CSN: [id number] Age: 81 Admit Type: Inpatient Procedure:                Upper GI endoscopy Indications:              Coffee-ground emesis Providers:                [name], MD, Dulcy Fanny, William Dalton,                            Technician Referring MD:             [facility] Medicines:                Monitored Anesthesia Care Complications:            No immediate complications. Estimated blood loss:                            Minimal. Estimated Blood Loss:     Estimated blood loss was minimal. Procedure:                Pre-Anesthesia Assessment:                           - Prior to the procedure, a History and Physical                            was performed, and patient medications and                            allergies were reviewed. The patient's tolerance of                            previous anesthesia was also reviewed. The risks                            and benefits of the procedure and the sedation                            options and risks were discussed with the patient.                            All questions were answered, and informed consent                            was obtained. Prior Anticoagulants: The patient has                            taken Plavix (clopidogrel), last dose was 3 days                            prior to procedure. ASA Grade Assessment: III - A                            patient  with severe systemic disease. After                            reviewing the risks and benefits, the patient was                            deemed in satisfactory condition to undergo the                            procedure.                           After obtaining informed consent, the endoscope was                            passed under direct vision. Throughout the                             procedure, the patient's blood pressure, pulse, and                            oxygen saturations were monitored continuously. The                            [id number]-H190 ([id number] Olympus endoscope was introduced                            through the mouth, and advanced to the second part                            of duodenum. The upper GI endoscopy was                            accomplished without difficulty. The patient                            tolerated the procedure well. Scope In: Scope Out: Findings:      The upper third of the esophagus, middle third of the esophagus and       lower third of the esophagus were normal.      A widely patent Schatzki ring was found at the gastroesophageal junction.      A 4 cm hiatal hernia was present.      One non-bleeding cratered gastric ulcer with a clean ulcer base (Forrest       Class III) was found in the gastric antrum. The lesion was 5 mm in       largest dimension. Biopsies were taken with a cold forceps for       Helicobacter pylori testing. For hemostasis, one hemostatic clip was       successfully placed (MR conditional). There was no bleeding during, or       at the end, of the procedure.      The cardia and gastric fundus were normal on retroflexion.      The duodenal bulb and second portion of the duodenum were normal.      Localized moderate mucosal changes characterized  by nodularity were       found in the first portion of the duodenum. Biopsies were taken with a       cold forceps for histology. Impression:               - Normal upper third of esophagus, middle third of                            esophagus and lower third of esophagus.                           - Widely patent Schatzki ring.                           - 4 cm hiatal hernia.                           - Non-bleeding gastric ulcer with a clean ulcer                            base (Forrest Class III). Biopsied. Clip (MR                             conditional) was placed.                           - Normal duodenal bulb and second portion of the                            duodenum.                           - Mucosal changes in the duodenum. Biopsied. Moderate Sedation:      Patient did not receive moderate sedation for this procedure, but       instead received monitored anesthesia care. Recommendation:           - Resume regular diet.                           - Continue present medications.                           - Await pathology results.                           - Resume Plavix (clopidogrel) at prior dose                            tomorrow.                           - Use Protonix (pantoprazole) 40 mg PO BID for 2                            months. Procedure Code(s):        --- Professional ---  43255, 59, Esophagogastroduodenoscopy, flexible,                            transoral; with control of bleeding, any method                           43239, Esophagogastroduodenoscopy, flexible,                            transoral; with biopsy, single or multiple Diagnosis Code(s):        --- Professional ---                           K22.2, Esophageal obstruction                           K44.9, Diaphragmatic hernia without obstruction or                            gangrene                           K25.9, Gastric ulcer, unspecified as acute or                            chronic, without hemorrhage or perforation                           K31.89, Other diseases of stomach and duodenum                           K92.0, Hematemesis CPT copyright 2019 American Medical Association. All rights reserved. The codes documented in this report are preliminary and upon coder review may  be revised to meet current compliance requirements. [name], MD 10/22/2021 2:20:55 PM This report has been signed electronically. Number of Addenda: 0

## 2021-10-22 NOTE — Anesthesia Procedure Notes (Signed)
Procedure Name: MAC Date/Time: 10/22/2021 1:58 PM  Performed by: Eben Burow, CRNAPre-anesthesia Checklist: Patient identified, Emergency Drugs available, Suction available, Patient being monitored and Timeout performed Oxygen Delivery Method: Simple face mask Placement Confirmation: positive ETCO2

## 2021-10-22 NOTE — Interval H&P Note (Signed)
History and Physical Interval Note: 80/female with coffee ground emesis on ASA and plavix, anemia, for an EGD with propofol.  10/22/2021 1:55 PM  Jamie Hodge  has presented today for EGD, with the diagnosis of Coffee ground emesis.  The various methods of treatment have been discussed with the patient and family. After consideration of risks, benefits and other options for treatment, the patient has consented to  Procedure(s): ESOPHAGOGASTRODUODENOSCOPY (EGD) (N/A) as a surgical intervention.  The patient's history has been reviewed, patient examined, no change in status, stable for surgery.  I have reviewed the patient's chart and labs.  Questions were answered to the patient's satisfaction.     [name]

## 2021-10-23 DIAGNOSIS — K922 Gastrointestinal hemorrhage, unspecified: Secondary | ICD-10-CM

## 2021-10-23 LAB — CBC WITH DIFFERENTIAL/PLATELET
Abs Immature Granulocytes: 0.05 10*3/uL (ref 0.00–0.07)
Basophils Absolute: 0 10*3/uL (ref 0.0–0.1)
Basophils Relative: 0 %
Eosinophils Absolute: 0.4 10*3/uL (ref 0.0–0.5)
Eosinophils Relative: 4 %
HCT: 26.1 % — ABNORMAL LOW (ref 36.0–46.0)
Hemoglobin: 8.2 g/dL — ABNORMAL LOW (ref 12.0–15.0)
Immature Granulocytes: 1 %
Lymphocytes Relative: 15 %
Lymphs Abs: 1.5 10*3/uL (ref 0.7–4.0)
MCH: 30 pg (ref 26.0–34.0)
MCHC: 31.4 g/dL (ref 30.0–36.0)
MCV: 95.6 fL (ref 80.0–100.0)
Monocytes Absolute: 0.9 10*3/uL (ref 0.1–1.0)
Monocytes Relative: 9 %
Neutro Abs: 7.3 10*3/uL (ref 1.7–7.7)
Neutrophils Relative %: 71 %
Platelets: 259 10*3/uL (ref 150–400)
RBC: 2.73 MIL/uL — ABNORMAL LOW (ref 3.87–5.11)
RDW: 15.2 % (ref 11.5–15.5)
WBC: 10.3 10*3/uL (ref 4.0–10.5)
nRBC: 0 % (ref 0.0–0.2)

## 2021-10-23 LAB — BASIC METABOLIC PANEL
Anion gap: 6 (ref 5–15)
BUN: 7 mg/dL — ABNORMAL LOW (ref 8–23)
CO2: 26 mmol/L (ref 22–32)
Calcium: 8.5 mg/dL — ABNORMAL LOW (ref 8.9–10.3)
Chloride: 105 mmol/L (ref 98–111)
Creatinine, Ser: 0.6 mg/dL (ref 0.44–1.00)
GFR, Estimated: >60 mL/min (ref >60)
Glucose, Bld: 109 mg/dL — ABNORMAL HIGH (ref 70–99)
Potassium: 3.6 mmol/L (ref 3.5–5.1)
Sodium: 137 mmol/L (ref 135–145)

## 2021-10-23 LAB — SURGICAL PATHOLOGY

## 2021-10-23 LAB — MAGNESIUM: Magnesium: 1.7 mg/dL (ref 1.7–2.4)

## 2021-10-23 MED ORDER — OXYCODONE HCL 5 MG PO TABS: 5.0000 mg | ORAL_TABLET | ORAL | 0 refills | Status: DC | PRN

## 2021-10-23 MED ORDER — CLOPIDOGREL BISULFATE 75 MG PO TABS
75.0000 mg | ORAL_TABLET | Freq: Every day | ORAL | Status: DC
  Administered 2021-10-23: 75 mg via ORAL
  Filled 2021-10-23: qty 1

## 2021-10-23 MED ORDER — PANTOPRAZOLE SODIUM 40 MG PO TBEC: 40.0000 mg | DELAYED_RELEASE_TABLET | Freq: Two times a day (BID) | ORAL | 2 refills | Status: AC

## 2021-10-23 NOTE — Progress Notes (Signed)
Physical Therapy Treatment Patient Details Name: Jamie Hodge MRN: [id number] DOB: [birth year] Today's Date: 10/23/2021   History of Present Illness 80 y.o. female who went to take a shower on 10/20/21, became lightheaded and sat down/fell in the shower which resulted in R ankle medial malleolus fx;pt  also with anemia/coffee ground emesis-GI consulted. ortho consulted reagrding R ankle and recommended no surgical intervention/immobilization in splint for now.  past medical history significant for coronary artery disease s/p CABG in 2021, CVA in 2021, vertigo, HTN    PT Comments    Pt requiring at least mod-max assist for transfers at this time.  Pt with limited mobility due to nausea and dizziness however also presents with cognitive deficits.  BP upon sitting: 128/62 mmHg HR 101 and SPO2 94% room air.  BP upon return to supine: 118/52 mmHg.  Updated d/c recommendations for SNF as pt requiring physical assist assist and has limited mobility.    Recommendations for follow up therapy are one component of a multi-disciplinary discharge planning process, led by the attending physician.  Recommendations may be updated based on patient status, additional functional criteria and insurance authorization.  Follow Up Recommendations  Skilled nursing-short term rehab (<3 hours/day) Can patient physically be transported by private vehicle: No   Assistance Recommended at Discharge Frequent or constant Supervision/Assistance  Patient can return home with the following Assistance with cooking/housework;Assist for transportation;Help with stairs or ramp for entrance;A lot of help with bathing/dressing/bathroom;A lot of help with walking and/or transfers   Equipment Recommendations  Wheelchair (measurements PT) (if home, w/c with elevating leg rests)    Recommendations for Other Services       Precautions / Restrictions Precautions Precautions: Fall Required Braces or Orthoses:  Splint/Cast Splint/Cast: R ankle splint Restrictions Weight Bearing Restrictions: Yes RLE Weight Bearing: Non weight bearing     Mobility  Bed Mobility Overal bed mobility: Needs Assistance Bed Mobility: Supine to Sit, Sit to Supine     Supine to sit: Min assist Sit to supine: Mod assist   General bed mobility comments: incr time and effort, cues for self assist. assist for lift bil LEs on to bed    Transfers Overall transfer level: Needs assistance Equipment used: Rolling walker (2 wheels) Transfers: Sit to/from Stand Sit to Stand: Max assist, +2 safety/equipment, +2 physical assistance           General transfer comment: multimodal cues for positioning and technique, pt reporting dizziness with sitting and standing as well as nausea, requiring increased cues for technique and safety as well as maintaining NWB    Ambulation/Gait               General Gait Details: declined further mobility due to nausea and dizziness   Stairs             Wheelchair Mobility    Modified Rankin (Stroke Patients Only)       Balance Overall balance assessment: History of Falls         Standing balance support: Bilateral upper extremity supported, During functional activity, Reliant on assistive device for balance Standing balance-Leahy Scale: Zero                              Cognition Arousal/Alertness: Awake/alert Behavior During Therapy: WFL for tasks assessed/performed Overall Cognitive Status: Impaired/Different from baseline Area of Impairment: Following commands, Safety/judgement, Problem solving, Awareness  Following Commands: Follows one step commands consistently, Follows multi-step commands inconsistently Safety/Judgement: Decreased awareness of safety, Decreased awareness of deficits Awareness: Emergent Problem Solving: Slow processing, Requires verbal cues General Comments: pt able orientated x3 however  present with cognitive impairments, uncertain of baseline        Exercises      General Comments        Pertinent Vitals/Pain Pain Assessment Pain Assessment: Faces Faces Pain Scale: Hurts little more Pain Location: right ankle, incr with dependent positioning Pain Descriptors / Indicators: Sore, Throbbing Pain Intervention(s): Monitored during session, Repositioned    Home Living                          Prior Function            PT Goals (current goals can now be found in the care plan section) Progress towards PT goals: Progressing toward goals    Frequency    Min 3X/week      PT Plan Current plan remains appropriate;Discharge plan needs to be updated    Co-evaluation              AM-PAC PT "6 Clicks" Mobility   Outcome Measure  Help needed turning from your back to your side while in a flat bed without using bedrails?: A Little Help needed moving from lying on your back to sitting on the side of a flat bed without using bedrails?: A Lot Help needed moving to and from a bed to a chair (including a wheelchair)?: A Lot Help needed standing up from a chair using your arms (e.g., wheelchair or bedside chair)?: A Lot Help needed to walk in hospital room?: Total Help needed climbing 3-5 steps with a railing? : Total 6 Click Score: 11    End of Session Equipment Utilized During Treatment: Gait belt Activity Tolerance: Patient limited by fatigue;Patient limited by pain Patient left: in bed;with call bell/phone within reach;with bed alarm set Nurse Communication: Mobility status PT Visit Diagnosis: Other abnormalities of gait and mobility (R26.89);Difficulty in walking, not elsewhere classified (R26.2)     Time: 1010-1026 PT Time Calculation (min) (ACUTE ONLY): 16 min  Charges:  $Therapeutic Activity: 8-22 mins                    Jannette Spanner PT, DPT Physical Therapist Acute Rehabilitation Services Preferred contact method: Secure Chat Weekend  Pager Only: [phone] Office: [place] 10/23/2021, 11:16 AM

## 2021-10-23 NOTE — Anesthesia Postprocedure Evaluation (Signed)
Anesthesia Post Note  Patient: Jamie Hodge  Procedure(s) Performed: ESOPHAGOGASTRODUODENOSCOPY (EGD) BIOPSY HEMOSTASIS CLIP PLACEMENT     Patient location during evaluation: PACU Anesthesia Type: MAC Level of consciousness: awake and alert Pain management: pain level controlled Vital Signs Assessment: post-procedure vital signs reviewed and stable Respiratory status: spontaneous breathing, nonlabored ventilation, respiratory function stable and patient connected to nasal cannula oxygen Cardiovascular status: stable and blood pressure returned to baseline Postop Assessment: no apparent nausea or vomiting Anesthetic complications: no   No notable events documented.  Last Vitals:  Vitals:   10/22/21 2214 10/23/21 0551  BP: (!) 116/50 (!) 119/48  Pulse: 91 83  Resp: 16 16  Temp: 37.3 C 36.9 C  SpO2: 100% 95%    Last Pain:  Vitals:   10/23/21 0900  TempSrc:   PainSc: 0-No pain   Pain Goal: Patients Stated Pain Goal: 2 (10/23/21 0339)                 [name]

## 2021-10-23 NOTE — Plan of Care (Signed)
  Problem: Activity: Goal: Risk for activity intolerance will decrease Outcome: Progressing   Problem: Pain Managment: Goal: General experience of comfort will improve Outcome: Progressing   Problem: Safety: Goal: Ability to remain free from injury will improve Outcome: Progressing   

## 2021-10-23 NOTE — Progress Notes (Signed)
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  X-ray on admission showed a minimally displaced medial malleolus fracture of the right. She follows with cardiology outpatient and has remained on DAPT per cardiac surgery recommendations. Due to her possible GIB and DAPT therapy she is being evaluated by GI and cardiology as well as by orthopedic surgery for attention to the right ankle fracture.   Interval History:  No events overnight.  Patient still having difficulty mobilizing especially with nonweightbearing to the right lower extremity.  PT now recommending SNF.  Assessment and Plan: * Hematemesis - concern is for possible UGIB given DAPT therapy; denies NSAID use (aside from asa), tobacco, etoh.  - holding DAPT for now - GI following, appreciate assistance - underwent EGD on 10/22/2021 (Schatzki ring at GE junction, hiatal hernia, clean-based cratered gastric ulcer) - Hgb down to 6.7 g/dL on 10/2, received 1 unit PRBC -Okay for Plavix to be resumed on 10/23/2021 per GI - Protonix twice daily recommended for 2 months per GI  IDA (iron deficiency anemia) - Hemoglobin 8.6 g/dL on arrival.  Appears to have a baseline around 10 to 11 g/dL - See hematemesis -Iron studies also low.  Replete with INFeD on 10/21/2021  Closed right ankle fracture - Minimally displaced right malleolus fracture - Stirrup splint currently in place; continue x 1 week then possible cast vs CAM boot per ortho -NWB to RLE per ortho -Outpatient follow-up with orthopedic surgery in 1 week for new  x-rays - PT now recommending SNF due to her limited mobility   CAD (coronary artery disease) - BP meds on hold in setting of presyncope described on admission - DAPT on hold; patient has been seen by cardiology.  Tentative plan would be for resuming Plavix when safely able (10/3 per GI) - continue lipitor  Hypertension Hold blood pressure medications due to symptomatic dizziness.  S/P CABG x 6 Stable.  Hyperlipidemia Continue Lipitor.  80 mg daily.  Postherpetic neuralgia Continue Neurontin 300 mg at bedtime along with amitriptyline 10 mg at bedtime   Old records reviewed in assessment of this patient  Antimicrobials:   DVT prophylaxis:  SCDs Start: 10/20/21 0720   Code Status:   Code Status: Full Code  Mobility Assessment (last 72 hours)     Mobility Assessment     Row Name 10/23/21 1114 10/22/21 2325 10/22/21 1100 10/22/21 0748 10/21/21 1935   Does patient have an order for bedrest or is patient medically unstable -- No - Continue assessment -- Yes- Bedfast (Level 1) - Complete Yes- Bedfast (Level 1) - Complete   What is the highest level of mobility based on the progressive mobility assessment? Level 3 (Stands with assist) - Balance while standing  and cannot march in place Level 3 (Stands with assist) - Balance while standing  and cannot march in place Level 3 (Stands with assist) - Balance while standing  and cannot march in place Level 3 (Stands with assist) - Balance while standing  and cannot march in place --   Is the  above level different from baseline mobility prior to current illness? -- Yes - Recommend PT order -- -- --    Row Name 10/21/21 1351 10/21/21 0749 10/20/21 1910       Does patient have an order for bedrest or is patient medically unstable -- Yes- Bedfast (Level 1) - Complete Yes- Bedfast (Level 1) - Complete     What is the highest level of mobility based on the progressive mobility assessment? Level 3 (Stands with assist) - Balance while standing   and cannot march in place -- --              Barriers to discharge: none Disposition Plan:  Home Status is: Inpt  Objective: Blood pressure (!) 119/48, pulse 83, temperature 98.5 F (36.9 C), temperature source Oral, resp. rate 16, height '5\' 4"'$  (1.626 m), weight 88.2 kg, SpO2 95 %.  Examination:  Physical Exam Constitutional:      Appearance: Normal appearance.  HENT:     Head: Normocephalic and atraumatic.     Mouth/Throat:     Mouth: Mucous membranes are moist.  Eyes:     Extraocular Movements: Extraocular movements intact.  Cardiovascular:     Rate and Rhythm: Normal rate and regular rhythm.  Pulmonary:     Effort: Pulmonary effort is normal.     Breath sounds: Normal breath sounds.  Abdominal:     General: Bowel sounds are normal. There is no distension.     Palpations: Abdomen is soft.     Tenderness: There is no abdominal tenderness.  Musculoskeletal:     Cervical back: Normal range of motion and neck supple.     Comments: RLE in stirrup splint  Neurological:     Mental Status: She is alert.     Comments: Sensation intact in right toes; able to wiggle toes  Psychiatric:        Mood and Affect: Mood normal.        Behavior: Behavior normal.      Consultants:  GI Cardiology Orthopedic surgery  Procedures:  EGD, 10/22/21  Data Reviewed: Results for orders placed or performed during the hospital encounter of 10/20/21 (from the past 24 hour(s))  Basic metabolic panel     Status: Abnormal   Collection Time: 10/23/21  3:38 AM  Result Value Ref Range   Sodium 137 135 - 145 mmol/L   Potassium 3.6 3.5 - 5.1 mmol/L   Chloride 105 98 - 111 mmol/L   CO2 26 22 - 32 mmol/L   Glucose, Bld 109 (H) 70 - 99 mg/dL   BUN 7 (L) 8 - 23 mg/dL   Creatinine, Ser 0.60 0.44 - 1.00 mg/dL   Calcium 8.5 (L) 8.9 - 10.3 mg/dL   GFR, Estimated >60 >60 mL/min   Anion gap 6 5 - 15  Magnesium     Status: None   Collection Time: 10/23/21  3:38 AM  Result Value Ref Range    Magnesium 1.7 1.7 - 2.4 mg/dL  CBC with Differential/Platelet     Status: Abnormal   Collection Time: 10/23/21  7:58 AM  Result Value Ref Range   WBC 10.3 4.0 - 10.5 K/uL   RBC 2.73 (L) 3.87 - 5.11 MIL/uL   Hemoglobin 8.2 (L) 12.0 - 15.0 g/dL   HCT 26.1 (L) 36.0 - 46.0 %   MCV 95.6 80.0 - 100.0 fL   MCH 30.0 26.0 - 34.0 pg   MCHC 31.4 30.0 - 36.0 g/dL   RDW 15.2 11.5 -  15.5 %   Platelets 259 150 - 400 K/uL   nRBC 0.0 0.0 - 0.2 %   Neutrophils Relative % 71 %   Neutro Abs 7.3 1.7 - 7.7 K/uL   Lymphocytes Relative 15 %   Lymphs Abs 1.5 0.7 - 4.0 K/uL   Monocytes Relative 9 %   Monocytes Absolute 0.9 0.1 - 1.0 K/uL   Eosinophils Relative 4 %   Eosinophils Absolute 0.4 0.0 - 0.5 K/uL   Basophils Relative 0 %   Basophils Absolute 0.0 0.0 - 0.1 K/uL   Immature Granulocytes 1 %   Abs Immature Granulocytes 0.05 0.00 - 0.07 K/uL    I have Reviewed nursing notes, Vitals, and Lab results since pt's last encounter. Pertinent lab results : see above I have ordered test including BMP, CBC, Mg I have reviewed the last note from staff over past 24 hours I have discussed pt's care plan and test results with nursing staff, case manager   LOS: 2 days   Dwyane Dee, MD [facility] 10/23/2021, 1:58 PM

## 2021-10-23 NOTE — Discharge Summary (Signed)
Physician Discharge Summary   SECRET KRISTENSEN URK:[id number] DOB: [birth year] DOA: 10/20/2021  PCP: [facility] date: 10/20/2021 Discharge date: 10/23/2021 Barriers to discharge: none  Admitted From: Home Disposition:  Home with [facility] Discharging physician: Dwyane Dee, MD  Recommendations for Outpatient Follow-up:  Follow-up with orthopedic surgery  Home Health: PT Equipment/Devices:   Discharge Condition: stable CODE STATUS: Full Diet recommendation:  Diet Orders (From admission, onward)     Start     Ordered   10/23/21 0000  Diet general        10/23/21 1528   10/22/21 1504  Diet regular Room service appropriate? Yes; Fluid consistency: Thin  Diet effective now       Question Answer Comment  Room service appropriate? Yes   Fluid consistency: Thin      10/22/21 1503            Hospital Course: Ms. 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  X-ray on admission showed a minimally displaced medial malleolus fracture of the right. She follows with cardiology outpatient and has remained on DAPT per cardiac surgery recommendations. Due to her possible GIB and DAPT therapy she is being evaluated by GI and cardiology as well as by orthopedic surgery for attention to the right ankle fracture.   Assessment and Plan: * Hematemesis - concern is for possible UGIB given DAPT therapy; denies NSAID use (aside from asa), tobacco, etoh.  - holding DAPT for now - GI following, appreciate assistance - underwent EGD on 10/22/2021 (Schatzki ring at GE junction, hiatal hernia, clean-based cratered gastric ulcer) - Hgb down to 6.7 g/dL on 10/2, received 1 unit PRBC -Okay for Plavix to be resumed on 10/23/2021 per GI - Protonix twice  daily recommended for 2 months per GI  IDA (iron deficiency anemia) - Hemoglobin 8.6 g/dL on arrival.  Appears to have a baseline around 10 to 11 g/dL - See hematemesis -Iron studies also low.  Replete with INFeD on 10/21/2021  Closed right ankle fracture - Minimally displaced right malleolus fracture - Stirrup splint currently in place; continue x 1 week then possible cast vs CAM boot per ortho -NWB to RLE per ortho -Outpatient follow-up with orthopedic surgery in 1 week for new x-rays - patient and family still amenable for home with [facility] at discharge; they declined SNF  CAD (coronary artery disease) - BP meds on hold in setting of presyncope described on admission - DAPT on hold; patient has been seen by cardiology.  Tentative plan would be for resuming Plavix when safely able (10/3 per GI) - continue lipitor  Hypertension Hold blood pressure medications due to symptomatic dizziness.  S/P CABG x 6 Stable.  Hyperlipidemia Continue Lipitor.  80 mg daily.  Postherpetic neuralgia Continue Neurontin 300 mg at bedtime along with amitriptyline 10 mg at bedtime       The patient's chronic medical conditions were treated accordingly per the patient's home medication regimen except as noted.  On day of discharge, patient was felt deemed stable for discharge. Patient/family member advised to call PCP or come back to ER if needed.   Principal Diagnosis: Hematemesis  Discharge Diagnoses: Active Hospital Problems   Diagnosis Date Noted   Hematemesis 10/20/2021  Priority: 1.   Closed right ankle fracture 10/20/2021    Priority: 2.   IDA (iron deficiency anemia) 10/20/2021    Priority: 2.   CAD (coronary artery disease) 11/23/2019    Priority: 3.   Hypertension 11/24/2019    Priority: 4.   GIB (gastrointestinal bleeding) 10/21/2021   S/P CABG x 6 11/29/2019   Hyperlipidemia 11/24/2019   Postherpetic neuralgia 09/03/2010    Resolved Hospital Problems  No resolved problems  to display.     Discharge Instructions     Diet general   Complete by: As directed    Increase activity slowly   Complete by: As directed       Allergies as of 10/23/2021       Reactions   Mango Flavor Anaphylaxis   Contrast Media [iodinated Contrast Media] Other (See Comments)   Unknown reaction        Medication List     STOP taking these medications    aspirin EC 81 MG tablet       TAKE these medications    amitriptyline 10 MG tablet Commonly known as: ELAVIL TAKE 1 TABLET BY MOUTH EVERYDAY AT BEDTIME What changed: See the new instructions.   amLODipine 2.5 MG tablet Commonly known as: NORVASC Take 2.5 mg by mouth daily.   atorvastatin 80 MG tablet Commonly known as: LIPITOR TAKE 1 TABLET BY MOUTH EVERY DAY   carvedilol 6.25 MG tablet Commonly known as: COREG Take 1 tablet (6.25 mg total) by mouth 2 (two) times daily.   clopidogrel 75 MG tablet Commonly known as: PLAVIX Take 1 tablet (75 mg total) by mouth daily.   gabapentin 300 MG capsule Commonly known as: NEURONTIN Take 900 mg by mouth at bedtime.   MULTIVITAMIN ADULT PO Take 1 tablet by mouth daily.   Nasacort Allergy 24HR 55 MCG/ACT Aero nasal inhaler Generic drug: triamcinolone Place 2 sprays into the nose daily as needed (sinus congestion).   oxyCODONE 5 MG immediate release tablet Commonly known as: Oxy IR/ROXICODONE Take 1 tablet (5 mg total) by mouth every 4 (four) hours as needed for moderate pain.   pantoprazole 40 MG tablet Commonly known as: PROTONIX Take 1 tablet (40 mg total) by mouth 2 (two) times daily before a meal.   PRESERVISION AREDS 2+MULTI VIT PO Take 2 tablets by mouth daily.   sertraline 50 MG tablet Commonly known as: ZOLOFT Take 100 mg by mouth daily.   valsartan 160 MG tablet Commonly known as: DIOVAN Take 1 tablet (160 mg total) by mouth daily.        Follow-up Information     Renette Butters, MD. Schedule an appointment as soon as possible  for a visit in 1 week(s).   Specialty: Orthopedic Surgery Contact information: [address] [place] [phone]                Allergies  Allergen Reactions   Mango Flavor Anaphylaxis   Contrast Media [Iodinated Contrast Media] Other (See Comments)    Unknown reaction    Consultations: Orthopedic surgery Cardiology GI  Procedures: EGD, 10/22/21  Discharge Exam: BP 132/62   Pulse 99   Temp 97.7 F (36.5 C) (Oral)   Resp 16   Ht '5\' 4"'$  (1.626 m)   Wt 88.2 kg   SpO2 94%   BMI 33.38 kg/m  Physical Exam Constitutional:      Appearance: Normal appearance.  HENT:     Head: Normocephalic and atraumatic.  Mouth/Throat:     Mouth: Mucous membranes are moist.  Eyes:     Extraocular Movements: Extraocular movements intact.  Cardiovascular:     Rate and Rhythm: Normal rate and regular rhythm.  Pulmonary:     Effort: Pulmonary effort is normal.     Breath sounds: Normal breath sounds.  Abdominal:     General: Bowel sounds are normal. There is no distension.     Palpations: Abdomen is soft.     Tenderness: There is no abdominal tenderness.  Musculoskeletal:     Cervical back: Normal range of motion and neck supple.     Comments: RLE in stirrup splint  Neurological:     Mental Status: She is alert.     Comments: Sensation intact in right toes; able to wiggle toes  Psychiatric:        Mood and Affect: Mood normal.        Behavior: Behavior normal.      The results of significant diagnostics from this hospitalization (including imaging, microbiology, ancillary and laboratory) are listed below for reference.   Microbiology: Recent Results (from the past 240 hour(s))  Surgical pcr screen     Status: Abnormal   Collection Time: 10/20/21  5:29 PM   Specimen: Nasal Mucosa; Nasal Swab  Result Value Ref Range Status   MRSA, PCR NEGATIVE NEGATIVE Final   Staphylococcus aureus POSITIVE (A) NEGATIVE Final    Comment:  (NOTE) The Xpert SA Assay (FDA approved for NASAL specimens in patients 57 years of age and older), is one component of a comprehensive surveillance program. It is not intended to diagnose infection nor to guide or monitor treatment. Performed at [facility], [place] [address] [place], [place] [address]      Labs: BNP (last 3 results) No results for input(s): "BNP" in the last 8760 hours. Basic Metabolic Panel: Recent Labs  Lab 10/20/21 0359 10/21/21 0412 10/22/21 0341 10/23/21 0338  NA 136 137 136 137  K 3.7 3.9 3.1* 3.6  CL 103 106 105 105  CO2 '24 26 26 26  '$ GLUCOSE 127* 118* 126* 109*  BUN '18 12 8 '$ 7*  CREATININE 0.72 0.85 0.71 0.60  CALCIUM 9.2 8.2* 8.5* 8.5*  MG  --  1.8 2.0 1.7   Liver Function Tests: Recent Labs  Lab 10/20/21 0359  AST 24  ALT 19  ALKPHOS 75  BILITOT 0.6  PROT 6.8  ALBUMIN 3.9   No results for input(s): "LIPASE", "AMYLASE" in the last 168 hours. No results for input(s): "AMMONIA" in the last 168 hours. CBC: Recent Labs  Lab 10/20/21 0359 10/20/21 1953 10/21/21 0412 10/21/21 1957 10/22/21 0341 10/22/21 1226 10/23/21 0758  WBC 14.7*  --  11.4*  --  12.1*  --  10.3  NEUTROABS 12.3*  --  7.7  --  8.9*  --  7.3  HGB 8.6*   < > 7.5* 7.2* 6.7* 8.4* 8.2*  HCT 26.9*   < > 23.8* 23.0* 21.3* 26.2* 26.1*  MCV 93.4  --  96.7  --  96.8  --  95.6  PLT 276  --  259  --  250  --  259   < > = values in this interval not displayed.   Cardiac Enzymes: No results for input(s): "CKTOTAL", "CKMB", "CKMBINDEX", "TROPONINI" in the last 168 hours. BNP: Invalid input(s): "POCBNP" CBG: No results for input(s): "GLUCAP" in the last 168 hours. D-Dimer No results for input(s): "DDIMER" in the last 72 hours. Hgb A1c  No results for input(s): "HGBA1C" in the last 72 hours. Lipid Profile No results for input(s): "CHOL", "HDL", "LDLCALC", "TRIG", "CHOLHDL", "LDLDIRECT" in the last 72 hours. Thyroid function studies No results for input(s):  "TSH", "T4TOTAL", "T3FREE", "THYROIDAB" in the last 72 hours.  Invalid input(s): "FREET3" Anemia work up Recent Labs    10/21/21 0412  TIBC 294  IRON 14*   Urinalysis    Component Value Date/Time   COLORURINE STRAW (A) 11/25/2019 1300   APPEARANCEUR CLEAR 11/25/2019 1300   LABSPEC 1.003 (L) 11/25/2019 1300   PHURINE 6.0 11/25/2019 1300   GLUCOSEU NEGATIVE 11/25/2019 1300   HGBUR NEGATIVE 11/25/2019 1300   BILIRUBINUR NEGATIVE 11/25/2019 1300   KETONESUR NEGATIVE 11/25/2019 1300   PROTEINUR NEGATIVE 11/25/2019 1300   NITRITE NEGATIVE 11/25/2019 1300   LEUKOCYTESUR SMALL (A) 11/25/2019 1300   Sepsis Labs Recent Labs  Lab 10/20/21 0359 10/21/21 0412 10/22/21 0341 10/23/21 0758  WBC 14.7* 11.4* 12.1* 10.3   Microbiology Recent Results (from the past 240 hour(s))  Surgical pcr screen     Status: Abnormal   Collection Time: 10/20/21  5:29 PM   Specimen: Nasal Mucosa; Nasal Swab  Result Value Ref Range Status   MRSA, PCR NEGATIVE NEGATIVE Final   Staphylococcus aureus POSITIVE (A) NEGATIVE Final    Comment: (NOTE) The Xpert SA Assay (FDA approved for NASAL specimens in patients 57 years of age and older), is one component of a comprehensive surveillance program. It is not intended to diagnose infection nor to guide or monitor treatment. Performed at [facility], [place] [address] [place], [place] [address]     Procedures/Studies: Darletta Moll Ankle Complete Right  Result Date: 10/20/2021 CLINICAL DATA:  Fall, medial ankle pain, swelling, bruising EXAM: RIGHT ANKLE - COMPLETE 3 VIEW COMPARISON:  None Available. FINDINGS: Fracture through the medial malleolus which is minimally displaced. Overlying soft tissue swelling. The ankle mortise is preserved. IMPRESSION: Minimally displaced fracture through the medial malleolus. Electronically Signed   By: Merilyn Baba M.D.   On: 10/20/2021 03:24     Time coordinating discharge: Over 30 minutes    Dwyane Dee,  MD  [facility] 10/23/2021, 3:29 PM

## 2021-10-23 NOTE — Progress Notes (Signed)
Dicharge package printed and instructions given to husband and patient.

## 2021-10-23 NOTE — Plan of Care (Signed)

## 2021-10-24 ENCOUNTER — Emergency Department (HOSPITAL_COMMUNITY): Payer: Medicare Other

## 2021-10-24 ENCOUNTER — Encounter (HOSPITAL_COMMUNITY): Payer: Self-pay | Admitting: Emergency Medicine

## 2021-10-24 ENCOUNTER — Observation Stay (HOSPITAL_COMMUNITY)
Admission: EM | Admit: 2021-10-24 | Discharge: 2021-10-26 | Disposition: A | Discharge status: Skilled Nursing Facility | Payer: Medicare Other | Attending: Internal Medicine | Admitting: Internal Medicine

## 2021-10-24 DIAGNOSIS — N3001 Acute cystitis with hematuria: Secondary | ICD-10-CM | POA: Diagnosis not present

## 2021-10-24 DIAGNOSIS — S82841D Displaced bimalleolar fracture of right lower leg, subsequent encounter for closed fracture with routine healing: Secondary | ICD-10-CM

## 2021-10-24 DIAGNOSIS — Z743 Need for continuous supervision: Secondary | ICD-10-CM | POA: Diagnosis not present

## 2021-10-24 DIAGNOSIS — I480 Paroxysmal atrial fibrillation: Secondary | ICD-10-CM | POA: Insufficient documentation

## 2021-10-24 DIAGNOSIS — Z8673 Personal history of transient ischemic attack (TIA), and cerebral infarction without residual deficits: Secondary | ICD-10-CM | POA: Insufficient documentation

## 2021-10-24 DIAGNOSIS — E876 Hypokalemia: Secondary | ICD-10-CM | POA: Insufficient documentation

## 2021-10-24 DIAGNOSIS — N390 Urinary tract infection, site not specified: Secondary | ICD-10-CM | POA: Diagnosis not present

## 2021-10-24 DIAGNOSIS — S8264XA Nondisplaced fracture of lateral malleolus of right fibula, initial encounter for closed fracture: Secondary | ICD-10-CM | POA: Diagnosis not present

## 2021-10-24 DIAGNOSIS — X58XXXA Exposure to other specified factors, initial encounter: Secondary | ICD-10-CM | POA: Insufficient documentation

## 2021-10-24 DIAGNOSIS — B9620 Unspecified Escherichia coli [E. coli] as the cause of diseases classified elsewhere: Secondary | ICD-10-CM | POA: Insufficient documentation

## 2021-10-24 DIAGNOSIS — E785 Hyperlipidemia, unspecified: Secondary | ICD-10-CM

## 2021-10-24 DIAGNOSIS — Z79899 Other long term (current) drug therapy: Secondary | ICD-10-CM | POA: Diagnosis not present

## 2021-10-24 DIAGNOSIS — S99919A Unspecified injury of unspecified ankle, initial encounter: Secondary | ICD-10-CM | POA: Diagnosis not present

## 2021-10-24 DIAGNOSIS — I10 Essential (primary) hypertension: Secondary | ICD-10-CM | POA: Diagnosis not present

## 2021-10-24 DIAGNOSIS — R531 Weakness: Secondary | ICD-10-CM

## 2021-10-24 DIAGNOSIS — M25571 Pain in right ankle and joints of right foot: Secondary | ICD-10-CM | POA: Diagnosis present

## 2021-10-24 DIAGNOSIS — D509 Iron deficiency anemia, unspecified: Secondary | ICD-10-CM | POA: Diagnosis not present

## 2021-10-24 DIAGNOSIS — Z7902 Long term (current) use of antithrombotics/antiplatelets: Secondary | ICD-10-CM | POA: Insufficient documentation

## 2021-10-24 DIAGNOSIS — S82843A Displaced bimalleolar fracture of unspecified lower leg, initial encounter for closed fracture: Secondary | ICD-10-CM | POA: Diagnosis present

## 2021-10-24 DIAGNOSIS — S8254XA Nondisplaced fracture of medial malleolus of right tibia, initial encounter for closed fracture: Secondary | ICD-10-CM | POA: Diagnosis not present

## 2021-10-24 DIAGNOSIS — S82841A Displaced bimalleolar fracture of right lower leg, initial encounter for closed fracture: Secondary | ICD-10-CM | POA: Diagnosis not present

## 2021-10-24 DIAGNOSIS — I4891 Unspecified atrial fibrillation: Secondary | ICD-10-CM

## 2021-10-24 DIAGNOSIS — I2510 Atherosclerotic heart disease of native coronary artery without angina pectoris: Secondary | ICD-10-CM | POA: Insufficient documentation

## 2021-10-24 DIAGNOSIS — B961 Klebsiella pneumoniae [K. pneumoniae] as the cause of diseases classified elsewhere: Secondary | ICD-10-CM | POA: Diagnosis not present

## 2021-10-24 DIAGNOSIS — M7989 Other specified soft tissue disorders: Secondary | ICD-10-CM | POA: Diagnosis not present

## 2021-10-24 DIAGNOSIS — Z951 Presence of aortocoronary bypass graft: Secondary | ICD-10-CM

## 2021-10-24 LAB — TYPE AND SCREEN
ABO/RH(D): O POS
Antibody Screen: NEGATIVE
Unit division: 0
Unit division: 0
Unit division: 0

## 2021-10-24 LAB — CBC WITH DIFFERENTIAL/PLATELET
Abs Immature Granulocytes: 0.05 10*3/uL (ref 0.00–0.07)
Basophils Absolute: 0 10*3/uL (ref 0.0–0.1)
Basophils Relative: 0 %
Eosinophils Absolute: 0.2 10*3/uL (ref 0.0–0.5)
Eosinophils Relative: 1 %
HCT: 25.7 % — ABNORMAL LOW (ref 36.0–46.0)
Hemoglobin: 8.5 g/dL — ABNORMAL LOW (ref 12.0–15.0)
Immature Granulocytes: 0 %
Lymphocytes Relative: 9 %
Lymphs Abs: 1 10*3/uL (ref 0.7–4.0)
MCH: 30.8 pg (ref 26.0–34.0)
MCHC: 33.1 g/dL (ref 30.0–36.0)
MCV: 93.1 fL (ref 80.0–100.0)
Monocytes Absolute: 0.8 10*3/uL (ref 0.1–1.0)
Monocytes Relative: 7 %
Neutro Abs: 9.4 10*3/uL — ABNORMAL HIGH (ref 1.7–7.7)
Neutrophils Relative %: 83 %
Platelets: 317 10*3/uL (ref 150–400)
RBC: 2.76 MIL/uL — ABNORMAL LOW (ref 3.87–5.11)
RDW: 15.2 % (ref 11.5–15.5)
WBC: 11.4 10*3/uL — ABNORMAL HIGH (ref 4.0–10.5)
nRBC: 0 % (ref 0.0–0.2)

## 2021-10-24 LAB — COMPREHENSIVE METABOLIC PANEL
ALT: 17 U/L (ref 0–44)
AST: 21 U/L (ref 15–41)
Albumin: 2.7 g/dL — ABNORMAL LOW (ref 3.5–5.0)
Alkaline Phosphatase: 64 U/L (ref 38–126)
Anion gap: 12 (ref 5–15)
BUN: 10 mg/dL (ref 8–23)
CO2: 22 mmol/L (ref 22–32)
Calcium: 8.7 mg/dL — ABNORMAL LOW (ref 8.9–10.3)
Chloride: 101 mmol/L (ref 98–111)
Creatinine, Ser: 0.66 mg/dL (ref 0.44–1.00)
GFR, Estimated: >60 mL/min (ref >60)
Glucose, Bld: 107 mg/dL — ABNORMAL HIGH (ref 70–99)
Potassium: 3.6 mmol/L (ref 3.5–5.1)
Sodium: 135 mmol/L (ref 135–145)
Total Bilirubin: 1 mg/dL (ref 0.3–1.2)
Total Protein: 5.4 g/dL — ABNORMAL LOW (ref 6.5–8.1)

## 2021-10-24 LAB — URINALYSIS, ROUTINE W REFLEX MICROSCOPIC
Bilirubin Urine: NEGATIVE
Glucose, UA: NEGATIVE mg/dL
Ketones, ur: 20 mg/dL — AB
Nitrite: NEGATIVE
Protein, ur: 30 mg/dL — AB
Specific Gravity, Urine: 1.013 (ref 1.005–1.030)
WBC, UA: >50 WBC/hpf — ABNORMAL HIGH (ref 0–5)
pH: 6 (ref 5.0–8.0)

## 2021-10-24 LAB — BPAM RBC
Blood Product Expiration Date: 202310312359
Blood Product Expiration Date: 202310312359
Blood Product Expiration Date: 202311022359
ISSUE DATE / TIME: 202310020630
Unit Type and Rh: 5100
Unit Type and Rh: 5100
Unit Type and Rh: 5100

## 2021-10-24 MED ORDER — CLOPIDOGREL BISULFATE 75 MG PO TABS
75.0000 mg | ORAL_TABLET | Freq: Every day | ORAL | Status: DC
  Administered 2021-10-25 – 2021-10-26 (×2): 75 mg via ORAL
  Filled 2021-10-24 (×2): qty 1

## 2021-10-24 MED ORDER — IRBESARTAN 150 MG PO TABS
150.0000 mg | ORAL_TABLET | Freq: Every day | ORAL | Status: DC
  Administered 2021-10-25 – 2021-10-26 (×2): 150 mg via ORAL
  Filled 2021-10-24 (×2): qty 1

## 2021-10-24 MED ORDER — SERTRALINE HCL 100 MG PO TABS
100.0000 mg | ORAL_TABLET | Freq: Every day | ORAL | Status: DC
  Administered 2021-10-25 – 2021-10-26 (×2): 100 mg via ORAL
  Filled 2021-10-24 (×2): qty 1

## 2021-10-24 MED ORDER — AMITRIPTYLINE HCL 10 MG PO TABS
10.0000 mg | ORAL_TABLET | Freq: Every day | ORAL | Status: DC
  Administered 2021-10-25 (×2): 10 mg via ORAL
  Filled 2021-10-24 (×3): qty 1

## 2021-10-24 MED ORDER — SODIUM CHLORIDE 0.9% FLUSH
3.0000 mL | Freq: Two times a day (BID) | INTRAVENOUS | Status: DC
  Administered 2021-10-24 – 2021-10-25 (×2): 3 mL via INTRAVENOUS

## 2021-10-24 MED ORDER — LACTATED RINGERS IV BOLUS
500.0000 mL | Freq: Once | INTRAVENOUS | Status: AC
  Administered 2021-10-24: 500 mL via INTRAVENOUS

## 2021-10-24 MED ORDER — ACETAMINOPHEN 650 MG RE SUPP: 650.0000 mg | Freq: Four times a day (QID) | RECTAL | Status: DC | PRN

## 2021-10-24 MED ORDER — AMLODIPINE BESYLATE 2.5 MG PO TABS
2.5000 mg | ORAL_TABLET | Freq: Every day | ORAL | Status: DC
  Administered 2021-10-25 – 2021-10-26 (×2): 2.5 mg via ORAL
  Filled 2021-10-24 (×2): qty 1

## 2021-10-24 MED ORDER — CARVEDILOL 6.25 MG PO TABS
6.2500 mg | ORAL_TABLET | Freq: Two times a day (BID) | ORAL | Status: DC
  Administered 2021-10-25 – 2021-10-26 (×3): 6.25 mg via ORAL
  Filled 2021-10-24 (×3): qty 1

## 2021-10-24 MED ORDER — POLYETHYLENE GLYCOL 3350 17 G PO PACK: 17.0000 g | PACK | Freq: Every day | ORAL | Status: DC | PRN

## 2021-10-24 MED ORDER — PANTOPRAZOLE SODIUM 40 MG PO TBEC
40.0000 mg | DELAYED_RELEASE_TABLET | Freq: Two times a day (BID) | ORAL | Status: DC
  Administered 2021-10-25 – 2021-10-26 (×3): 40 mg via ORAL
  Filled 2021-10-24 (×3): qty 1

## 2021-10-24 MED ORDER — GABAPENTIN 300 MG PO CAPS
900.0000 mg | ORAL_CAPSULE | Freq: Every day | ORAL | Status: DC
  Administered 2021-10-25 (×2): 900 mg via ORAL
  Filled 2021-10-24 (×2): qty 3

## 2021-10-24 MED ORDER — ATORVASTATIN CALCIUM 80 MG PO TABS
80.0000 mg | ORAL_TABLET | Freq: Every day | ORAL | Status: DC
  Administered 2021-10-25 – 2021-10-26 (×2): 80 mg via ORAL
  Filled 2021-10-24 (×2): qty 1

## 2021-10-24 MED ORDER — OXYCODONE HCL 5 MG PO TABS
5.0000 mg | ORAL_TABLET | ORAL | Status: DC | PRN
  Administered 2021-10-26: 5 mg via ORAL
  Filled 2021-10-24: qty 1

## 2021-10-24 MED ORDER — SODIUM CHLORIDE 0.9 % IV SOLN
1.0000 g | Freq: Once | INTRAVENOUS | Status: AC
  Administered 2021-10-24: 1 g via INTRAVENOUS
  Filled 2021-10-24: qty 10

## 2021-10-24 MED ORDER — ACETAMINOPHEN 325 MG PO TABS: 650.0000 mg | ORAL_TABLET | Freq: Four times a day (QID) | ORAL | Status: DC | PRN

## 2021-10-24 NOTE — ED Provider Notes (Signed)
[facility] EMERGENCY DEPARTMENT Provider Note  CSN: [id number] Arrival date & time: 10/24/21 1153  Chief Complaint(s) Decreased Mobility  HPI Jamie Hodge is a 81 y.o. female history of coronary artery disease, prior stroke, recent admission for bleeding from gastric ulcer status post intervention, also found to have right ankle fracture presenting with generalized weakness.  Patient was discharged from the hospital yesterday, was hoping to have home physical therapy, but just had severe weakness and was unable to ambulate at home, unable to get to the bathroom, had to have firefighters help her into the house after discharge, and is just not doing well.  The patient denies any new or worsening pain such as chest pain, abdominal pain, nausea or vomiting, fevers or chills, headaches, dysuria.  She reports her symptoms are constant.  She initially declined inpatient rehabilitation but now is requesting to proceed with this as she is unable to take care of herself at home   Past Medical History Past Medical History:  Diagnosis Date   Coronary artery disease    CVA (cerebral vascular accident) ([place])    Herpes zoster 2012   HERPES ZOSTER 08/13/2010   Qualifier: Diagnosis of  By: Koleen Nimrod MD, Jeffrey     Stroke [facility] 09/25/2019   TIA (transient ischemic attack) 09/23/2019   Vertigo    Patient Active Problem List   Diagnosis Date Noted   UTI (urinary tract infection) 10/24/2021   Ankle fracture, bimalleolar, closed 10/24/2021   GIB (gastrointestinal bleeding) 10/21/2021   Hematemesis 10/20/2021   Closed right ankle fracture 10/20/2021   IDA (iron deficiency anemia) 10/20/2021   Atrial fibrillation ([place]) 12/07/2019   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 Authorizing Provider  amitriptyline (ELAVIL) 10 MG tablet TAKE 1 TABLET BY MOUTH EVERYDAY AT BEDTIME Patient taking differently: Take 10 mg by mouth at bedtime. 09/20/20   Frann Rider, NP  amLODipine (NORVASC) 2.5 MG tablet Take 2.5 mg by mouth daily.    [provider]  atorvastatin (LIPITOR) 80 MG tablet TAKE 1 TABLET BY MOUTH EVERY DAY Patient taking differently: Take 80 mg by mouth daily. 10/02/20   Frann Rider, NP  carvedilol (COREG) 6.25 MG tablet Take 1 tablet (6.25 mg total) by mouth 2 (two) times daily. 12/11/20   Elouise Munroe, MD  clopidogrel (PLAVIX) 75 MG tablet Take 1 tablet (75 mg total) by mouth daily. 02/26/21   Elouise Munroe, MD  gabapentin (NEURONTIN) 300 MG capsule Take 900 mg by mouth at bedtime.    [provider]  Multiple Vitamin (MULTIVITAMIN ADULT PO) Take 1 tablet by mouth daily.    [provider]  Multiple Vitamins-Minerals (PRESERVISION AREDS 2+MULTI VIT PO) Take 2 tablets by mouth daily.    [provider]  oxyCODONE (OXY IR/ROXICODONE) 5 MG immediate release tablet Take 1 tablet (5 mg total) by mouth every 4 (four) hours as needed for moderate pain. 10/23/21   Dwyane Dee, MD  pantoprazole (PROTONIX) 40 MG tablet Take 1 tablet (40 mg total) by mouth 2 (two) times daily before a meal. 10/23/21   Dwyane Dee, MD  sertraline (ZOLOFT) 50 MG tablet Take 100 mg by mouth daily.    [provider]  triamcinolone (NASACORT ALLERGY 24HR) 55 MCG/ACT AERO nasal inhaler Place 2 sprays  into the nose daily as needed (sinus congestion).    [provider]  valsartan (DIOVAN) 160 MG tablet Take 1 tablet (160 mg total) by mouth daily. 06/04/21   Elouise Munroe, MD  isosorbide mononitrate (IMDUR) 30 MG 24 hr tablet Take 1 tablet (30 mg total) by mouth daily. 11/25/19 12/06/19  Jettie Booze, MD                                                                                                                                     Past Surgical History Past Surgical History:  Procedure Laterality Date   BIOPSY  10/22/2021   Procedure: BIOPSY;  Surgeon: [name], MD;  Location: [facility];  Service: Gastroenterology;;   CARDIAC CATHETERIZATION     CORONARY ARTERY BYPASS GRAFT N/A 11/29/2019   Procedure: CORONARY ARTERY BYPASS GRAFTING (CABG) x 6 ON PUMP. USING LEFT INTERNAL MAMMARY ARTERY, LEFT RADIAL ARTERY AND RIGHT GREATER SAPHENOUS VEIN.;  Surgeon: Wonda Olds, MD;  Location: [facility];  Service: Open Heart Surgery;  Laterality: N/A;   ENDOVEIN HARVEST OF GREATER SAPHENOUS VEIN Right 11/29/2019   Procedure: ENDOVEIN HARVEST OF GREATER SAPHENOUS VEIN;  Surgeon: Wonda Olds, MD;  Location: [facility];  Service: Open Heart Surgery;  Laterality: Right;   ESOPHAGOGASTRODUODENOSCOPY N/A 10/22/2021   Procedure: ESOPHAGOGASTRODUODENOSCOPY (EGD);  Surgeon: [name], MD;  Location: [facility];  Service: Gastroenterology;  Laterality: N/A;   HEMOSTASIS CLIP PLACEMENT  10/22/2021   Procedure: HEMOSTASIS CLIP PLACEMENT;  Surgeon: [name], MD;  Location: [facility];  Service: Gastroenterology;;   LEFT HEART CATH AND CORONARY ANGIOGRAPHY N/A 11/23/2019   Procedure: LEFT HEART CATH AND CORONARY ANGIOGRAPHY;  Surgeon: Jettie Booze, MD;  Location: [facility];  Service: Cardiovascular;  Laterality: N/A;   RADIAL ARTERY HARVEST Left 11/29/2019   Procedure: RADIAL ARTERY HARVEST;  Surgeon: Wonda Olds, MD;  Location: [place];  Service: Open Heart Surgery;  Laterality: Left;   RHINOPLASTY     TEE WITHOUT CARDIOVERSION N/A 11/29/2019   Procedure: TRANSESOPHAGEAL ECHOCARDIOGRAM (TEE);  Surgeon: Wonda Olds, MD;  Location: [facility];  Service: Open Heart Surgery;  Laterality: N/A;   Family History Family History  Problem Relation Age of Onset   Unexplained death Mother        Died at the age of 11, no autopsy performed   CAD Father 33   Lung cancer Father 69    Social  History Social History   Tobacco Use   Smoking status: Never   Smokeless tobacco: Never  Vaping Use   Vaping Use: Never used  Substance Use Topics   Alcohol use: No   Drug use: No   Allergies Mango flavor and Contrast media [iodinated contrast media]  Review of Systems Review of Systems  All other systems reviewed and are negative.   Physical Exam Vital Signs  I have reviewed the triage vital  signs BP 120/65 (BP Location: Left Arm)   Pulse 70   Temp 98 F (36.7 C) (Oral)   Resp 17   SpO2 97%  Physical Exam Vitals and nursing note reviewed.  Constitutional:      General: She is not in acute distress.    Appearance: She is well-developed.  HENT:     Head: Normocephalic and atraumatic.     Mouth/Throat:     Mouth: Mucous membranes are moist.  Eyes:     Pupils: Pupils are equal, round, and reactive to light.  Cardiovascular:     Rate and Rhythm: Normal rate and regular rhythm.     Heart sounds: No murmur heard. Pulmonary:     Effort: Pulmonary effort is normal. No respiratory distress.     Breath sounds: Normal breath sounds.  Abdominal:     General: Abdomen is flat.     Palpations: Abdomen is soft.     Tenderness: There is no abdominal tenderness.  Musculoskeletal:        General: No tenderness.     Right lower leg: No edema.     Left lower leg: No edema.     Comments: Right ankle in posterior splint with stirrup, normal capillary refill, able to wiggle the toes  Skin:    General: Skin is warm and dry.  Neurological:     General: No focal deficit present.     Mental Status: She is alert. Mental status is at baseline.  Psychiatric:        Mood and Affect: Mood normal.        Behavior: Behavior normal.     ED Results and Treatments Labs (all labs ordered are listed, but only abnormal results are displayed) Labs Reviewed  CBC WITH DIFFERENTIAL/PLATELET - Abnormal; Notable for the following components:      Result Value   WBC 11.4 (*)    RBC 2.76 (*)     Hemoglobin 8.5 (*)    HCT 25.7 (*)    Neutro Abs 9.4 (*)    All other components within normal limits  COMPREHENSIVE METABOLIC PANEL - Abnormal; Notable for the following components:   Glucose, Bld 107 (*)    Calcium 8.7 (*)    Total Protein 5.4 (*)    Albumin 2.7 (*)    All other components within normal limits  URINALYSIS, ROUTINE W REFLEX MICROSCOPIC - Abnormal; Notable for the following components:   APPearance CLOUDY (*)    Hgb urine dipstick SMALL (*)    Ketones, ur 20 (*)    Protein, ur 30 (*)    Leukocytes,Ua LARGE (*)    WBC, UA >50 (*)    Bacteria, UA MANY (*)    Non Squamous Epithelial 0-5 (*)    All other components within normal limits  URINE CULTURE  CBC  COMPREHENSIVE METABOLIC PANEL  Radiology DG Ankle Complete Right  Result Date: 10/24/2021 CLINICAL DATA:  Status post trauma. EXAM: RIGHT ANKLE - COMPLETE 3+ VIEW COMPARISON:  October 20, 2021 FINDINGS: Acute, nondisplaced fractures are seen involving the right medial malleolus and right lateral malleolus. There is no evidence of dislocation. There is no evidence of arthropathy or other focal bone abnormality. Moderate severity diffuse soft tissue swelling is noted. IMPRESSION: Acute fractures of the right medial malleolus and right lateral malleolus. Electronically Signed   By: Virgina Norfolk M.D.   On: 10/24/2021 20:24    Pertinent labs & imaging results that were available during my care of the patient were reviewed by me and considered in my medical decision making (see MDM for details).  Medications Ordered in ED Medications  cefTRIAXone (ROCEPHIN) 1 g in sodium chloride 0.9 % 100 mL IVPB (has no administration in time range)  oxyCODONE (Oxy IR/ROXICODONE) immediate release tablet 5 mg (has no administration in time range)  amLODipine (NORVASC) tablet 2.5 mg (has no administration in  time range)  atorvastatin (LIPITOR) tablet 80 mg (has no administration in time range)  carvedilol (COREG) tablet 6.25 mg (has no administration in time range)  irbesartan (AVAPRO) tablet 150 mg (has no administration in time range)  amitriptyline (ELAVIL) tablet 10 mg (has no administration in time range)  sertraline (ZOLOFT) tablet 100 mg (has no administration in time range)  clopidogrel (PLAVIX) tablet 75 mg (has no administration in time range)  gabapentin (NEURONTIN) capsule 900 mg (has no administration in time range)  sodium chloride flush (NS) 0.9 % injection 3 mL (has no administration in time range)  acetaminophen (TYLENOL) tablet 650 mg (has no administration in time range)    Or  acetaminophen (TYLENOL) suppository 650 mg (has no administration in time range)  polyethylene glycol (MIRALAX / GLYCOLAX) packet 17 g (has no administration in time range)  lactated ringers bolus 500 mL (0 mLs Intravenous Stopped 10/24/21 2205)                                                                                                                                     Procedures Procedures  (including critical care time)  Medical Decision Making / ED Course   MDM:  81 year old female presenting to the emergency department generalized weakness.  Patient overall well-appearing, vital signs reassuring.  Physical exam notable for splint, otherwise unremarkable.  Obtain basic labs including urinalysis.  labs reassuring including stable hemoglobin.  Patient further denies any ongoing melena to suggest further GI bleeding.  Urinalysis is suggestive of urinary infection.  Although patient has no symptoms, given her generalized weakness and quite positive urinalysis will treat with IV antibiotics.  Urine culture has been sent.  Discussed with the hospitalist who will admit the patient for treatment of urinary tract infection, patient will further work with PT and likely be placed in temporary  inpatient/skilled nursing rehab as previously recommended during prior  hospitalization      Additional history obtained: -Additional history obtained from family -External records from outside source obtained and reviewed including: Chart review including previous notes, labs, imaging, consultation notes   Lab Tests: -I ordered, reviewed, and interpreted labs.   The pertinent results include:   Labs Reviewed  CBC WITH DIFFERENTIAL/PLATELET - Abnormal; Notable for the following components:      Result Value   WBC 11.4 (*)    RBC 2.76 (*)    Hemoglobin 8.5 (*)    HCT 25.7 (*)    Neutro Abs 9.4 (*)    All other components within normal limits  COMPREHENSIVE METABOLIC PANEL - Abnormal; Notable for the following components:   Glucose, Bld 107 (*)    Calcium 8.7 (*)    Total Protein 5.4 (*)    Albumin 2.7 (*)    All other components within normal limits  URINALYSIS, ROUTINE W REFLEX MICROSCOPIC - Abnormal; Notable for the following components:   APPearance CLOUDY (*)    Hgb urine dipstick SMALL (*)    Ketones, ur 20 (*)    Protein, ur 30 (*)    Leukocytes,Ua LARGE (*)    WBC, UA >50 (*)    Bacteria, UA MANY (*)    Non Squamous Epithelial 0-5 (*)    All other components within normal limits  URINE CULTURE  CBC  COMPREHENSIVE METABOLIC PANEL      EKG   EKG Interpretation  Date/Time:    Ventricular Rate:    PR Interval:    QRS Duration:   QT Interval:    QTC Calculation:   R Axis:     Text Interpretation:           Imaging Studies ordered: I ordered imaging studies including XR ankle On my interpretation imaging demonstrates fracture I independently visualized and interpreted imaging. I agree with the radiologist interpretation   Medicines ordered and prescription drug management: Meds ordered this encounter  Medications   lactated ringers bolus 500 mL   cefTRIAXone (ROCEPHIN) 1 g in sodium chloride 0.9 % 100 mL IVPB    Order Specific Question:    Antibiotic Indication:    Answer:   UTI   oxyCODONE (Oxy IR/ROXICODONE) immediate release tablet 5 mg   amLODipine (NORVASC) tablet 2.5 mg   atorvastatin (LIPITOR) tablet 80 mg   carvedilol (COREG) tablet 6.25 mg   irbesartan (AVAPRO) tablet 150 mg   amitriptyline (ELAVIL) tablet 10 mg   sertraline (ZOLOFT) tablet 100 mg   clopidogrel (PLAVIX) tablet 75 mg   gabapentin (NEURONTIN) capsule 900 mg   sodium chloride flush (NS) 0.9 % injection 3 mL   OR Linked Order Group    acetaminophen (TYLENOL) tablet 650 mg    acetaminophen (TYLENOL) suppository 650 mg   polyethylene glycol (MIRALAX / GLYCOLAX) packet 17 g    -I have reviewed the patients home medicines and have made adjustments as needed   Consultations Obtained: I requested consultation with the hospitalist,  and discussed lab and imaging findings as well as pertinent plan - they recommend: admit   Cardiac Monitoring: The patient was maintained on a cardiac monitor.  I personally viewed and interpreted the cardiac monitored which showed an underlying rhythm of: NSR  Social Determinants of Health:  Factors impacting patients care include: obesity   Reevaluation: After the interventions noted above, I reevaluated the patient and found that they have improved  Co morbidities that complicate the patient evaluation  Past Medical History:  Diagnosis  Date   Coronary artery disease    CVA (cerebral vascular accident) [facility])    Herpes zoster 2012   HERPES ZOSTER 08/13/2010   Qualifier: Diagnosis of  By: Koleen Nimrod MD, Dellis Filbert     Stroke [facility]) 09/25/2019   TIA (transient ischemic attack) 09/23/2019   Vertigo       Dispostion: Admit    Final Clinical Impression(s) / ED Diagnoses Final diagnoses:  Urinary tract infection without hematuria, site unspecified  Generalized weakness     This chart was dictated using voice recognition software.  Despite best efforts to proofread,  errors can occur which can change the  documentation meaning.    Cristie Hem, MD 10/24/21 2243

## 2021-10-24 NOTE — ED Triage Notes (Signed)
Patient BIB [facility] from home for decreased mobility and difficulty caring or herself after breaking her right ankle last week. Patient requesting assistance with placement in a facility assist while she heals. Patient is alert, oriented, and in no apparent distress at this time.

## 2021-10-24 NOTE — H&P (Signed)
History and Physical   Jamie Hodge JJO:[id number] DOB: 06-30-1940 DOA: 10/24/2021  PCP: [facility] [place]   Patient coming from: Home  Chief Complaint: Decreased ability, ankle pain  HPI: Jamie Hodge is a 81 y.o. female with medical history significant of anemia, CAD status post CABG, hypertension, hyperlipidemia, atrial fibrillation, CVA, postherpetic neuralgia, GI bleed presenting with decreased mobility and significant pain.  Patient recently admitted from 9/30 until 10/3 for upper GI bleed with EGD performed showing gastric ulcer with 1 unit of blood transfusion and plan for PPI with Plavix resumed on discharge.  Also noted to have right medial malleoli are fracture that occurred during syncopal event related to patient's anemia.  Was placed in a splint with plan for cast versus boot on follow-up.  SNF was recommended but patient preferred to go home.  Since returning home patient has had continued decreased mobility, significant pain and difficulty caring for herself due to her ankle. She is now hoping to be placed in rehab facility.  She denies fevers, chills, chest pain, shortness of breath, Donnell pain, constipation, diarrhea, nausea, vomiting.  ED Course: Vital signs in the ED stable.  Lab work-up included CMP with glucose 107, calcium 8.7, protein 5.4, albumin 2.3.  CBC with leukocytosis 11.4 and hemoglobin stable at 8.5. Urinalysis with leukocytes, white cells, many bacteria, hemoglobin.  Urine culture pending.  Right ankle x-ray showing acute right medial and lateral malleolus fractures.  Patient received 500 cc IV fluid in the ED.  Review of Systems: As per HPI otherwise all other systems reviewed and are negative.  Past Medical History:  Diagnosis Date   Coronary artery disease    CVA (cerebral vascular accident) ([place])    Herpes zoster 2012   HERPES ZOSTER 08/13/2010   Qualifier: Diagnosis of  By: Koleen Nimrod MD, Jeffrey     Stroke [facility])  09/25/2019   TIA (transient ischemic attack) 09/23/2019   Vertigo     Past Surgical History:  Procedure Laterality Date   BIOPSY  10/22/2021   Procedure: BIOPSY;  Surgeon: [name], MD;  Location: [facility];  Service: Gastroenterology;;   CARDIAC CATHETERIZATION     CORONARY ARTERY BYPASS GRAFT N/A 11/29/2019   Procedure: CORONARY ARTERY BYPASS GRAFTING (CABG) x 6 ON PUMP. USING LEFT INTERNAL MAMMARY ARTERY, LEFT RADIAL ARTERY AND RIGHT GREATER SAPHENOUS VEIN.;  Surgeon: Wonda Olds, MD;  Location: [place];  Service: Open Heart Surgery;  Laterality: N/A;   ENDOVEIN HARVEST OF GREATER SAPHENOUS VEIN Right 11/29/2019   Procedure: ENDOVEIN HARVEST OF GREATER SAPHENOUS VEIN;  Surgeon: Wonda Olds, MD;  Location: [place];  Service: Open Heart Surgery;  Laterality: Right;   ESOPHAGOGASTRODUODENOSCOPY N/A 10/22/2021   Procedure: ESOPHAGOGASTRODUODENOSCOPY (EGD);  Surgeon: [name], MD;  Location: [facility];  Service: Gastroenterology;  Laterality: N/A;   HEMOSTASIS CLIP PLACEMENT  10/22/2021   Procedure: HEMOSTASIS CLIP PLACEMENT;  Surgeon: [name], MD;  Location: [facility];  Service: Gastroenterology;;   LEFT HEART CATH AND CORONARY ANGIOGRAPHY N/A 11/23/2019   Procedure: LEFT HEART CATH AND CORONARY ANGIOGRAPHY;  Surgeon: Jettie Booze, MD;  Location: [place] [facility];  Service: Cardiovascular;  Laterality: N/A;   RADIAL ARTERY HARVEST Left 11/29/2019   Procedure: RADIAL ARTERY HARVEST;  Surgeon: Wonda Olds, MD;  Location: [place];  Service: Open Heart Surgery;  Laterality: Left;   RHINOPLASTY     TEE WITHOUT CARDIOVERSION N/A 11/29/2019   Procedure: TRANSESOPHAGEAL ECHOCARDIOGRAM (TEE);  Surgeon: Fredrich Romans  Z, MD;  Location: [place];  Service: Open Heart Surgery;  Laterality: N/A;    Social History  reports that she has never smoked. She has never used smokeless tobacco. She reports that she does not drink alcohol and does not use drugs.  Allergies  Allergen  Reactions   Mango Flavor Anaphylaxis   Contrast Media [Iodinated Contrast Media] Other (See Comments)    Unknown reaction    Family History  Problem Relation Age of Onset   Unexplained death Mother        Died at the age of 72, no autopsy performed   CAD Father 50   Lung cancer Father 65  Reviewed on admission  Prior to Admission medications   Medication Sig Start Date End Date Taking? Authorizing Provider  amitriptyline (ELAVIL) 10 MG tablet TAKE 1 TABLET BY MOUTH EVERYDAY AT BEDTIME Patient taking differently: Take 10 mg by mouth at bedtime. 09/20/20   Frann Rider, NP  amLODipine (NORVASC) 2.5 MG tablet Take 2.5 mg by mouth daily.    [provider]  atorvastatin (LIPITOR) 80 MG tablet TAKE 1 TABLET BY MOUTH EVERY DAY Patient taking differently: Take 80 mg by mouth daily. 10/02/20   Frann Rider, NP  carvedilol (COREG) 6.25 MG tablet Take 1 tablet (6.25 mg total) by mouth 2 (two) times daily. 12/11/20   Elouise Munroe, MD  clopidogrel (PLAVIX) 75 MG tablet Take 1 tablet (75 mg total) by mouth daily. 02/26/21   Elouise Munroe, MD  gabapentin (NEURONTIN) 300 MG capsule Take 900 mg by mouth at bedtime.    [provider]  Multiple Vitamin (MULTIVITAMIN ADULT PO) Take 1 tablet by mouth daily.    [provider]  Multiple Vitamins-Minerals (PRESERVISION AREDS 2+MULTI VIT PO) Take 2 tablets by mouth daily.    [provider]  oxyCODONE (OXY IR/ROXICODONE) 5 MG immediate release tablet Take 1 tablet (5 mg total) by mouth every 4 (four) hours as needed for moderate pain. 10/23/21   Dwyane Dee, MD  pantoprazole (PROTONIX) 40 MG tablet Take 1 tablet (40 mg total) by mouth 2 (two) times daily before a meal. 10/23/21   Dwyane Dee, MD  sertraline (ZOLOFT) 50 MG tablet Take 100 mg by mouth daily.    [provider]  triamcinolone (NASACORT ALLERGY 24HR) 55 MCG/ACT AERO nasal inhaler Place 2 sprays into the nose daily as needed (sinus  congestion).    [provider]  valsartan (DIOVAN) 160 MG tablet Take 1 tablet (160 mg total) by mouth daily. 06/04/21   Elouise Munroe, MD  isosorbide mononitrate (IMDUR) 30 MG 24 hr tablet Take 1 tablet (30 mg total) by mouth daily. 11/25/19 12/06/19  Jettie Booze, MD    Physical Exam: Vitals:   10/24/21 1154 10/24/21 1803  BP: 118/66 120/65  Pulse: 79 70  Resp: 18 17  Temp: 98.2 F (36.8 C) 98 F (36.7 C)  TempSrc: Oral Oral  SpO2: 96% 97%    Physical Exam Constitutional:      General: She is not in acute distress.    Appearance: Normal appearance.  HENT:     Head: Normocephalic and atraumatic.     Mouth/Throat:     Mouth: Mucous membranes are moist.     Pharynx: Oropharynx is clear.  Eyes:     Extraocular Movements: Extraocular movements intact.     Pupils: Pupils are equal, round, and reactive to light.  Cardiovascular:     Rate and Rhythm: Normal rate and  regular rhythm.     Pulses: Normal pulses.     Heart sounds: Normal heart sounds.  Pulmonary:     Effort: Pulmonary effort is normal. No respiratory distress.     Breath sounds: Normal breath sounds.  Abdominal:     General: Bowel sounds are normal. There is no distension.     Palpations: Abdomen is soft.     Tenderness: There is no abdominal tenderness.  Musculoskeletal:        General: No swelling or deformity.     Comments: Splint in place RLE  Skin:    General: Skin is warm and dry.  Neurological:     General: No focal deficit present.     Mental Status: Mental status is at baseline.    Labs on Admission: I have personally reviewed following labs and imaging studies  CBC: Recent Labs  Lab 10/20/21 0359 10/20/21 1953 10/21/21 0412 10/21/21 1957 10/22/21 0341 10/22/21 1226 10/23/21 0758 10/24/21 2051  WBC 14.7*  --  11.4*  --  12.1*  --  10.3 11.4*  NEUTROABS 12.3*  --  7.7  --  8.9*  --  7.3 9.4*  HGB 8.6*   < > 7.5* 7.2* 6.7* 8.4* 8.2* 8.5*  HCT 26.9*   < > 23.8* 23.0*  21.3* 26.2* 26.1* 25.7*  MCV 93.4  --  96.7  --  96.8  --  95.6 93.1  PLT 276  --  259  --  250  --  259 317   < > = values in this interval not displayed.    Basic Metabolic Panel: Recent Labs  Lab 10/20/21 0359 10/21/21 0412 10/22/21 0341 10/23/21 0338 10/24/21 2051  NA 136 137 136 137 135  K 3.7 3.9 3.1* 3.6 3.6  CL 103 106 105 105 101  CO2 '24 26 26 26 22  '$ GLUCOSE 127* 118* 126* 109* 107*  BUN '18 12 8 '$ 7* 10  CREATININE 0.72 0.85 0.71 0.60 0.66  CALCIUM 9.2 8.2* 8.5* 8.5* 8.7*  MG  --  1.8 2.0 1.7  --     GFR: Estimated Creatinine Clearance: 61.3 mL/min (by C-G formula based on SCr of 0.66 mg/dL).  Liver Function Tests: Recent Labs  Lab 10/20/21 0359 10/24/21 2051  AST 24 21  ALT 19 17  ALKPHOS 75 64  BILITOT 0.6 1.0  PROT 6.8 5.4*  ALBUMIN 3.9 2.7*    Urine analysis:    Component Value Date/Time   COLORURINE YELLOW 10/24/2021 2155   APPEARANCEUR CLOUDY (A) 10/24/2021 2155   LABSPEC 1.013 10/24/2021 2155   PHURINE 6.0 10/24/2021 2155   GLUCOSEU NEGATIVE 10/24/2021 2155   HGBUR SMALL (A) 10/24/2021 2155   BILIRUBINUR NEGATIVE 10/24/2021 2155   KETONESUR 20 (A) 10/24/2021 2155   PROTEINUR 30 (A) 10/24/2021 2155   NITRITE NEGATIVE 10/24/2021 2155   LEUKOCYTESUR LARGE (A) 10/24/2021 2155    Radiological Exams on Admission: DG Ankle Complete Right  Result Date: 10/24/2021 CLINICAL DATA:  Status post trauma. EXAM: RIGHT ANKLE - COMPLETE 3+ VIEW COMPARISON:  October 20, 2021 FINDINGS: Acute, nondisplaced fractures are seen involving the right medial malleolus and right lateral malleolus. There is no evidence of dislocation. There is no evidence of arthropathy or other focal bone abnormality. Moderate severity diffuse soft tissue swelling is noted. IMPRESSION: Acute fractures of the right medial malleolus and right lateral malleolus. Electronically Signed   By: Virgina Norfolk M.D.   On: 10/24/2021 20:24    EKG: Not performed in the emergency  department.  Assessment/Plan Principal Problem:   Ankle fracture, bimalleolar, closed Active Problems:   IDA (iron deficiency anemia)   CAD (coronary artery disease)   Hypertension   Hyperlipidemia   S/P CABG x 6   Atrial fibrillation (HCC)   UTI (urinary tract infection)   Bimalleolar ankle fracture > Patient presenting with worsening pain and decreased mobility in the setting of ankle fracture. > This was noted during previous admission and due to decreased mobility PT recommended SNF but patient declined. > Has had worsening pain and decreased mobility since discharging home yesterday.  X-ray today showed both medial and lateral malleoli are fracture and previous x-rays appear to show only medial malleoli fracture. > It appears orthopedics follow-up as already been arranged. > Patient is now agreeable for SNF stay. - Monitor on MedSurg - PT OT eval and treat - Pain control as needed  UTI > Noted to have evidence of UTI with leukocytes, bacteria, hemoglobin and urine ED.  Received dose IV antibiotics.  Did have mild leukocytosis 11.4 however this appears to be somewhat chronic. - Likely we will switch to p.o. antibiotics tomorrow - Trend fever curve and WBC.  Anemia > Hemoglobin stable 8.5 - Continue to trend CBC  Recent GI bleed > Hemoglobin remained stable as above. - Continue home PPI  CAD > Hx of CABG x6 - Continue Home Carvedilol - Replace Home Valsartan with Formulary Irbesartan - Continue Home Atorvastatin and Plavix  Hypertension - Continue home amlodipine - Continue home Coreg - Replace home valsartan formulary irbesartan  Hyperlipidemia - History of CVA - Continue home atorvastatin, Plavix  Postherpetic neuralgia - Continue home amitriptyline and gabapentin  DVT prophylaxis: SCDs for now given recent GI bleeding Code Status:   Full Family Communication:  Updated at Bedside  Disposition Plan:   Patient is from:  Home  Anticipated DC  to:  SNF  Anticipated DC date:  Pending placement  Anticipated DC barriers: Pending placement  Consults called:  None Admission status:  Observation, MedSurg  Severity of Illness: The appropriate patient status for this patient is OBSERVATION. Observation status is judged to be reasonable and necessary in order to provide the required intensity of service to ensure the patient's safety. The patient's presenting symptoms, physical exam findings, and initial radiographic and laboratory data in the context of their medical condition is felt to place them at decreased risk for further clinical deterioration. Furthermore, it is anticipated that the patient will be medically stable for discharge from the hospital within 2 midnights of admission.    Marcelyn Bruins MD [facility]  How to contact the [facility] Attending or Consulting provider [place] or covering provider during after hours Kirk Basquez, for this patient?   Check the care team in [facility] and look for a) attending/consulting [facility] provider listed and b) the [facility] team listed Log into [email or web address] and use [place]'s universal password to access. If you do not have the password, please contact the hospital operator. Locate the [facility] provider you are looking for under [facility] and page to a number that you can be directly reached. If you still have difficulty reaching the provider, please page the [facility] (Director on Call) for the Hospitalists listed on amion for assistance.  10/24/2021, 10:36 PM

## 2021-10-25 DIAGNOSIS — S82841D Displaced bimalleolar fracture of right lower leg, subsequent encounter for closed fracture with routine healing: Secondary | ICD-10-CM | POA: Diagnosis not present

## 2021-10-25 LAB — COMPREHENSIVE METABOLIC PANEL
ALT: 15 U/L (ref 0–44)
AST: 15 U/L (ref 15–41)
Albumin: 2.4 g/dL — ABNORMAL LOW (ref 3.5–5.0)
Alkaline Phosphatase: 61 U/L (ref 38–126)
Anion gap: 9 (ref 5–15)
BUN: 9 mg/dL (ref 8–23)
CO2: 26 mmol/L (ref 22–32)
Calcium: 8.4 mg/dL — ABNORMAL LOW (ref 8.9–10.3)
Chloride: 101 mmol/L (ref 98–111)
Creatinine, Ser: 0.66 mg/dL (ref 0.44–1.00)
GFR, Estimated: >60 mL/min (ref >60)
Glucose, Bld: 99 mg/dL (ref 70–99)
Potassium: 2.9 mmol/L — ABNORMAL LOW (ref 3.5–5.1)
Sodium: 136 mmol/L (ref 135–145)
Total Bilirubin: 0.8 mg/dL (ref 0.3–1.2)
Total Protein: 5.2 g/dL — ABNORMAL LOW (ref 6.5–8.1)

## 2021-10-25 LAB — CBC
HCT: 24.9 % — ABNORMAL LOW (ref 36.0–46.0)
Hemoglobin: 8.2 g/dL — ABNORMAL LOW (ref 12.0–15.0)
MCH: 30 pg (ref 26.0–34.0)
MCHC: 32.9 g/dL (ref 30.0–36.0)
MCV: 91.2 fL (ref 80.0–100.0)
Platelets: 337 10*3/uL (ref 150–400)
RBC: 2.73 MIL/uL — ABNORMAL LOW (ref 3.87–5.11)
RDW: 15.1 % (ref 11.5–15.5)
WBC: 10.8 10*3/uL — ABNORMAL HIGH (ref 4.0–10.5)
nRBC: 0 % (ref 0.0–0.2)

## 2021-10-25 LAB — MAGNESIUM: Magnesium: 1.8 mg/dL (ref 1.7–2.4)

## 2021-10-25 MED ORDER — POTASSIUM CHLORIDE CRYS ER 20 MEQ PO TBCR
40.0000 meq | EXTENDED_RELEASE_TABLET | ORAL | Status: AC
  Administered 2021-10-25 (×2): 40 meq via ORAL
  Filled 2021-10-25 (×2): qty 2

## 2021-10-25 MED ORDER — SODIUM CHLORIDE 0.9 % IV SOLN
1.0000 g | INTRAVENOUS | Status: DC
  Administered 2021-10-25: 1 g via INTRAVENOUS
  Filled 2021-10-25 (×2): qty 10

## 2021-10-25 NOTE — Progress Notes (Signed)
pt refused rocephin VIA IV and is requesting her abx po , her iv site was infiltrated and I removed it. I attempted to replace IV site and verbalized that she is getting tired of getting stung by needles and will not allow IV team. I educated pt on the advantage of having  IV vs PO meds, she did not seem to care. Provider notified via [facility]

## 2021-10-25 NOTE — Plan of Care (Signed)

## 2021-10-25 NOTE — Plan of Care (Signed)

## 2021-10-25 NOTE — NC FL2 (Signed)
[place] LEVEL OF CARE SCREENING TOOL     IDENTIFICATION  Patient Name: Jamie Hodge Birthdate: 1940-10-20 Sex: female Admission Date (Current Location): 10/24/2021  [facility] and [place] Number:  Herbalist and Address:  [facility] [place] [place] [address] [place], [place] [address]      Provider Number: [id number]  Attending Physician Name and Address:  Shelly Coss, MD  Relative Name and Phone Number:  All,Carl Spouse [phone]  [phone]    Current Level of Care: Hospital Recommended Level of Care: [place] Prior Approval Number:    Date Approved/Denied:   PASRR Number: [id number]  Discharge Plan: SNF    Current Diagnoses: Patient Active Problem List   Diagnosis Date Noted   UTI (urinary tract infection) 10/24/2021   Ankle fracture, bimalleolar, closed 10/24/2021   GIB (gastrointestinal bleeding) 10/21/2021   Hematemesis 10/20/2021   Closed right ankle fracture 10/20/2021   IDA (iron deficiency anemia) 10/20/2021   Atrial fibrillation ([place]) 12/07/2019   S/P CABG x 6 11/29/2019   Hyperlipidemia 11/24/2019   Hypertension 11/24/2019   CAD (coronary artery disease) 11/23/2019   Postherpetic neuralgia 09/03/2010    Orientation RESPIRATION BLADDER Height & Weight     Self, Time, Situation, Place  Normal Continent Weight:   Height:     BEHAVIORAL SYMPTOMS/MOOD NEUROLOGICAL BOWEL NUTRITION STATUS      Continent Diet (see discharge summary)  AMBULATORY STATUS COMMUNICATION OF NEEDS Skin   Limited Assist Verbally Normal                       Personal Care Assistance Level of Assistance  Bathing, Feeding, Dressing Bathing Assistance: Limited assistance Feeding assistance: Limited assistance Dressing Assistance: Limited assistance     Functional Limitations Info  Sight, Hearing, Speech Sight Info: Adequate Hearing Info: Adequate Speech Info: Adequate    SPECIAL CARE FACTORS  FREQUENCY  PT (By licensed PT), OT (By licensed OT)     PT Frequency: 5x week OT Frequency: 5x week            Contractures Contractures Info: Not present    Additional Factors Info  Code Status, Allergies Code Status Info: full Allergies Info: Mango Flavor, Contrast Media (Iodinated Contrast Media)           Current Medications (10/25/2021):  This is the current hospital active medication list Current Facility-Administered Medications  Medication Dose Route Frequency Provider Last Rate Last Admin   acetaminophen (TYLENOL) tablet 650 mg  650 mg Oral Q6H PRN Marcelyn Bruins, MD       Or   acetaminophen (TYLENOL) suppository 650 mg  650 mg Rectal Q6H PRN Marcelyn Bruins, MD       amitriptyline (ELAVIL) tablet 10 mg  10 mg Oral QHS Marcelyn Bruins, MD   10 mg at 10/25/21 0027   amLODipine (NORVASC) tablet 2.5 mg  2.5 mg Oral Daily Marcelyn Bruins, MD   2.5 mg at 10/25/21 0845   atorvastatin (LIPITOR) tablet 80 mg  80 mg Oral Daily Marcelyn Bruins, MD   80 mg at 10/25/21 0844   carvedilol (COREG) tablet 6.25 mg  6.25 mg Oral BID WC Marcelyn Bruins, MD   6.25 mg at 10/25/21 0845   cefTRIAXone (ROCEPHIN) 1 g in sodium chloride 0.9 % 100 mL IVPB  1 g Intravenous Q24H Shelly Coss, MD 200 mL/hr at 10/25/21 0943 1 g at 10/25/21 0943   clopidogrel (PLAVIX)  tablet 75 mg  75 mg Oral Daily Marcelyn Bruins, MD   75 mg at 10/25/21 0845   gabapentin (NEURONTIN) capsule 900 mg  900 mg Oral QHS Marcelyn Bruins, MD   900 mg at 10/25/21 0027   irbesartan (AVAPRO) tablet 150 mg  150 mg Oral Daily Marcelyn Bruins, MD   150 mg at 10/25/21 0844   oxyCODONE (Oxy IR/ROXICODONE) immediate release tablet 5 mg  5 mg Oral Q4H PRN Marcelyn Bruins, MD       pantoprazole (PROTONIX) EC tablet 40 mg  40 mg Oral BID AC Marcelyn Bruins, MD   40 mg at 10/25/21 0844   polyethylene glycol (MIRALAX / GLYCOLAX) packet 17 g  17 g Oral Daily PRN Marcelyn Bruins, MD        sertraline (ZOLOFT) tablet 100 mg  100 mg Oral Daily Marcelyn Bruins, MD   100 mg at 10/25/21 0844   sodium chloride flush (NS) 0.9 % injection 3 mL  3 mL Intravenous Q12H Marcelyn Bruins, MD   3 mL at 10/25/21 0845     Discharge Medications: Please see discharge summary for a list of discharge medications.  Relevant Imaging Results:  Relevant Lab Results:   Additional Information SSN: [id number].  Pt is vaccinated for covid with multiple boosters.  Joanne Chars, LCSW

## 2021-10-25 NOTE — Care Management Obs Status (Signed)
[place] NOTIFICATION   Patient Details  Name: Jamie Hodge MRN: [id number] Date of Birth: Sep 10, 1940   Medicare Observation Status Notification Given:  Yes    Joanne Chars, LCSW 10/25/2021, 3:29 PM

## 2021-10-25 NOTE — Progress Notes (Addendum)
PROGRESS NOTE  Jamie Hodge  NIO:[id number] DOB: 03-Jan-1941 DOA: 10/24/2021 PCP: [facility]   Brief Narrative: Patient is a 81 year old female with history of anemia, coronary artery disease, status post CABG, hypertension, hyperlipidemia, paroxysmal A-fib, CVA, postoperative neuralgia, GI bleed who presented with complaint of decreased ability, ankle pain.  She was recently admitted here from 9/30 and was discharged on 10/3 after she was managed for upper GI bleed, EGD showing gastric ulcer.  Also found to have right medial malleoli fracture that occurred during this syncopal event to patient's anemia.  She was placed in a splint with plan for conversion to cast versus boot as outpatient.  She was recommended to go to SNF but she preferred to go home and came back with decreased mobility, significant pain on the right ankle, unable to take care of herself.  On presentation, she was hemodynamically stable.  Lab work showed mild leukocytosis of 11.4, hemoglobin of 8.5, UA showed large leukocytes, bacteria.  Started on ceftriaxone for possible UTI.  Right ankle x-ray showed acute right medial and lateral malleoli fracture.  PT/OT consulted, TOC consulted for SNF placement  Assessment & Plan:  Principal Problem:   Ankle fracture, bimalleolar, closed Active Problems:   IDA (iron deficiency anemia)   CAD (coronary artery disease)   Hypertension   Hyperlipidemia   S/P CABG x 6   Atrial fibrillation (HCC)   UTI (urinary tract infection)   Right ankle fracture: Was managed conservatively on last admission and the plan is for follow-up with orthopedics for conversion to cast versus CAM boot.  Plan to follow-up with Dr. Percell Miller in a week.  Presented with significant pain, problem with ambulation.  Initially recommended SNF but declined and went home.  Now admitted with plan for SNF, TOC following.  OT saw her today and recommended home health again.  We will follow-up with  PT note. Continue pain management, supportive care  Hypokalemia: Currently being supplemented.  Suspected UTI: UA showed a lot of leukocytes, bacteria.  Lab work showed leukocytosis. Currently on ceftriaxone.  Follow-up urine culture.  Denies any dysuria  Normocytic anemia: Currently hemoglobin stable.  Monitor  Recent GI bleed: Underwent EGD on last admission, showing gastric ulcer.  Resolved.  Continue PPI  Coronary heart disease: History of CABG.  On Plavix, Lipitor, ARB, carvedilol  Paroxysmal A-fib: Currently in normal sinus rhythm.Not on anticoagulation  Hypertension: Monitor blood pressure.  Continue amlodipine, Coreg  HLD: Lipitor, Plavix  Postherpetic neuralgia: Continue amitriptyline, gabapentin  Obesity: BMI of 33.3           DVT prophylaxis:SCDs Start: 10/24/21 2234     Code Status: Full Code  Family Communication: None at bedside  Patient status:Obs  Patient is from :Home  Anticipated discharge to:SnF vs Home  Estimated DC date:1-2 days   Consultants: None  Procedures:None  Antimicrobials:  Anti-infectives (From admission, onward)    Start     Dose/Rate Route Frequency Ordered Stop   10/24/21 2230  cefTRIAXone (ROCEPHIN) 1 g in sodium chloride 0.9 % 100 mL IVPB        1 g 200 mL/hr over 30 Minutes Intravenous  Once 10/24/21 2229 10/25/21 0028       Subjective: Patient seen and examined at the bedside today.  Hemodynamically stable.  Comfortable sitting in the chair.  Denies new complaints.  Seen by OT and PT. she was anticipating discharge to SNF  Objective: Vitals:   10/24/21 1154 10/24/21 1803 10/25/21 0222 10/25/21  0449  BP: 118/66 120/65 (!) 124/58 (!) 115/47  Pulse: 79 70 90 84  Resp: '18 17 18 18  '$ Temp: 98.2 F (36.8 C) 98 F (36.7 C)  98.7 F (37.1 C)  TempSrc: Oral Oral    SpO2: 96% 97% 95% 90%    Intake/Output Summary (Last 24 hours) at 10/25/2021 0744 Last data filed at 10/25/2021 0028 Gross per 24 hour  Intake 600  ml  Output --  Net 600 ml   There were no vitals filed for this visit.  Examination:  General exam: Overall comfortable, not in distress,obese HEENT: PERRL Respiratory system:  no wheezes or crackles  Cardiovascular system: S1 & S2 heard, RRR.  Gastrointestinal system: Abdomen is nondistended, soft and nontender. Central nervous system: Alert and oriented Extremities: No edema, no clubbing ,no cyanosis, splint on the right lower extremity Skin: No rashes, no ulcers,no icterus     Data Reviewed: I have personally reviewed following labs and imaging studies  CBC: Recent Labs  Lab 10/20/21 0359 10/20/21 1953 10/21/21 0412 10/21/21 1957 10/22/21 0341 10/22/21 1226 10/23/21 0758 10/24/21 2051 10/25/21 0301  WBC 14.7*  --  11.4*  --  12.1*  --  10.3 11.4* 10.8*  NEUTROABS 12.3*  --  7.7  --  8.9*  --  7.3 9.4*  --   HGB 8.6*   < > 7.5*   < > 6.7* 8.4* 8.2* 8.5* 8.2*  HCT 26.9*   < > 23.8*   < > 21.3* 26.2* 26.1* 25.7* 24.9*  MCV 93.4  --  96.7  --  96.8  --  95.6 93.1 91.2  PLT 276  --  259  --  250  --  259 317 337   < > = values in this interval not displayed.   Basic Metabolic Panel: Recent Labs  Lab 10/21/21 0412 10/22/21 0341 10/23/21 0338 10/24/21 2051 10/25/21 0301  NA 137 136 137 135 136  K 3.9 3.1* 3.6 3.6 2.9*  CL 106 105 105 101 101  CO2 '26 26 26 22 26  '$ GLUCOSE 118* 126* 109* 107* 99  BUN 12 8 7* 10 9  CREATININE 0.85 0.71 0.60 0.66 0.66  CALCIUM 8.2* 8.5* 8.5* 8.7* 8.4*  MG 1.8 2.0 1.7  --   --      Recent Results (from the past 240 hour(s))  Surgical pcr screen     Status: Abnormal   Collection Time: 10/20/21  5:29 PM   Specimen: Nasal Mucosa; Nasal Swab  Result Value Ref Range Status   MRSA, PCR NEGATIVE NEGATIVE Final   Staphylococcus aureus POSITIVE (A) NEGATIVE Final    Comment: (NOTE) The Xpert SA Assay (FDA approved for NASAL specimens in patients 92 years of age and older), is one component of a comprehensive surveillance program. It  is not intended to diagnose infection nor to guide or monitor treatment. Performed at [facility], [place] [address] [place], [place] [address]      Radiology Studies: DG Ankle Complete Right  Result Date: 10/24/2021 CLINICAL DATA:  Status post trauma. EXAM: RIGHT ANKLE - COMPLETE 3+ VIEW COMPARISON:  October 20, 2021 FINDINGS: Acute, nondisplaced fractures are seen involving the right medial malleolus and right lateral malleolus. There is no evidence of dislocation. There is no evidence of arthropathy or other focal bone abnormality. Moderate severity diffuse soft tissue swelling is noted. IMPRESSION: Acute fractures of the right medial malleolus and right lateral malleolus. Electronically Signed   By: Joyce Gross.D.  On: 10/24/2021 20:24    Scheduled Meds:  amitriptyline  10 mg Oral QHS   amLODipine  2.5 mg Oral Daily   atorvastatin  80 mg Oral Daily   carvedilol  6.25 mg Oral BID WC   clopidogrel  75 mg Oral Daily   gabapentin  900 mg Oral QHS   irbesartan  150 mg Oral Daily   pantoprazole  40 mg Oral BID AC   sertraline  100 mg Oral Daily   sodium chloride flush  3 mL Intravenous Q12H   Continuous Infusions:   LOS: 0 days   Shelly Coss, MD [facility] P10/05/2021, 7:44 AM

## 2021-10-25 NOTE — Progress Notes (Signed)
Occupational Therapy Treatment Patient Details Name: Jamie Hodge MRN: [id number] DOB: January 22, 1940 Today's Date: 10/25/2021   History of present illness 81 yo female presenting to ED on 10/4 with generalized weakness. Recent admission on 9/30 - 10/3 for R ankle fx and GI bleed (EDG on 10/2). Ortho consulted last admission regarding R ankle and recommended no surgical intervention/immobilization in splint for now and NWB. PMH including coronary artery disease s/p CABG in 2021, CVA in 2021, vertigo, HTN.   OT comments  Called back to room by RN to assist with transfer to [facility] as pt reporting she does not recall anything from therapy session prior. Pt requiring verbal cues for sequencing of stand pivot to [facility] Min A for balance and use of RW. Able to maintain WB status. Discussing dc recommendation and pt reporting she does not feel safe for dc to home. Update dc recommendations to SNF for post-acute rehab and will continue to follow acutely as admitted.    Recommendations for follow up therapy are one component of a multi-disciplinary discharge planning process, led by the attending physician.  Recommendations may be updated based on patient status, additional functional criteria and insurance authorization.    Follow Up Recommendations  Skilled nursing-short term rehab (<3 hours/day)    Assistance Recommended at Discharge Intermittent Supervision/Assistance  Patient can return home with the following  A little help with walking and/or transfers;A lot of help with bathing/dressing/bathroom;Assistance with cooking/housework;Assist for transportation;Help with stairs or ramp for entrance   Equipment Recommendations  Wheelchair (measurements OT);Wheelchair cushion (measurements OT)    Recommendations for Other Services PT consult    Precautions / Restrictions Precautions Precautions: Fall Restrictions Weight Bearing Restrictions: Yes RLE Weight Bearing: Non weight bearing        Mobility Bed Mobility Overal bed mobility: Needs Assistance Bed Mobility: Supine to Sit     Supine to sit: Min assist     General bed mobility comments: In recliner    Transfers Overall transfer level: Needs assistance Equipment used: Rolling walker (2 wheels), 1 person hand held assist Transfers: Bed to chair/wheelchair/BSC Sit to Stand: Min assist Stand pivot transfers: Min assist         General transfer comment: Min A for gaining balance in standing and then maintaining balance during pivot to recliner. Cues for hand placement and RW management     Balance Overall balance assessment: History of Falls Sitting-balance support: No upper extremity supported, Feet supported Sitting balance-Leahy Scale: Fair     Standing balance support: Bilateral upper extremity supported, During functional activity Standing balance-Leahy Scale: Poor Standing balance comment: Requiring UE support to maintain balance                           ADL either performed or assessed with clinical judgement   ADL Overall ADL's : Needs assistance/impaired Eating/Feeding: Set up;Sitting   Grooming: Set up;Sitting;Wash/dry face;Oral care   Upper Body Bathing: Sitting;Set up   Lower Body Bathing: Minimal assistance;Sit to/from stand   Upper Body Dressing : Set up;Sitting   Lower Body Dressing: Sit to/from stand;Moderate assistance   Toilet Transfer: Minimal assistance;Stand-pivot;Rolling walker (2 wheels) (simulated to recliner) Toilet Transfer Details (indicate cue type and reason): Pt unable to recall sequence from earlier. However, still bale to perform stand pivot with Min A and demonstrating good adherance to NWB status. Toileting- Water quality scientist and Hygiene: Sit to/from stand;Moderate assistance       Functional mobility during ADLs: Minimal  assistance;Cueing for safety;Rolling walker (2 wheels) (stand pivot/hop) General ADL Comments: Feel pt continues to  demonstrate ability to perform transfer with Min A. However, when re-discussing dc recommendation pt reportign she wants to go to rehab because she does not feel safe at home    Extremity/Trunk Assessment Upper Extremity Assessment Upper Extremity Assessment: Overall WFL for tasks assessed   Lower Extremity Assessment Lower Extremity Assessment: Defer to PT evaluation        Vision Baseline Vision/History: 1 Wears glasses     Perception     Praxis      Cognition Arousal/Alertness: Awake/alert Behavior During Therapy: WFL for tasks assessed/performed Overall Cognitive Status: Impaired/Different from baseline Area of Impairment: Memory                     Memory: Decreased short-term memory         General Comments: Difficulty recalling the sequence of steps used for transfer to reclienr earlier. Abel to recall and problem solve 75% of the sequence with increased time        Exercises      Shoulder Instructions       General Comments RN present and requesting assistance from therapist to transfer to toilet - repoting pt doesnt recall OT session from earlier.    Pertinent Vitals/ Pain       Pain Assessment Pain Assessment: Faces Pain Score: 5  Faces Pain Scale: Hurts little more Pain Location: right ankle Pain Descriptors / Indicators:  (When asked to describe, pt states "non life threatening") Pain Intervention(s): Monitored during session, Limited activity within patient's tolerance, Repositioned  Home Living Family/patient expects to be discharged to:: Private residence Living Arrangements: Spouse/significant other Available Help at Discharge: Family;Available 24 hours/day Type of Home: House Home Access: Stairs to enter [employer] of Steps: 2 Entrance Stairs-Rails: Left Home Layout: One level     Bathroom Shower/Tub: Occupational psychologist: Handicapped height Bathroom Accessibility: Yes   Home Equipment: Conservation officer, nature  (2 wheels);Cane - single point;Crutches;BSC/3in1;Shower seat          Prior Functioning/Environment              Frequency  Min 3X/week        Progress Toward Goals  OT Goals(current goals can now be found in the care plan section)  Progress towards OT goals: Progressing toward goals  Acute Rehab OT Goals Patient Stated Goal: Get better then go home OT Goal Formulation: With patient Time For Goal Achievement: 11/08/21 Potential to Achieve Goals: Good ADL Goals Pt Will Perform Lower Body Dressing: with supervision;sit to/from stand Pt Will Transfer to Toilet: with supervision;bedside commode;stand pivot transfer Pt Will Perform Toileting - Clothing Manipulation and hygiene: with supervision;sit to/from stand Additional ADL Goal #1: Pt will perform bed mobility at Mod I level with increased time in preparation for ADLs  Plan Discharge plan needs to be updated    Co-evaluation                 AM-PAC OT "6 Clicks" Daily Activity     Outcome Measure   Help from another person eating meals?: A Little Help from another person taking care of personal grooming?: A Little Help from another person toileting, which includes using toliet, bedpan, or urinal?: A Lot Help from another person bathing (including washing, rinsing, drying)?: A Little Help from another person to put on and taking off regular upper body clothing?: A Little Help from another person  to put on and taking off regular lower body clothing?: A Lot 6 Click Score: 16    End of Session Equipment Utilized During Treatment: Rolling walker (2 wheels)  OT Visit Diagnosis: Unsteadiness on feet (R26.81);Other abnormalities of gait and mobility (R26.89);Muscle weakness (generalized) (M62.81);History of falling (Z91.81);Pain   Activity Tolerance Patient tolerated treatment well   Patient Left with call bell/phone within reach;Other (comment) (BSC with RN)   Nurse Communication Mobility status         Time: [phone] OT Time Calculation (min): 8 min  Charges: OT General Charges $OT Visit: 1 Visit OT Evaluation $OT Eval Low Complexity: 1 Low OT Treatments $Self Care/Home Management : 8-22 mins  Oluwatimilehin Balfour MSOT, OTR/L Acute Rehab Office: [place] 10/25/2021, 11:38 AM

## 2021-10-25 NOTE — Evaluation (Signed)
Physical Therapy Evaluation Patient Details Name: Jamie Hodge MRN: [id number] DOB: [birth year] Today's Date: 10/25/2021  History of Present Illness  81 yo female presenting to ED on 10/4 with generalized weakness. Recent admission on 9/30 - 10/3 for R ankle fx and GI bleed (EDG on 10/2). Ortho consulted last admission regarding R ankle and recommended no surgical intervention/immobilization in splint for now and NWB. PMH including coronary artery disease s/p CABG in 2021, CVA in 2021, vertigo, HTN.  Clinical Impression  Pt was seen for PT to get up to walk and maneuver a bit, with difficulty due to weakness on LLE and her limited follow through on gait tasks.  Pt is demonstrating some repetitive instruction needs, and is expressing concern that her husband cannot help her.  Her decision to sit down at home without understanding how to manage from there is a concern that she is not ready to be in such an independent situation.  Recommend SNF care to get her strength back on LLE, to increase balance with NWB on LLE, and to instruct her on safe limits of mobility so her choices for movement will be more protective of her current condition.  Follow acutely for same needs.     Recommendations for follow up therapy are one component of a multi-disciplinary discharge planning process, led by the attending physician.  Recommendations may be updated based on patient status, additional functional criteria and insurance authorization.  Follow Up Recommendations Skilled nursing-short term rehab (<3 hours/day) Can patient physically be transported by private vehicle: No    Assistance Recommended at Discharge Frequent or constant Supervision/Assistance  Patient can return home with the following  A little help with walking and/or transfers;A little help with bathing/dressing/bathroom;Assistance with cooking/housework;Assist for transportation;Help with stairs or ramp for entrance    Equipment  Recommendations Wheelchair (measurements PT);Wheelchair cushion (measurements PT)  Recommendations for Other Services       Functional Status Assessment Patient has had a recent decline in their functional status and demonstrates the ability to make significant improvements in function in a reasonable and predictable amount of time.     Precautions / Restrictions Precautions Precautions: Fall Required Braces or Orthoses: Splint/Cast Splint/Cast: R ankle splint Splint/Cast - Date Prophylactic Dressing Applied (if applicable): 16/94/50 Restrictions Weight Bearing Restrictions: Yes RLE Weight Bearing: Non weight bearing      Mobility  Bed Mobility               General bed mobility comments: up in chair    Transfers Overall transfer level: Needs assistance Equipment used: Rolling walker (2 wheels), 1 person hand held assist Transfers: Sit to/from Stand Sit to Stand: Min assist                Ambulation/Gait Ambulation/Gait assistance: Min guard, Min assist Gait Distance (Feet): 8 Feet Assistive device: Rolling walker (2 wheels), 1 person hand held assist   Gait velocity: reduced     General Gait Details: hopping on LLE with difficulty but can just maintain NWB  Stairs            Wheelchair Mobility    Modified Rankin (Stroke Patients Only)       Balance Overall balance assessment: History of Falls Sitting-balance support: No upper extremity supported, Feet supported Sitting balance-Leahy Scale: Fair     Standing balance support: Bilateral upper extremity supported, During functional activity Standing balance-Leahy Scale: Poor Standing balance comment: Requiring UE support to maintain balance  Pertinent Vitals/Pain Pain Assessment Pain Assessment: Faces Faces Pain Scale: Hurts little more Pain Location: right ankle Pain Descriptors / Indicators: Guarding, Grimacing Pain Intervention(s): Limited  activity within patient's tolerance, Monitored during session, Premedicated before session, Repositioned    Home Living Family/patient expects to be discharged to:: Skilled nursing facility Living Arrangements: Spouse/significant other Available Help at Discharge: Family;Available 24 hours/day Type of Home: House Home Access: Stairs to enter Entrance Stairs-Rails: Left Entrance Stairs-Number of Steps: 2   Home Layout: One level Home Equipment: Conservation officer, nature (2 wheels);Cane - single point;Crutches;BSC/3in1;Shower seat Additional Comments: no family confirmation of extended family to assist her and with husband    Prior Function Prior Level of Function : Independent/Modified Independent             Mobility Comments: PLOF without AD       Hand Dominance   Dominant Hand: Left    Extremity/Trunk Assessment   Upper Extremity Assessment Upper Extremity Assessment: Defer to OT evaluation    Lower Extremity Assessment Lower Extremity Assessment: LLE deficits/detail RLE Deficits / Details: splint on R foot in PF RLE: Unable to fully assess due to immobilization LLE Deficits / Details: 4+ strength       Communication   Communication: No difficulties  Cognition Arousal/Alertness: Awake/alert Behavior During Therapy: WFL for tasks assessed/performed Overall Cognitive Status: Impaired/Different from baseline Area of Impairment: Problem solving, Attention                     Memory: Decreased short-term memory, Decreased recall of precautions Following Commands: Follows one step commands with increased time   Awareness: Intellectual Problem Solving: Slow processing, Requires verbal cues General Comments: some repetition and practice of transfers needed        General Comments General comments (skin integrity, edema, etc.): pt was seen for transfer and gait training, with help to reposition RLE to elevate and discussed her length of time to allow WB.  Pt is not  familiar with the expectations for recovery    Exercises     Assessment/Plan    PT Assessment Patient needs continued PT services  PT Problem List Decreased strength;Decreased balance;Decreased coordination;Decreased knowledge of use of DME;Decreased skin integrity;Pain       PT Treatment Interventions DME instruction;Therapeutic exercise;Gait training;Functional mobility training;Therapeutic activities;Patient/family education    PT Goals (Current goals can be found in the Care Plan section)  Acute Rehab PT Goals Patient Stated Goal: home PT Goal Formulation: With patient Time For Goal Achievement: 11/08/21 Potential to Achieve Goals: Good    Frequency Min 3X/week     Co-evaluation               AM-PAC PT "6 Clicks" Mobility  Outcome Measure Help needed turning from your back to your side while in a flat bed without using bedrails?: A Little Help needed moving from lying on your back to sitting on the side of a flat bed without using bedrails?: A Little Help needed moving to and from a bed to a chair (including a wheelchair)?: A Little Help needed standing up from a chair using your arms (e.g., wheelchair or bedside chair)?: A Little Help needed to walk in hospital room?: A Little Help needed climbing 3-5 steps with a railing? : Total 6 Click Score: 16    End of Session Equipment Utilized During Treatment: Gait belt Activity Tolerance: Patient limited by fatigue;Treatment limited secondary to medical complications (Comment) Patient left: in chair;with call bell/phone within reach Nurse Communication:  Mobility status PT Visit Diagnosis: Other abnormalities of gait and mobility (R26.89);Difficulty in walking, not elsewhere classified (R26.2)    Time: 8119-1478 PT Time Calculation (min) (ACUTE ONLY): 24 min   Charges:   PT Evaluation $PT Eval Moderate Complexity: 1 Mod PT Treatments $Gait Training: 8-22 mins       Ramond Dial 10/25/2021, 12:57 PM  Mee Hives, PT PhD Acute Rehab Dept. Number: [place] and [place]

## 2021-10-25 NOTE — Evaluation (Signed)
Occupational Therapy Evaluation Patient Details Name: Jamie Hodge MRN: [id number] DOB: [birth year] Today's Date: 10/25/2021   History of Present Illness 81 yo female presenting to ED on 10/4 with generalized weakness. Recent admission on 9/30 - 10/3 for R ankle fx and GI bleed (EDG on 10/2). Ortho consulted last admission regarding R ankle and recommended no surgical intervention/immobilization in splint for now and NWB. PMH including coronary artery disease s/p CABG in 2021, CVA in 2021, vertigo, HTN.   Clinical Impression   PTA, pt was living with her husband and was independent prior to fall. Currently, pt requires Min-Mod A for LB ADLs and Min A for functional transfers using RW. Pt presenting with decreased strength, balance, and activity tolerance. However, highly motivated and able to maintain WB status. Reporting she is feeling better compared to last few days. Pt would benefit from further acute OT to facilitate safe dc. Recommend dc to home with HHOT for further OT to optimize safety, independence with ADLs, and return to PLOF.    Recommendations for follow up therapy are one component of a multi-disciplinary discharge planning process, led by the attending physician.  Recommendations may be updated based on patient status, additional functional criteria and insurance authorization.   Follow Up Recommendations  Home health OT    Assistance Recommended at Discharge Intermittent Supervision/Assistance  Patient can return home with the following A little help with walking and/or transfers;A lot of help with bathing/dressing/bathroom;Assistance with cooking/housework;Assist for transportation;Help with stairs or ramp for entrance    Functional Status Assessment  Patient has had a recent decline in their functional status and demonstrates the ability to make significant improvements in function in a reasonable and predictable amount of time.  Equipment Recommendations  Wheelchair  (measurements OT);Wheelchair cushion (measurements OT)    Recommendations for Other Services PT consult     Precautions / Restrictions Precautions Precautions: Fall Restrictions Weight Bearing Restrictions: Yes RLE Weight Bearing: Non weight bearing      Mobility Bed Mobility Overal bed mobility: Needs Assistance Bed Mobility: Supine to Sit     Supine to sit: Min assist     General bed mobility comments: MIn A for facilitating RLE over EOB and to pull hips with bed pad    Transfers Overall transfer level: Needs assistance Equipment used: Rolling walker (2 wheels), 1 person hand held assist Transfers: Bed to chair/wheelchair/BSC Sit to Stand: Min assist Stand pivot transfers: Min assist         General transfer comment: Min A for gaining balance in standing and then maintaining balance during pivot to recliner. Cues for hand placement and RW management      Balance Overall balance assessment: History of Falls Sitting-balance support: No upper extremity supported, Feet supported Sitting balance-Leahy Scale: Fair     Standing balance support: Bilateral upper extremity supported, During functional activity Standing balance-Leahy Scale: Poor Standing balance comment: Requiring UE support to maintain balance                           ADL either performed or assessed with clinical judgement   ADL Overall ADL's : Needs assistance/impaired Eating/Feeding: Set up;Sitting   Grooming: Set up;Sitting;Wash/dry face;Oral care   Upper Body Bathing: Sitting;Set up   Lower Body Bathing: Minimal assistance;Sit to/from stand   Upper Body Dressing : Set up;Sitting   Lower Body Dressing: Sit to/from stand;Moderate assistance   Toilet Transfer: Minimal assistance;Stand-pivot;Rolling walker (2 wheels) (simulated to recliner)  Toileting- Water quality scientist and Hygiene: Sit to/from stand;Moderate assistance       Functional mobility during ADLs: Minimal  assistance;Cueing for safety;Rolling walker (2 wheels) (stand pivot/hop) General ADL Comments: Pt demonstrating decreased strength, balance, and activity tolerance. However, very motivated and demonstrating good WB adherance.     Vision Baseline Vision/History: 1 Wears glasses       Perception     Praxis      Pertinent Vitals/Pain Pain Assessment Pain Assessment: 0-10 Pain Score: 5  Pain Location: right ankle Pain Descriptors / Indicators:  (When asked to describe, pt states "non life threatening") Pain Intervention(s): Monitored during session, Limited activity within patient's tolerance, Repositioned     Hand Dominance     Extremity/Trunk Assessment Upper Extremity Assessment Upper Extremity Assessment: Overall WFL for tasks assessed   Lower Extremity Assessment Lower Extremity Assessment: Defer to PT evaluation       Communication Communication Communication: No difficulties   Cognition Arousal/Alertness: Awake/alert Behavior During Therapy: WFL for tasks assessed/performed Overall Cognitive Status: Within Functional Limits for tasks assessed                                       General Comments  Splint in place    Exercises     Shoulder Instructions      Home Living Family/patient expects to be discharged to:: Private residence Living Arrangements: Spouse/significant other Available Help at Discharge: Family;Available 24 hours/day Type of Home: House Home Access: Stairs to enter [employer] of Steps: 2 Entrance Stairs-Rails: Left Home Layout: One level     Bathroom Shower/Tub: Occupational psychologist: Handicapped height Bathroom Accessibility: Yes   Home Equipment: Conservation officer, nature (2 wheels);Cane - single point;Crutches;BSC/3in1;Shower seat          Prior Functioning/Environment Prior Level of Function : Independent/Modified Independent             Mobility Comments: does not use AD ADLs Comments:  does ADL and IADL. hasn't been driving since covid started        OT Problem List: Decreased strength;Decreased range of motion;Decreased activity tolerance;Impaired balance (sitting and/or standing);Decreased knowledge of use of DME or AE;Decreased knowledge of precautions      OT Treatment/Interventions: Self-care/ADL training;Therapeutic exercise;Energy conservation;DME and/or AE instruction;Therapeutic activities;Patient/family education    OT Goals(Current goals can be found in the care plan section) Acute Rehab OT Goals Patient Stated Goal: Get better then go home OT Goal Formulation: With patient Time For Goal Achievement: 11/08/21 Potential to Achieve Goals: Good  OT Frequency: Min 3X/week    Co-evaluation              AM-PAC OT "6 Clicks" Daily Activity     Outcome Measure Help from another person eating meals?: A Little Help from another person taking care of personal grooming?: A Little Help from another person toileting, which includes using toliet, bedpan, or urinal?: A Lot Help from another person bathing (including washing, rinsing, drying)?: A Little Help from another person to put on and taking off regular upper body clothing?: A Little Help from another person to put on and taking off regular lower body clothing?: A Lot 6 Click Score: 16   End of Session Equipment Utilized During Treatment: Gait belt;Rolling walker (2 wheels) Nurse Communication: Mobility status  Activity Tolerance: Patient tolerated treatment well Patient left: in chair;with call bell/phone within reach;with chair alarm set  OT Visit Diagnosis: Unsteadiness on feet (R26.81);Other abnormalities of gait and mobility (R26.89);Muscle weakness (generalized) (M62.81);History of falling (Z91.81);Pain                Time: 3704-8889 OT Time Calculation (min): 25 min Charges:  OT General Charges $OT Visit: 1 Visit OT Evaluation $OT Eval Low Complexity: 1 Low OT Treatments $Self Care/Home  Management : 8-22 mins  Jaice Digioia MSOT, OTR/L Acute Rehab Office: [place] 10/25/2021, 8:49 AM

## 2021-10-26 DIAGNOSIS — Z9181 History of falling: Secondary | ICD-10-CM | POA: Diagnosis not present

## 2021-10-26 DIAGNOSIS — B0229 Other postherpetic nervous system involvement: Secondary | ICD-10-CM | POA: Diagnosis not present

## 2021-10-26 DIAGNOSIS — R6889 Other general symptoms and signs: Secondary | ICD-10-CM | POA: Diagnosis not present

## 2021-10-26 DIAGNOSIS — S82841A Displaced bimalleolar fracture of right lower leg, initial encounter for closed fracture: Secondary | ICD-10-CM | POA: Diagnosis not present

## 2021-10-26 DIAGNOSIS — N390 Urinary tract infection, site not specified: Secondary | ICD-10-CM | POA: Diagnosis not present

## 2021-10-26 DIAGNOSIS — Z741 Need for assistance with personal care: Secondary | ICD-10-CM | POA: Diagnosis not present

## 2021-10-26 DIAGNOSIS — L0390 Cellulitis, unspecified: Secondary | ICD-10-CM | POA: Diagnosis not present

## 2021-10-26 DIAGNOSIS — S82841D Displaced bimalleolar fracture of right lower leg, subsequent encounter for closed fracture with routine healing: Secondary | ICD-10-CM | POA: Diagnosis not present

## 2021-10-26 DIAGNOSIS — S8254XD Nondisplaced fracture of medial malleolus of right tibia, subsequent encounter for closed fracture with routine healing: Secondary | ICD-10-CM | POA: Diagnosis not present

## 2021-10-26 DIAGNOSIS — I2510 Atherosclerotic heart disease of native coronary artery without angina pectoris: Secondary | ICD-10-CM | POA: Diagnosis not present

## 2021-10-26 DIAGNOSIS — R600 Localized edema: Secondary | ICD-10-CM | POA: Diagnosis not present

## 2021-10-26 DIAGNOSIS — R5381 Other malaise: Secondary | ICD-10-CM | POA: Diagnosis not present

## 2021-10-26 DIAGNOSIS — E785 Hyperlipidemia, unspecified: Secondary | ICD-10-CM | POA: Diagnosis not present

## 2021-10-26 DIAGNOSIS — I10 Essential (primary) hypertension: Secondary | ICD-10-CM | POA: Diagnosis not present

## 2021-10-26 DIAGNOSIS — W19XXXA Unspecified fall, initial encounter: Secondary | ICD-10-CM | POA: Diagnosis not present

## 2021-10-26 DIAGNOSIS — Z7902 Long term (current) use of antithrombotics/antiplatelets: Secondary | ICD-10-CM | POA: Diagnosis not present

## 2021-10-26 DIAGNOSIS — R42 Dizziness and giddiness: Secondary | ICD-10-CM | POA: Diagnosis not present

## 2021-10-26 DIAGNOSIS — E876 Hypokalemia: Secondary | ICD-10-CM | POA: Diagnosis not present

## 2021-10-26 DIAGNOSIS — M6281 Muscle weakness (generalized): Secondary | ICD-10-CM | POA: Diagnosis not present

## 2021-10-26 DIAGNOSIS — R601 Generalized edema: Secondary | ICD-10-CM | POA: Diagnosis not present

## 2021-10-26 DIAGNOSIS — D509 Iron deficiency anemia, unspecified: Secondary | ICD-10-CM | POA: Diagnosis not present

## 2021-10-26 DIAGNOSIS — R2689 Other abnormalities of gait and mobility: Secondary | ICD-10-CM | POA: Diagnosis not present

## 2021-10-26 DIAGNOSIS — Z8673 Personal history of transient ischemic attack (TIA), and cerebral infarction without residual deficits: Secondary | ICD-10-CM | POA: Diagnosis not present

## 2021-10-26 DIAGNOSIS — E119 Type 2 diabetes mellitus without complications: Secondary | ICD-10-CM | POA: Diagnosis not present

## 2021-10-26 DIAGNOSIS — I480 Paroxysmal atrial fibrillation: Secondary | ICD-10-CM | POA: Diagnosis not present

## 2021-10-26 DIAGNOSIS — E870 Hyperosmolality and hypernatremia: Secondary | ICD-10-CM | POA: Diagnosis not present

## 2021-10-26 DIAGNOSIS — D649 Anemia, unspecified: Secondary | ICD-10-CM | POA: Diagnosis not present

## 2021-10-26 DIAGNOSIS — K5900 Constipation, unspecified: Secondary | ICD-10-CM | POA: Diagnosis not present

## 2021-10-26 DIAGNOSIS — Z743 Need for continuous supervision: Secondary | ICD-10-CM | POA: Diagnosis not present

## 2021-10-26 DIAGNOSIS — Z79899 Other long term (current) drug therapy: Secondary | ICD-10-CM | POA: Diagnosis not present

## 2021-10-26 DIAGNOSIS — Z951 Presence of aortocoronary bypass graft: Secondary | ICD-10-CM | POA: Diagnosis not present

## 2021-10-26 LAB — BASIC METABOLIC PANEL
Anion gap: 7 (ref 5–15)
BUN: 11 mg/dL (ref 8–23)
CO2: 27 mmol/L (ref 22–32)
Calcium: 8.8 mg/dL — ABNORMAL LOW (ref 8.9–10.3)
Chloride: 106 mmol/L (ref 98–111)
Creatinine, Ser: 0.66 mg/dL (ref 0.44–1.00)
GFR, Estimated: >60 mL/min (ref >60)
Glucose, Bld: 120 mg/dL — ABNORMAL HIGH (ref 70–99)
Potassium: 3.7 mmol/L (ref 3.5–5.1)
Sodium: 140 mmol/L (ref 135–145)

## 2021-10-26 LAB — CBC
HCT: 26.3 % — ABNORMAL LOW (ref 36.0–46.0)
Hemoglobin: 8.7 g/dL — ABNORMAL LOW (ref 12.0–15.0)
MCH: 30.6 pg (ref 26.0–34.0)
MCHC: 33.1 g/dL (ref 30.0–36.0)
MCV: 92.6 fL (ref 80.0–100.0)
Platelets: 357 10*3/uL (ref 150–400)
RBC: 2.84 MIL/uL — ABNORMAL LOW (ref 3.87–5.11)
RDW: 15.2 % (ref 11.5–15.5)
WBC: 11.3 10*3/uL — ABNORMAL HIGH (ref 4.0–10.5)
nRBC: 0 % (ref 0.0–0.2)

## 2021-10-26 MED ORDER — CEPHALEXIN 500 MG PO CAPS: 500.0000 mg | ORAL_CAPSULE | Freq: Three times a day (TID) | ORAL | 0 refills | Status: AC

## 2021-10-26 MED ORDER — OXYCODONE HCL 5 MG PO TABS: 5.0000 mg | ORAL_TABLET | ORAL | 0 refills | Status: DC | PRN

## 2021-10-26 MED ORDER — POLYETHYLENE GLYCOL 3350 17 G PO PACK: 17.0000 g | PACK | Freq: Every day | ORAL | 0 refills | Status: DC | PRN

## 2021-10-26 NOTE — Progress Notes (Signed)
IV Team consulted for PIV assessment/placement.  Upon arrival, pt currently refusing a PIV.  Pt states she spoke with night shift nurse and that nurse was going to reach out to the provider to see if alt route could be given for Rx's that are currently IV.  Day shift RN made aware of current refusal and pt's recollection of IV conversation.

## 2021-10-26 NOTE — Plan of Care (Signed)

## 2021-10-26 NOTE — TOC Progression Note (Addendum)
Transition of Care [facility]) - Progression Note    Patient Details  Name: Jamie Hodge MRN: [id number] Date of Birth: [birth year]  Transition of Care [facility]) CM/SW Contact  Joanne Chars, LCSW Phone Number: 10/26/2021, 10:53 AM  Clinical Narrative:   Bed offers presented to pt, she would like to accept offer at [facility].  CSW confirmed with [facility] that she can accept pt today. Auth request submitted in [place].    1130: Auth approved: [id number], 5 days: 10/6-10/10.  PASSR received: [id number].    Expected Discharge Plan: Skilled Nursing Facility Barriers to Discharge: SNF Pending bed offer  Expected Discharge Plan and Services Expected Discharge Plan: [place] In-house Referral: Clinical Social Work   Post Acute Care Choice: [place] Living arrangements for the past 2 months: [place] Determinants of Health (SDOH) Interventions    Readmission Risk Interventions    10/23/2021    3:02 PM  Readmission Risk Prevention Plan  Post Dischage Appt Complete  Medication Screening Complete  Transportation Screening Complete

## 2021-10-26 NOTE — Discharge Summary (Signed)
Physician Discharge Summary  Jamie Hodge YYT:[id number] DOB: November 25, 1940 DOA: 10/24/2021  PCP: Ramiro Harvest, PA-C  Admit date: 10/24/2021 Discharge date: 10/26/2021  Admitted From: Home Disposition: SNF  Discharge Condition:Stable CODE STATUS:FULL Diet recommendation: Heart Healthy   Brief/Interim Summary: Patient is a 81 year old female with history of anemia, coronary artery disease, status post CABG, hypertension, hyperlipidemia, paroxysmal A-fib, CVA, postoperative neuralgia, GI bleed who presented with complaint of decreased ability, ankle pain.  She was recently admitted here from 9/30 and was discharged on 10/3 after she was managed for upper GI bleed, EGD showing gastric ulcer.  Also found to have right medial malleoli fracture that occurred during this syncopal event to patient's anemia.  She was placed in a splint with plan for conversion to cast versus boot as outpatient.  She was recommended to go to SNF but she preferred to go home and came back with decreased mobility, significant pain on the right ankle, unable to take care of herself.  On presentation, she was hemodynamically stable.  Lab work showed mild leukocytosis of 11.4, hemoglobin of 8.5, UA showed large leukocytes, bacteria.  Started on ceftriaxone for possible UTI.  Right ankle x-ray showed acute right medial and lateral malleoli fracture.  PT/OT consulted, TOC consulted for SNF placement.  PT/OT recommending SNF.  She will follow-up with orthopedics as an outpatient in a week.  Medically stable for discharge  Following  problems were addressed during her hospitalization:  Right ankle fracture: Was managed conservatively on last admission and the plan is for follow-up with orthopedics for conversion to cast versus CAM boot.  Plan to follow-up with Dr. Percell Miller in a week.  Presented with significant pain, problem with ambulation.  Initially recommended SNF but declined and went home. Admitted with plan for SNF, TOC  following.  PT/OT recommending SNF   Hypokalemia:Supplemented  and corrected  Suspected UTI: UA showed a lot of leukocytes, bacteria.  Lab work showed leukocytosis. Started  on ceftriaxone.  Urine culture shows pansensitive E. coli.  Antibiotics changed to Keflex  Normocytic anemia: Currently hemoglobin stable.  Monitor   Recent GI bleed: Underwent EGD on last admission, showing gastric ulcer.  Resolved.  Continue PPI   Coronary heart disease: History of CABG.  On Plavix, Lipitor, ARB, carvedilol   Paroxysmal A-fib: Currently in normal sinus rhythm.Not on anticoagulation   Hypertension: Monitor blood pressure.  Continue amlodipine, Coreg   HLD: Lipitor, Plavix   Postherpetic neuralgia: Continue amitriptyline, gabapentin   Obesity: BMI of 33.3  Discharge Diagnoses:  Principal Problem:   Ankle fracture, bimalleolar, closed Active Problems:   IDA (iron deficiency anemia)   CAD (coronary artery disease)   Hypertension   Hyperlipidemia   S/P CABG x 6   Atrial fibrillation (HCC)   UTI (urinary tract infection)    Discharge Instructions  Discharge Instructions     Diet - low sodium heart healthy   Complete by: As directed    Discharge instructions   Complete by: As directed    1)Please take prescribed medications as instructed 2)Follow up with orthopedics in a week.  Name and number the provider has been attached   Increase activity slowly   Complete by: As directed       Allergies as of 10/26/2021       Reactions   Mango Flavor Anaphylaxis   Contrast Media [iodinated Contrast Media] Other (See Comments)   Unknown reaction        Medication List     TAKE these  medications    amitriptyline 10 MG tablet Commonly known as: ELAVIL TAKE 1 TABLET BY MOUTH EVERYDAY AT BEDTIME What changed: See the new instructions.   amLODipine 2.5 MG tablet Commonly known as: NORVASC Take 2.5 mg by mouth daily.   atorvastatin 80 MG tablet Commonly known as: LIPITOR TAKE  1 TABLET BY MOUTH EVERY DAY   carvedilol 6.25 MG tablet Commonly known as: COREG Take 1 tablet (6.25 mg total) by mouth 2 (two) times daily.   cephALEXin 500 MG capsule Commonly known as: KEFLEX Take 1 capsule (500 mg total) by mouth 3 (three) times daily for 3 days.   clopidogrel 75 MG tablet Commonly known as: PLAVIX Take 1 tablet (75 mg total) by mouth daily.   gabapentin 300 MG capsule Commonly known as: NEURONTIN Take 900 mg by mouth at bedtime.   MULTIVITAMIN ADULT PO Take 1 tablet by mouth daily.   Nasacort Allergy 24HR 55 MCG/ACT Aero nasal inhaler Generic drug: triamcinolone Place 2 sprays into the nose daily as needed (sinus congestion).   oxyCODONE 5 MG immediate release tablet Commonly known as: Oxy IR/ROXICODONE Take 1 tablet (5 mg total) by mouth every 4 (four) hours as needed for moderate pain.   pantoprazole 40 MG tablet Commonly known as: PROTONIX Take 1 tablet (40 mg total) by mouth 2 (two) times daily before a meal.   polyethylene glycol 17 g packet Commonly known as: MIRALAX / GLYCOLAX Take 17 g by mouth daily as needed for mild constipation.   PRESERVISION AREDS 2+MULTI VIT PO Take 2 tablets by mouth daily.   sertraline 50 MG tablet Commonly known as: ZOLOFT Take 100 mg by mouth daily.   valsartan 160 MG tablet Commonly known as: DIOVAN Take 1 tablet (160 mg total) by mouth daily.        Follow-up Information     Renette Butters, MD. Schedule an appointment as soon as possible for a visit in 1 week(s).   Specialty: Orthopedic Surgery Contact information: [address] [place] [address] [phone]                Allergies  Allergen Reactions   Mango Flavor Anaphylaxis   Contrast Media [Iodinated Contrast Media] Other (See Comments)    Unknown reaction    Consultations: None   Procedures/Studies: DG Ankle Complete Right  Result Date: 10/24/2021 CLINICAL DATA:  Status post trauma. EXAM:  RIGHT ANKLE - COMPLETE 3+ VIEW COMPARISON:  October 20, 2021 FINDINGS: Acute, nondisplaced fractures are seen involving the right medial malleolus and right lateral malleolus. There is no evidence of dislocation. There is no evidence of arthropathy or other focal bone abnormality. Moderate severity diffuse soft tissue swelling is noted. IMPRESSION: Acute fractures of the right medial malleolus and right lateral malleolus. Electronically Signed   By: Virgina Norfolk M.D.   On: 10/24/2021 20:24   DG Ankle Complete Right  Result Date: 10/20/2021 CLINICAL DATA:  Fall, medial ankle pain, swelling, bruising EXAM: RIGHT ANKLE - COMPLETE 3 VIEW COMPARISON:  None Available. FINDINGS: Fracture through the medial malleolus which is minimally displaced. Overlying soft tissue swelling. The ankle mortise is preserved. IMPRESSION: Minimally displaced fracture through the medial malleolus. Electronically Signed   By: Merilyn Baba M.D.   On: 10/20/2021 03:24      Subjective: Patient seen and examined the bedside today.  Hemodynamically stable for discharge.  Discharge Exam: Vitals:   10/25/21 2148 10/26/21 0844  BP: (!) 110/57 (!) 107/59  Pulse: 83  80  Resp:  17  Temp: 98 F (36.7 C) 98.6 F (37 C)  SpO2: 95% 93%   Vitals:   10/25/21 0749 10/25/21 1726 10/25/21 2148 10/26/21 0844  BP: (!) 111/56 (!) 114/55 (!) 110/57 (!) 107/59  Pulse: 88 78 83 80  Resp:    17  Temp: 98.3 F (36.8 C) 98.7 F (37.1 C) 98 F (36.7 C) 98.6 F (37 C)  TempSrc: Oral Oral Oral Oral  SpO2: 93% 93% 95% 93%    General: Pt is alert, awake, not in acute distress Cardiovascular: RRR, S1/S2 +, no rubs, no gallops Respiratory: CTA bilaterally, no wheezing, no rhonchi Abdominal: Soft, NT, ND, bowel sounds + Extremities: no edema, no cyanosis,splint on the right foot     The results of significant diagnostics from this hospitalization (including imaging, microbiology, ancillary and laboratory) are listed below for  reference.     Microbiology: Recent Results (from the past 240 hour(s))  Surgical pcr screen     Status: Abnormal   Collection Time: 10/20/21  5:29 PM   Specimen: Nasal Mucosa; Nasal Swab  Result Value Ref Range Status   MRSA, PCR NEGATIVE NEGATIVE Final   Staphylococcus aureus POSITIVE (A) NEGATIVE Final    Comment: (NOTE) The Xpert SA Assay (FDA approved for NASAL specimens in patients 14 years of age and older), is one component of a comprehensive surveillance program. It is not intended to diagnose infection nor to guide or monitor treatment. Performed at [facility], [place] [address], [place], [place] [address]   Urine Culture     Status: Abnormal (Preliminary result)   Collection Time: 10/24/21  9:55 PM   Specimen: Urine, Clean Catch  Result Value Ref Range Status   Specimen Description URINE, CLEAN CATCH  Final   Special Requests   Final    NONE Performed at [facility], [place] [address] [place], [place] [address]    Culture >=100,000 COLONIES/mL ESCHERICHIA COLI (A)  Final   Report Status PENDING  Incomplete   Organism ID, Bacteria ESCHERICHIA COLI (A)  Final      Susceptibility   Escherichia coli - MIC*    AMPICILLIN 4 SENSITIVE Sensitive     CEFAZOLIN <=4 SENSITIVE Sensitive     CEFEPIME <=0.12 SENSITIVE Sensitive     CEFTRIAXONE <=0.25 SENSITIVE Sensitive     CIPROFLOXACIN <=0.25 SENSITIVE Sensitive     GENTAMICIN <=1 SENSITIVE Sensitive     IMIPENEM <=0.25 SENSITIVE Sensitive     NITROFURANTOIN <=16 SENSITIVE Sensitive     TRIMETH/SULFA <=20 SENSITIVE Sensitive     AMPICILLIN/SULBACTAM <=2 SENSITIVE Sensitive     PIP/TAZO <=4 SENSITIVE Sensitive     * >=100,000 COLONIES/mL ESCHERICHIA COLI     Labs: BNP (last 3 results) No results for input(s): "BNP" in the last 8760 hours. Basic Metabolic Panel: Recent Labs  Lab 10/21/21 0412 10/22/21 0341 10/23/21 0338 10/24/21 2051 10/25/21 0301 10/26/21 0241  NA 137 136 137 135 136 140   K 3.9 3.1* 3.6 3.6 2.9* 3.7  CL 106 105 105 101 101 106  CO2 '26 26 26 22 26 27  '$ GLUCOSE 118* 126* 109* 107* 99 120*  BUN 12 8 7* '10 9 11  '$ CREATININE 0.85 0.71 0.60 0.66 0.66 0.66  CALCIUM 8.2* 8.5* 8.5* 8.7* 8.4* 8.8*  MG 1.8 2.0 1.7  --  1.8  --    Liver Function Tests: Recent Labs  Lab 10/20/21 0359 10/24/21 2051 10/25/21 0301  AST 24 21  15  ALT '19 17 15  '$ ALKPHOS 75 64 61  BILITOT 0.6 1.0 0.8  PROT 6.8 5.4* 5.2*  ALBUMIN 3.9 2.7* 2.4*   No results for input(s): "LIPASE", "AMYLASE" in the last 168 hours. No results for input(s): "AMMONIA" in the last 168 hours. CBC: Recent Labs  Lab 10/20/21 0359 10/20/21 1953 10/21/21 0412 10/21/21 1957 10/22/21 0341 10/22/21 1226 10/23/21 0758 10/24/21 2051 10/25/21 0301 10/26/21 0241  WBC 14.7*  --  11.4*  --  12.1*  --  10.3 11.4* 10.8* 11.3*  NEUTROABS 12.3*  --  7.7  --  8.9*  --  7.3 9.4*  --   --   HGB 8.6*   < > 7.5*   < > 6.7* 8.4* 8.2* 8.5* 8.2* 8.7*  HCT 26.9*   < > 23.8*   < > 21.3* 26.2* 26.1* 25.7* 24.9* 26.3*  MCV 93.4  --  96.7  --  96.8  --  95.6 93.1 91.2 92.6  PLT 276  --  259  --  250  --  259 317 337 357   < > = values in this interval not displayed.   Cardiac Enzymes: No results for input(s): "CKTOTAL", "CKMB", "CKMBINDEX", "TROPONINI" in the last 168 hours. BNP: Invalid input(s): "POCBNP" CBG: No results for input(s): "GLUCAP" in the last 168 hours. D-Dimer No results for input(s): "DDIMER" in the last 72 hours. Hgb A1c No results for input(s): "HGBA1C" in the last 72 hours. Lipid Profile No results for input(s): "CHOL", "HDL", "LDLCALC", "TRIG", "CHOLHDL", "LDLDIRECT" in the last 72 hours. Thyroid function studies No results for input(s): "TSH", "T4TOTAL", "T3FREE", "THYROIDAB" in the last 72 hours.  Invalid input(s): "FREET3" Anemia work up No results for input(s): "VITAMINB12", "FOLATE", "FERRITIN", "TIBC", "IRON", "RETICCTPCT" in the last 72 hours. Urinalysis    Component Value Date/Time    COLORURINE YELLOW 10/24/2021 2155   APPEARANCEUR CLOUDY (A) 10/24/2021 2155   LABSPEC 1.013 10/24/2021 2155   PHURINE 6.0 10/24/2021 2155   GLUCOSEU NEGATIVE 10/24/2021 2155   HGBUR SMALL (A) 10/24/2021 2155   BILIRUBINUR NEGATIVE 10/24/2021 2155   KETONESUR 20 (A) 10/24/2021 2155   PROTEINUR 30 (A) 10/24/2021 2155   NITRITE NEGATIVE 10/24/2021 2155   LEUKOCYTESUR LARGE (A) 10/24/2021 2155   Sepsis Labs Recent Labs  Lab 10/23/21 0758 10/24/21 2051 10/25/21 0301 10/26/21 0241  WBC 10.3 11.4* 10.8* 11.3*   Microbiology Recent Results (from the past 240 hour(s))  Surgical pcr screen     Status: Abnormal   Collection Time: 10/20/21  5:29 PM   Specimen: Nasal Mucosa; Nasal Swab  Result Value Ref Range Status   MRSA, PCR NEGATIVE NEGATIVE Final   Staphylococcus aureus POSITIVE (A) NEGATIVE Final    Comment: (NOTE) The Xpert SA Assay (FDA approved for NASAL specimens in patients 61 years of age and older), is one component of a comprehensive surveillance program. It is not intended to diagnose infection nor to guide or monitor treatment. Performed at [facility], [place] [address] [place], [place] [address]   Urine Culture     Status: Abnormal (Preliminary result)   Collection Time: 10/24/21  9:55 PM   Specimen: Urine, Clean Catch  Result Value Ref Range Status   Specimen Description URINE, CLEAN CATCH  Final   Special Requests   Final    NONE Performed at [facility], [place] [address] [place], [place] [address]    Culture >=100,000 COLONIES/mL ESCHERICHIA COLI (A)  Final   Report Status  PENDING  Incomplete   Organism ID, Bacteria ESCHERICHIA COLI (A)  Final      Susceptibility   Escherichia coli - MIC*    AMPICILLIN 4 SENSITIVE Sensitive     CEFAZOLIN <=4 SENSITIVE Sensitive     CEFEPIME <=0.12 SENSITIVE Sensitive     CEFTRIAXONE <=0.25 SENSITIVE Sensitive     CIPROFLOXACIN <=0.25 SENSITIVE Sensitive     GENTAMICIN <=1 SENSITIVE Sensitive      IMIPENEM <=0.25 SENSITIVE Sensitive     NITROFURANTOIN <=16 SENSITIVE Sensitive     TRIMETH/SULFA <=20 SENSITIVE Sensitive     AMPICILLIN/SULBACTAM <=2 SENSITIVE Sensitive     PIP/TAZO <=4 SENSITIVE Sensitive     * >=100,000 COLONIES/mL ESCHERICHIA COLI    Please note: You were cared for by a hospitalist during your hospital stay. Once you are discharged, your primary care physician will handle any further medical issues. Please note that NO REFILLS for any discharge medications will be authorized once you are discharged, as it is imperative that you return to your primary care physician (or establish a relationship with a primary care physician if you do not have one) for your post hospital discharge needs so that they can reassess your need for medications and monitor your lab values.    Time coordinating discharge: 40 minutes  SIGNED:   Shelly Coss, MD  [facility] 10/26/2021, 11:34 AM Pager [phone]  If 7PM-7AM, please contact night-coverage [email or web address] Password [email or web address]

## 2021-10-26 NOTE — TOC Transition Note (Signed)
Transition of Care [facility]) - CM/SW Discharge Note   Patient Details  Name: Jamie Hodge MRN: [id number] Date of Birth: 1940/12/07  Transition of Care [facility]) CM/SW Contact:  Joanne Chars, LCSW Phone Number: 10/26/2021, 1:15 PM   Clinical Narrative:   Pt discharging to [facility].  RN call report to [phone].     Final next level of care: Skilled Nursing Facility Barriers to Discharge: Barriers Resolved   Patient Goals and CMS Choice Patient states their goals for this hospitalization and ongoing recovery are:: "function on my own" CMS Medicare.gov Compare Post Acute Care list provided to:: Patient Choice offered to / list presented to : Patient  Discharge Placement              Patient chooses bed at:  [facility]) Patient to be transferred to facility by: [place] Name of family member notified: husband Glendell Docker Patient and family notified of of transfer: 10/26/21  Discharge Plan and Services In-house Referral: Clinical Social Work   Post Acute Care Choice: [place]                               Social Determinants of Health ([place]) Interventions     Readmission Risk Interventions    10/23/2021    3:02 PM  Readmission Risk Prevention Plan  Post Dischage Appt Complete  Medication Screening Complete  Transportation Screening Complete

## 2021-10-26 NOTE — TOC Initial Note (Signed)
Transition of Care [facility]) - Initial/Assessment Note    Patient Details  Name: Jamie Hodge MRN: [id number] Date of Birth: September 12, 1940  Transition of Care [facility]) CM/SW Contact:    Joanne Chars, LCSW Phone Number: 10/26/2021, 10:51 AM  Clinical Narrative:     CSW met with pt regarding DC recommendation for SNF.    Pt agreeable to this, choice document given, permission given to send out referral in hub.  Permission given to speak with husband Glendell Docker, son Rob.  Pt lives at home with husband, no current services.  Pt is vaccinated for covid with multiple boosters.    Referral sent out in hub for SNF.           Expected Discharge Plan: Skilled Nursing Facility Barriers to Discharge: SNF Pending bed offer   Patient Goals and CMS Choice Patient states their goals for this hospitalization and ongoing recovery are:: "function on my own" CMS Medicare.gov Compare Post Acute Care list provided to:: Patient Choice offered to / list presented to : Patient  Expected Discharge Plan and Services Expected Discharge Plan: [place] In-house Referral: Clinical Social Work   Post Acute Care Choice: [place] Living arrangements for the past 2 months: [place]                                      Prior Living Arrangements/Services Living arrangements for the past 2 months: Single Family Home Lives with:: Spouse Patient language and need for interpreter reviewed:: Yes Do you feel safe going back to the place where you live?: Yes      Need for Family Participation in Patient Care: Yes (Comment) Care giver support system in place?: Yes (comment) Current home services: Other (comment) (none) Criminal Activity/Legal Involvement Pertinent to Current Situation/Hospitalization: No - Comment as needed  Activities of Daily Living      Permission Sought/Granted Permission sought to share information with : Family Supports Permission granted to  share information with : Yes, Verbal Permission Granted  Share Information with NAME: husband Glendell Docker, son Rob  Permission granted to share info w AGENCY: SNF        Emotional Assessment Appearance:: Appears stated age Attitude/Demeanor/Rapport: Engaged Affect (typically observed): Appropriate, Pleasant Orientation: : Oriented to Self, Oriented to Place, Oriented to  Time, Oriented to Situation Alcohol / Substance Use: Not Applicable Psych Involvement: No (comment)  Admission diagnosis:  Generalized weakness [R53.1] Ankle fracture, bimalleolar, closed [J24.268T] Urinary tract infection without hematuria, site unspecified [N39.0] Patient Active Problem List   Diagnosis Date Noted   UTI (urinary tract infection) 10/24/2021   Ankle fracture, bimalleolar, closed 10/24/2021   GIB (gastrointestinal bleeding) 10/21/2021   Hematemesis 10/20/2021   Closed right ankle fracture 10/20/2021   IDA (iron deficiency anemia) 10/20/2021   Atrial fibrillation ([place]) 12/07/2019   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Phone: [phone]: [phone]    Social Determinants of Health (SDOH) Interventions    Readmission Risk Interventions    10/23/2021  3:02 PM  Readmission Risk Prevention Plan  Post Dischage Appt Complete  Medication Screening Complete  Transportation Screening Complete

## 2021-10-27 LAB — URINE CULTURE: Culture: >=100000 — AB

## 2021-10-29 DIAGNOSIS — E785 Hyperlipidemia, unspecified: Secondary | ICD-10-CM | POA: Diagnosis not present

## 2021-10-29 DIAGNOSIS — I10 Essential (primary) hypertension: Secondary | ICD-10-CM | POA: Diagnosis not present

## 2021-10-29 DIAGNOSIS — K5900 Constipation, unspecified: Secondary | ICD-10-CM | POA: Diagnosis not present

## 2021-10-29 DIAGNOSIS — D649 Anemia, unspecified: Secondary | ICD-10-CM | POA: Diagnosis not present

## 2021-10-29 DIAGNOSIS — B0229 Other postherpetic nervous system involvement: Secondary | ICD-10-CM | POA: Diagnosis not present

## 2021-10-29 DIAGNOSIS — E876 Hypokalemia: Secondary | ICD-10-CM | POA: Diagnosis not present

## 2021-10-29 DIAGNOSIS — I2510 Atherosclerotic heart disease of native coronary artery without angina pectoris: Secondary | ICD-10-CM | POA: Diagnosis not present

## 2021-10-29 DIAGNOSIS — R5381 Other malaise: Secondary | ICD-10-CM | POA: Diagnosis not present

## 2021-10-29 DIAGNOSIS — I480 Paroxysmal atrial fibrillation: Secondary | ICD-10-CM | POA: Diagnosis not present

## 2021-10-29 DIAGNOSIS — N390 Urinary tract infection, site not specified: Secondary | ICD-10-CM | POA: Diagnosis not present

## 2021-10-31 DIAGNOSIS — K5900 Constipation, unspecified: Secondary | ICD-10-CM | POA: Diagnosis not present

## 2021-10-31 DIAGNOSIS — E870 Hyperosmolality and hypernatremia: Secondary | ICD-10-CM | POA: Diagnosis not present

## 2021-10-31 DIAGNOSIS — I10 Essential (primary) hypertension: Secondary | ICD-10-CM | POA: Diagnosis not present

## 2021-10-31 DIAGNOSIS — E785 Hyperlipidemia, unspecified: Secondary | ICD-10-CM | POA: Diagnosis not present

## 2021-10-31 DIAGNOSIS — I480 Paroxysmal atrial fibrillation: Secondary | ICD-10-CM | POA: Diagnosis not present

## 2021-10-31 DIAGNOSIS — I2510 Atherosclerotic heart disease of native coronary artery without angina pectoris: Secondary | ICD-10-CM | POA: Diagnosis not present

## 2021-10-31 DIAGNOSIS — N390 Urinary tract infection, site not specified: Secondary | ICD-10-CM | POA: Diagnosis not present

## 2021-10-31 DIAGNOSIS — D649 Anemia, unspecified: Secondary | ICD-10-CM | POA: Diagnosis not present

## 2021-11-01 DIAGNOSIS — I480 Paroxysmal atrial fibrillation: Secondary | ICD-10-CM | POA: Diagnosis not present

## 2021-11-01 DIAGNOSIS — B0229 Other postherpetic nervous system involvement: Secondary | ICD-10-CM | POA: Diagnosis not present

## 2021-11-01 DIAGNOSIS — L0390 Cellulitis, unspecified: Secondary | ICD-10-CM | POA: Diagnosis not present

## 2021-11-01 DIAGNOSIS — I10 Essential (primary) hypertension: Secondary | ICD-10-CM | POA: Diagnosis not present

## 2021-11-01 DIAGNOSIS — K5900 Constipation, unspecified: Secondary | ICD-10-CM | POA: Diagnosis not present

## 2021-11-01 DIAGNOSIS — E785 Hyperlipidemia, unspecified: Secondary | ICD-10-CM | POA: Diagnosis not present

## 2021-11-01 DIAGNOSIS — I2510 Atherosclerotic heart disease of native coronary artery without angina pectoris: Secondary | ICD-10-CM | POA: Diagnosis not present

## 2021-11-01 DIAGNOSIS — N390 Urinary tract infection, site not specified: Secondary | ICD-10-CM | POA: Diagnosis not present

## 2021-11-01 DIAGNOSIS — E876 Hypokalemia: Secondary | ICD-10-CM | POA: Diagnosis not present

## 2021-11-01 DIAGNOSIS — D649 Anemia, unspecified: Secondary | ICD-10-CM | POA: Diagnosis not present

## 2021-11-02 DIAGNOSIS — S8254XD Nondisplaced fracture of medial malleolus of right tibia, subsequent encounter for closed fracture with routine healing: Secondary | ICD-10-CM | POA: Diagnosis not present

## 2021-11-05 DIAGNOSIS — B0229 Other postherpetic nervous system involvement: Secondary | ICD-10-CM | POA: Diagnosis not present

## 2021-11-05 DIAGNOSIS — I10 Essential (primary) hypertension: Secondary | ICD-10-CM | POA: Diagnosis not present

## 2021-11-05 DIAGNOSIS — I2510 Atherosclerotic heart disease of native coronary artery without angina pectoris: Secondary | ICD-10-CM | POA: Diagnosis not present

## 2021-11-05 DIAGNOSIS — E785 Hyperlipidemia, unspecified: Secondary | ICD-10-CM | POA: Diagnosis not present

## 2021-11-05 DIAGNOSIS — R600 Localized edema: Secondary | ICD-10-CM | POA: Diagnosis not present

## 2021-11-05 DIAGNOSIS — K5900 Constipation, unspecified: Secondary | ICD-10-CM | POA: Diagnosis not present

## 2021-11-05 DIAGNOSIS — R5381 Other malaise: Secondary | ICD-10-CM | POA: Diagnosis not present

## 2021-11-05 DIAGNOSIS — L0390 Cellulitis, unspecified: Secondary | ICD-10-CM | POA: Diagnosis not present

## 2021-11-05 DIAGNOSIS — I480 Paroxysmal atrial fibrillation: Secondary | ICD-10-CM | POA: Diagnosis not present

## 2021-11-05 DIAGNOSIS — D649 Anemia, unspecified: Secondary | ICD-10-CM | POA: Diagnosis not present

## 2021-11-06 DIAGNOSIS — R601 Generalized edema: Secondary | ICD-10-CM | POA: Diagnosis not present

## 2021-11-11 ENCOUNTER — Other Ambulatory Visit: Payer: Self-pay | Admitting: Internal Medicine

## 2021-11-12 DIAGNOSIS — I480 Paroxysmal atrial fibrillation: Secondary | ICD-10-CM | POA: Diagnosis not present

## 2021-11-12 DIAGNOSIS — I10 Essential (primary) hypertension: Secondary | ICD-10-CM | POA: Diagnosis not present

## 2021-11-12 DIAGNOSIS — L0390 Cellulitis, unspecified: Secondary | ICD-10-CM | POA: Diagnosis not present

## 2021-11-12 DIAGNOSIS — D649 Anemia, unspecified: Secondary | ICD-10-CM | POA: Diagnosis not present

## 2021-11-12 DIAGNOSIS — I2510 Atherosclerotic heart disease of native coronary artery without angina pectoris: Secondary | ICD-10-CM | POA: Diagnosis not present

## 2021-11-12 DIAGNOSIS — R5381 Other malaise: Secondary | ICD-10-CM | POA: Diagnosis not present

## 2021-11-12 DIAGNOSIS — K5900 Constipation, unspecified: Secondary | ICD-10-CM | POA: Diagnosis not present

## 2021-11-12 DIAGNOSIS — R600 Localized edema: Secondary | ICD-10-CM | POA: Diagnosis not present

## 2021-11-19 DIAGNOSIS — I480 Paroxysmal atrial fibrillation: Secondary | ICD-10-CM | POA: Diagnosis not present

## 2021-11-19 DIAGNOSIS — I10 Essential (primary) hypertension: Secondary | ICD-10-CM | POA: Diagnosis not present

## 2021-11-19 DIAGNOSIS — Z951 Presence of aortocoronary bypass graft: Secondary | ICD-10-CM | POA: Diagnosis not present

## 2021-11-19 DIAGNOSIS — N390 Urinary tract infection, site not specified: Secondary | ICD-10-CM | POA: Diagnosis not present

## 2021-11-19 DIAGNOSIS — R2689 Other abnormalities of gait and mobility: Secondary | ICD-10-CM | POA: Diagnosis not present

## 2021-11-19 DIAGNOSIS — I2510 Atherosclerotic heart disease of native coronary artery without angina pectoris: Secondary | ICD-10-CM | POA: Diagnosis not present

## 2021-11-19 DIAGNOSIS — D649 Anemia, unspecified: Secondary | ICD-10-CM | POA: Diagnosis not present

## 2021-11-19 DIAGNOSIS — S82841D Displaced bimalleolar fracture of right lower leg, subsequent encounter for closed fracture with routine healing: Secondary | ICD-10-CM | POA: Diagnosis not present

## 2021-11-19 DIAGNOSIS — E785 Hyperlipidemia, unspecified: Secondary | ICD-10-CM | POA: Diagnosis not present

## 2021-11-23 DIAGNOSIS — S8254XD Nondisplaced fracture of medial malleolus of right tibia, subsequent encounter for closed fracture with routine healing: Secondary | ICD-10-CM | POA: Diagnosis not present

## 2021-11-26 DIAGNOSIS — N390 Urinary tract infection, site not specified: Secondary | ICD-10-CM | POA: Diagnosis not present

## 2021-11-26 DIAGNOSIS — S82841D Displaced bimalleolar fracture of right lower leg, subsequent encounter for closed fracture with routine healing: Secondary | ICD-10-CM | POA: Diagnosis not present

## 2021-11-26 DIAGNOSIS — Z951 Presence of aortocoronary bypass graft: Secondary | ICD-10-CM | POA: Diagnosis not present

## 2021-11-26 DIAGNOSIS — I480 Paroxysmal atrial fibrillation: Secondary | ICD-10-CM | POA: Diagnosis not present

## 2021-11-26 DIAGNOSIS — D649 Anemia, unspecified: Secondary | ICD-10-CM | POA: Diagnosis not present

## 2021-11-26 DIAGNOSIS — E785 Hyperlipidemia, unspecified: Secondary | ICD-10-CM | POA: Diagnosis not present

## 2021-11-26 DIAGNOSIS — R2689 Other abnormalities of gait and mobility: Secondary | ICD-10-CM | POA: Diagnosis not present

## 2021-11-26 DIAGNOSIS — I2510 Atherosclerotic heart disease of native coronary artery without angina pectoris: Secondary | ICD-10-CM | POA: Diagnosis not present

## 2021-11-26 DIAGNOSIS — I10 Essential (primary) hypertension: Secondary | ICD-10-CM | POA: Diagnosis not present

## 2021-11-29 DIAGNOSIS — D649 Anemia, unspecified: Secondary | ICD-10-CM | POA: Diagnosis not present

## 2021-11-29 DIAGNOSIS — Z951 Presence of aortocoronary bypass graft: Secondary | ICD-10-CM | POA: Diagnosis not present

## 2021-11-29 DIAGNOSIS — I2510 Atherosclerotic heart disease of native coronary artery without angina pectoris: Secondary | ICD-10-CM | POA: Diagnosis not present

## 2021-11-29 DIAGNOSIS — I480 Paroxysmal atrial fibrillation: Secondary | ICD-10-CM | POA: Diagnosis not present

## 2021-11-29 DIAGNOSIS — E785 Hyperlipidemia, unspecified: Secondary | ICD-10-CM | POA: Diagnosis not present

## 2021-11-29 DIAGNOSIS — R2689 Other abnormalities of gait and mobility: Secondary | ICD-10-CM | POA: Diagnosis not present

## 2021-11-29 DIAGNOSIS — S82841D Displaced bimalleolar fracture of right lower leg, subsequent encounter for closed fracture with routine healing: Secondary | ICD-10-CM | POA: Diagnosis not present

## 2021-11-29 DIAGNOSIS — N390 Urinary tract infection, site not specified: Secondary | ICD-10-CM | POA: Diagnosis not present

## 2021-11-29 DIAGNOSIS — I10 Essential (primary) hypertension: Secondary | ICD-10-CM | POA: Diagnosis not present

## 2021-11-30 ENCOUNTER — Other Ambulatory Visit: Payer: Self-pay | Admitting: Internal Medicine

## 2021-11-30 DIAGNOSIS — S82841D Displaced bimalleolar fracture of right lower leg, subsequent encounter for closed fracture with routine healing: Secondary | ICD-10-CM | POA: Diagnosis not present

## 2021-11-30 DIAGNOSIS — I10 Essential (primary) hypertension: Secondary | ICD-10-CM | POA: Diagnosis not present

## 2021-11-30 DIAGNOSIS — Z951 Presence of aortocoronary bypass graft: Secondary | ICD-10-CM | POA: Diagnosis not present

## 2021-11-30 DIAGNOSIS — N390 Urinary tract infection, site not specified: Secondary | ICD-10-CM | POA: Diagnosis not present

## 2021-11-30 DIAGNOSIS — R2689 Other abnormalities of gait and mobility: Secondary | ICD-10-CM | POA: Diagnosis not present

## 2021-11-30 DIAGNOSIS — I480 Paroxysmal atrial fibrillation: Secondary | ICD-10-CM | POA: Diagnosis not present

## 2021-11-30 DIAGNOSIS — I2510 Atherosclerotic heart disease of native coronary artery without angina pectoris: Secondary | ICD-10-CM | POA: Diagnosis not present

## 2021-11-30 DIAGNOSIS — D649 Anemia, unspecified: Secondary | ICD-10-CM | POA: Diagnosis not present

## 2021-11-30 DIAGNOSIS — E785 Hyperlipidemia, unspecified: Secondary | ICD-10-CM | POA: Diagnosis not present

## 2021-12-03 DIAGNOSIS — D649 Anemia, unspecified: Secondary | ICD-10-CM | POA: Diagnosis not present

## 2021-12-03 DIAGNOSIS — I2510 Atherosclerotic heart disease of native coronary artery without angina pectoris: Secondary | ICD-10-CM | POA: Diagnosis not present

## 2021-12-03 DIAGNOSIS — I480 Paroxysmal atrial fibrillation: Secondary | ICD-10-CM | POA: Diagnosis not present

## 2021-12-03 DIAGNOSIS — I10 Essential (primary) hypertension: Secondary | ICD-10-CM | POA: Diagnosis not present

## 2021-12-03 DIAGNOSIS — N390 Urinary tract infection, site not specified: Secondary | ICD-10-CM | POA: Diagnosis not present

## 2021-12-03 DIAGNOSIS — E785 Hyperlipidemia, unspecified: Secondary | ICD-10-CM | POA: Diagnosis not present

## 2021-12-03 DIAGNOSIS — Z951 Presence of aortocoronary bypass graft: Secondary | ICD-10-CM | POA: Diagnosis not present

## 2021-12-03 DIAGNOSIS — S82841D Displaced bimalleolar fracture of right lower leg, subsequent encounter for closed fracture with routine healing: Secondary | ICD-10-CM | POA: Diagnosis not present

## 2021-12-03 DIAGNOSIS — R2689 Other abnormalities of gait and mobility: Secondary | ICD-10-CM | POA: Diagnosis not present

## 2021-12-05 ENCOUNTER — Other Ambulatory Visit: Payer: Self-pay

## 2021-12-05 ENCOUNTER — Encounter: Payer: Self-pay | Admitting: Internal Medicine

## 2021-12-05 MED ORDER — CARVEDILOL 6.25 MG PO TABS: 6.2500 mg | ORAL_TABLET | Freq: Two times a day (BID) | ORAL | 11 refills | Status: DC

## 2021-12-06 DIAGNOSIS — I480 Paroxysmal atrial fibrillation: Secondary | ICD-10-CM | POA: Diagnosis not present

## 2021-12-06 DIAGNOSIS — Z951 Presence of aortocoronary bypass graft: Secondary | ICD-10-CM | POA: Diagnosis not present

## 2021-12-06 DIAGNOSIS — N390 Urinary tract infection, site not specified: Secondary | ICD-10-CM | POA: Diagnosis not present

## 2021-12-06 DIAGNOSIS — I10 Essential (primary) hypertension: Secondary | ICD-10-CM | POA: Diagnosis not present

## 2021-12-06 DIAGNOSIS — D649 Anemia, unspecified: Secondary | ICD-10-CM | POA: Diagnosis not present

## 2021-12-06 DIAGNOSIS — S82841D Displaced bimalleolar fracture of right lower leg, subsequent encounter for closed fracture with routine healing: Secondary | ICD-10-CM | POA: Diagnosis not present

## 2021-12-06 DIAGNOSIS — E785 Hyperlipidemia, unspecified: Secondary | ICD-10-CM | POA: Diagnosis not present

## 2021-12-06 DIAGNOSIS — R2689 Other abnormalities of gait and mobility: Secondary | ICD-10-CM | POA: Diagnosis not present

## 2021-12-06 DIAGNOSIS — I2510 Atherosclerotic heart disease of native coronary artery without angina pectoris: Secondary | ICD-10-CM | POA: Diagnosis not present

## 2021-12-07 DIAGNOSIS — R2689 Other abnormalities of gait and mobility: Secondary | ICD-10-CM | POA: Diagnosis not present

## 2021-12-07 DIAGNOSIS — I480 Paroxysmal atrial fibrillation: Secondary | ICD-10-CM | POA: Diagnosis not present

## 2021-12-07 DIAGNOSIS — Z951 Presence of aortocoronary bypass graft: Secondary | ICD-10-CM | POA: Diagnosis not present

## 2021-12-07 DIAGNOSIS — E785 Hyperlipidemia, unspecified: Secondary | ICD-10-CM | POA: Diagnosis not present

## 2021-12-07 DIAGNOSIS — N390 Urinary tract infection, site not specified: Secondary | ICD-10-CM | POA: Diagnosis not present

## 2021-12-07 DIAGNOSIS — S82841D Displaced bimalleolar fracture of right lower leg, subsequent encounter for closed fracture with routine healing: Secondary | ICD-10-CM | POA: Diagnosis not present

## 2021-12-07 DIAGNOSIS — D649 Anemia, unspecified: Secondary | ICD-10-CM | POA: Diagnosis not present

## 2021-12-07 DIAGNOSIS — I10 Essential (primary) hypertension: Secondary | ICD-10-CM | POA: Diagnosis not present

## 2021-12-07 DIAGNOSIS — I2510 Atherosclerotic heart disease of native coronary artery without angina pectoris: Secondary | ICD-10-CM | POA: Diagnosis not present

## 2021-12-11 DIAGNOSIS — E785 Hyperlipidemia, unspecified: Secondary | ICD-10-CM | POA: Diagnosis not present

## 2021-12-11 DIAGNOSIS — I480 Paroxysmal atrial fibrillation: Secondary | ICD-10-CM | POA: Diagnosis not present

## 2021-12-11 DIAGNOSIS — S82841D Displaced bimalleolar fracture of right lower leg, subsequent encounter for closed fracture with routine healing: Secondary | ICD-10-CM | POA: Diagnosis not present

## 2021-12-11 DIAGNOSIS — I2510 Atherosclerotic heart disease of native coronary artery without angina pectoris: Secondary | ICD-10-CM | POA: Diagnosis not present

## 2021-12-11 DIAGNOSIS — I10 Essential (primary) hypertension: Secondary | ICD-10-CM | POA: Diagnosis not present

## 2021-12-11 DIAGNOSIS — D649 Anemia, unspecified: Secondary | ICD-10-CM | POA: Diagnosis not present

## 2021-12-11 DIAGNOSIS — Z951 Presence of aortocoronary bypass graft: Secondary | ICD-10-CM | POA: Diagnosis not present

## 2021-12-11 DIAGNOSIS — R2689 Other abnormalities of gait and mobility: Secondary | ICD-10-CM | POA: Diagnosis not present

## 2021-12-11 DIAGNOSIS — N390 Urinary tract infection, site not specified: Secondary | ICD-10-CM | POA: Diagnosis not present

## 2021-12-14 DIAGNOSIS — E785 Hyperlipidemia, unspecified: Secondary | ICD-10-CM | POA: Diagnosis not present

## 2021-12-14 DIAGNOSIS — I10 Essential (primary) hypertension: Secondary | ICD-10-CM | POA: Diagnosis not present

## 2021-12-14 DIAGNOSIS — I480 Paroxysmal atrial fibrillation: Secondary | ICD-10-CM | POA: Diagnosis not present

## 2021-12-14 DIAGNOSIS — Z951 Presence of aortocoronary bypass graft: Secondary | ICD-10-CM | POA: Diagnosis not present

## 2021-12-14 DIAGNOSIS — D649 Anemia, unspecified: Secondary | ICD-10-CM | POA: Diagnosis not present

## 2021-12-14 DIAGNOSIS — I2510 Atherosclerotic heart disease of native coronary artery without angina pectoris: Secondary | ICD-10-CM | POA: Diagnosis not present

## 2021-12-14 DIAGNOSIS — S82841D Displaced bimalleolar fracture of right lower leg, subsequent encounter for closed fracture with routine healing: Secondary | ICD-10-CM | POA: Diagnosis not present

## 2021-12-14 DIAGNOSIS — R2689 Other abnormalities of gait and mobility: Secondary | ICD-10-CM | POA: Diagnosis not present

## 2021-12-14 DIAGNOSIS — N390 Urinary tract infection, site not specified: Secondary | ICD-10-CM | POA: Diagnosis not present

## 2021-12-17 DIAGNOSIS — Z951 Presence of aortocoronary bypass graft: Secondary | ICD-10-CM | POA: Diagnosis not present

## 2021-12-17 DIAGNOSIS — I10 Essential (primary) hypertension: Secondary | ICD-10-CM | POA: Diagnosis not present

## 2021-12-17 DIAGNOSIS — N390 Urinary tract infection, site not specified: Secondary | ICD-10-CM | POA: Diagnosis not present

## 2021-12-17 DIAGNOSIS — R2689 Other abnormalities of gait and mobility: Secondary | ICD-10-CM | POA: Diagnosis not present

## 2021-12-17 DIAGNOSIS — S82841D Displaced bimalleolar fracture of right lower leg, subsequent encounter for closed fracture with routine healing: Secondary | ICD-10-CM | POA: Diagnosis not present

## 2021-12-17 DIAGNOSIS — I480 Paroxysmal atrial fibrillation: Secondary | ICD-10-CM | POA: Diagnosis not present

## 2021-12-17 DIAGNOSIS — E785 Hyperlipidemia, unspecified: Secondary | ICD-10-CM | POA: Diagnosis not present

## 2021-12-17 DIAGNOSIS — I2510 Atherosclerotic heart disease of native coronary artery without angina pectoris: Secondary | ICD-10-CM | POA: Diagnosis not present

## 2021-12-17 DIAGNOSIS — D649 Anemia, unspecified: Secondary | ICD-10-CM | POA: Diagnosis not present

## 2021-12-20 DIAGNOSIS — Z951 Presence of aortocoronary bypass graft: Secondary | ICD-10-CM | POA: Diagnosis not present

## 2021-12-20 DIAGNOSIS — I480 Paroxysmal atrial fibrillation: Secondary | ICD-10-CM | POA: Diagnosis not present

## 2021-12-20 DIAGNOSIS — R2689 Other abnormalities of gait and mobility: Secondary | ICD-10-CM | POA: Diagnosis not present

## 2021-12-20 DIAGNOSIS — I10 Essential (primary) hypertension: Secondary | ICD-10-CM | POA: Diagnosis not present

## 2021-12-20 DIAGNOSIS — I2510 Atherosclerotic heart disease of native coronary artery without angina pectoris: Secondary | ICD-10-CM | POA: Diagnosis not present

## 2021-12-20 DIAGNOSIS — S82841D Displaced bimalleolar fracture of right lower leg, subsequent encounter for closed fracture with routine healing: Secondary | ICD-10-CM | POA: Diagnosis not present

## 2021-12-20 DIAGNOSIS — N390 Urinary tract infection, site not specified: Secondary | ICD-10-CM | POA: Diagnosis not present

## 2021-12-20 DIAGNOSIS — E785 Hyperlipidemia, unspecified: Secondary | ICD-10-CM | POA: Diagnosis not present

## 2021-12-20 DIAGNOSIS — D649 Anemia, unspecified: Secondary | ICD-10-CM | POA: Diagnosis not present

## 2021-12-21 DIAGNOSIS — S8254XD Nondisplaced fracture of medial malleolus of right tibia, subsequent encounter for closed fracture with routine healing: Secondary | ICD-10-CM | POA: Diagnosis not present

## 2021-12-25 DIAGNOSIS — I2510 Atherosclerotic heart disease of native coronary artery without angina pectoris: Secondary | ICD-10-CM | POA: Diagnosis not present

## 2021-12-25 DIAGNOSIS — I480 Paroxysmal atrial fibrillation: Secondary | ICD-10-CM | POA: Diagnosis not present

## 2021-12-25 DIAGNOSIS — R2689 Other abnormalities of gait and mobility: Secondary | ICD-10-CM | POA: Diagnosis not present

## 2021-12-25 DIAGNOSIS — D649 Anemia, unspecified: Secondary | ICD-10-CM | POA: Diagnosis not present

## 2021-12-25 DIAGNOSIS — E785 Hyperlipidemia, unspecified: Secondary | ICD-10-CM | POA: Diagnosis not present

## 2021-12-25 DIAGNOSIS — S82841D Displaced bimalleolar fracture of right lower leg, subsequent encounter for closed fracture with routine healing: Secondary | ICD-10-CM | POA: Diagnosis not present

## 2021-12-25 DIAGNOSIS — N390 Urinary tract infection, site not specified: Secondary | ICD-10-CM | POA: Diagnosis not present

## 2021-12-25 DIAGNOSIS — Z951 Presence of aortocoronary bypass graft: Secondary | ICD-10-CM | POA: Diagnosis not present

## 2021-12-25 DIAGNOSIS — I10 Essential (primary) hypertension: Secondary | ICD-10-CM | POA: Diagnosis not present

## 2021-12-25 MED ORDER — CLOPIDOGREL BISULFATE 75 MG PO TABS: 75.0000 mg | ORAL_TABLET | Freq: Every day | ORAL | 3 refills | Status: DC

## 2022-01-10 ENCOUNTER — Ambulatory Visit: Payer: Medicare Other | Admitting: Internal Medicine

## 2022-01-30 DIAGNOSIS — H353132 Nonexudative age-related macular degeneration, bilateral, intermediate dry stage: Secondary | ICD-10-CM | POA: Diagnosis not present

## 2022-02-13 ENCOUNTER — Encounter: Payer: Self-pay | Admitting: Internal Medicine

## 2022-02-13 ENCOUNTER — Ambulatory Visit: Payer: Medicare Other | Attending: Internal Medicine | Admitting: Internal Medicine

## 2022-02-13 VITALS — BP 124/70 | HR 84 | Ht 64.0 in | Wt 177.4 lb

## 2022-02-13 DIAGNOSIS — I447 Left bundle-branch block, unspecified: Secondary | ICD-10-CM

## 2022-02-13 DIAGNOSIS — Z951 Presence of aortocoronary bypass graft: Secondary | ICD-10-CM

## 2022-02-13 DIAGNOSIS — Z8679 Personal history of other diseases of the circulatory system: Secondary | ICD-10-CM

## 2022-02-13 DIAGNOSIS — I2510 Atherosclerotic heart disease of native coronary artery without angina pectoris: Secondary | ICD-10-CM

## 2022-02-13 DIAGNOSIS — I253 Aneurysm of heart: Secondary | ICD-10-CM

## 2022-02-13 DIAGNOSIS — I10 Essential (primary) hypertension: Secondary | ICD-10-CM | POA: Diagnosis not present

## 2022-02-13 DIAGNOSIS — I63342 Cerebral infarction due to thrombosis of left cerebellar artery: Secondary | ICD-10-CM | POA: Diagnosis not present

## 2022-02-13 DIAGNOSIS — E785 Hyperlipidemia, unspecified: Secondary | ICD-10-CM

## 2022-02-13 NOTE — Progress Notes (Signed)
Cardiology Office Note:    Date:  02/13/2022  ID:  [name], DOB 09/06/1940, MRN [id number]  PCP:  Jamie Harvest, PA-C  Cardiologist:  Jamie Munroe, MD  Electrophysiologist:  None   Referring MD: [facility]*   Chief Complaint/Reason for Referral: CAD s/p CABG  History of Present Illness:    Jamie Hodge is a 82 y.o. 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 CCTA showed severe CAD, and after cath she was referred for CABG for multivessel CAD. She underwent CABG on 11/29/19.   At a prior visit she reported she did not tolerate carvedilol 12.5 mg BID, dose reduced to 6.25 mg BID. Stopped aspirin due to nosebleeds per neurology notes. Nosebleeding subsided with use of teabag. She went back on aspirin and continued to take Plavix for dual antiplatelet therapy.  Per Dr. Orvan Hodge prior note, she was to continue Plavix indefinitely, though somewhat unclear if aspirin was intended to be continued as well.  We continued her on DAPT with no significant issues. She had Hodge neurology who felt she was stable from a neurologic perspective post stroke.  No more routine follow-up with cardiac surgery. BP had been somewhat variable but no worrisome high blood pressures at the time. However, there was new LBBB on ECG. Echocardiogram was performed to evaluate with no worrisome findings. We attempted a PET MPI and this was truncated due to patient intolerance of regadenason stress agent. She had an episode of concerning hematemesis and at that point DAPT was discontinued (10/20/21), and she has been continued on monotherapy with plavix 75 mg daily.   Today, she presents overall feeling well. She is compliant with Amlodipine 2.5 mg, Atorvastatin 80 mg, Coreg 6.25 mg, Plavix 75 mg, and Valsartan 160 mg.   Her cholesterol levels were within  normal limits 2 years ago. Her hemoglobin was 8.7 at hosp dismissal in Oct '23. She will be getting blood work in February and will send it in then.   We discussed her EKG with occasional PVCs. Her blood pressure was 124/70 in clinic. LBBB has resolved. Intermittent.  She had gastric bleeding episodes about 5 months ago that has since resolved. She has been feeling very well.  She also recently broken her ankle. She did rehab and has been walking on her own without the assistance of her walker since early December.  She reports that she had a bad cold last month where she experienced a bit of shortness of breath.   She denies any palpitations, chest pain, shortness of breath, or peripheral edema. No lightheadedness, headaches, syncope, orthopnea, or PND.  Past Medical History:  Diagnosis Date   Coronary artery disease    CVA (cerebral vascular accident) ([place])    Herpes zoster 2012   HERPES ZOSTER 08/13/2010   Qualifier: Diagnosis of  By: Koleen Nimrod MD, [place]     Stroke [facility]) 09/25/2019   TIA (transient ischemic attack) 09/23/2019   Vertigo     Past Surgical History:  Procedure Laterality Date   BIOPSY  10/22/2021   Procedure: BIOPSY;  Surgeon: [name], MD;  Location: [facility];  Service: Gastroenterology;;   CARDIAC CATHETERIZATION     CORONARY ARTERY BYPASS GRAFT N/A 11/29/2019   Procedure: CORONARY ARTERY BYPASS GRAFTING (CABG) x 6 ON PUMP. USING LEFT INTERNAL MAMMARY ARTERY, LEFT RADIAL ARTERY AND RIGHT GREATER SAPHENOUS VEIN.;  Surgeon: Fredrich Romans  Z, MD;  Location: [place];  Service: Open Heart Surgery;  Laterality: N/A;   ENDOVEIN Hodge OF GREATER SAPHENOUS VEIN Right 11/29/2019   Procedure: ENDOVEIN Hodge OF GREATER SAPHENOUS VEIN;  Surgeon: Wonda Olds, MD;  Location: [facility];  Service: Open Heart Surgery;  Laterality: Right;   ESOPHAGOGASTRODUODENOSCOPY N/A 10/22/2021   Procedure: ESOPHAGOGASTRODUODENOSCOPY (EGD);  Surgeon: [name], MD;  Location: [facility];   Service: Gastroenterology;  Laterality: N/A;   HEMOSTASIS CLIP PLACEMENT  10/22/2021   Procedure: HEMOSTASIS CLIP PLACEMENT;  Surgeon: [name], MD;  Location: [facility];  Service: Gastroenterology;;   LEFT HEART CATH AND CORONARY ANGIOGRAPHY N/A 11/23/2019   Procedure: LEFT HEART CATH AND CORONARY ANGIOGRAPHY;  Surgeon: Jettie Booze, MD;  Location: [facility];  Service: Cardiovascular;  Laterality: N/A;   RADIAL ARTERY Hodge Left 11/29/2019   Procedure: RADIAL ARTERY Hodge;  Surgeon: Wonda Olds, MD;  Location: [facility];  Service: Open Heart Surgery;  Laterality: Left;   RHINOPLASTY     TEE WITHOUT CARDIOVERSION N/A 11/29/2019   Procedure: TRANSESOPHAGEAL ECHOCARDIOGRAM (TEE);  Surgeon: Wonda Olds, MD;  Location: [place];  Service: Open Heart Surgery;  Laterality: N/A;    Current Medications: Current Meds  Medication Sig   amitriptyline (ELAVIL) 10 MG tablet TAKE 1 TABLET BY MOUTH EVERYDAY AT BEDTIME (Patient taking differently: Take 10 mg by mouth at bedtime.)   amLODipine (NORVASC) 2.5 MG tablet Take 2.5 mg by mouth daily.   atorvastatin (LIPITOR) 80 MG tablet TAKE 1 TABLET BY MOUTH EVERY DAY (Patient taking differently: Take 80 mg by mouth daily.)   carvedilol (COREG) 6.25 MG tablet Take 1 tablet (6.25 mg total) by mouth 2 (two) times daily.   clopidogrel (PLAVIX) 75 MG tablet Take 1 tablet (75 mg total) by mouth daily.   gabapentin (NEURONTIN) 300 MG capsule Take 900 mg by mouth at bedtime.   Multiple Vitamins-Minerals (PRESERVISION AREDS 2+MULTI VIT PO) Take 2 tablets by mouth daily.   pantoprazole (PROTONIX) 40 MG tablet Take 1 tablet (40 mg total) by mouth 2 (two) times daily before a meal.   sertraline (ZOLOFT) 50 MG tablet Take 100 mg by mouth daily.   triamcinolone (NASACORT ALLERGY 24HR) 55 MCG/ACT AERO nasal inhaler Place 2 sprays into the nose daily as needed (sinus congestion).   valsartan (DIOVAN) 160 MG tablet Take 1 tablet (160 mg total) by  mouth daily.  Reviewed in EMR, unable to pull in to chart.   Allergies:   Mango flavor and Contrast media [iodinated contrast media]   Social History   Tobacco Use   Smoking status: Never   Smokeless tobacco: Never  Vaping Use   Vaping Use: Never used  Substance Use Topics   Alcohol use: No   Drug use: No     Family History: The patient's family history includes CAD (age of onset: 68) in her father; Lung cancer (age of onset: 17) in her father; Unexplained death in her mother.  ROS:   Please see the history of present illness.     All other systems reviewed and are negative.  EKGs/Labs/Other Studies Reviewed:    The following studies were reviewed today:  EKG: EKG is personally reviewed. 02/13/2022: Sinus rhythm. Rate 84 bpm. PVCs. Nonspecific T wave abnormalities. 06/27/2021: NSR, LBBB.  Recent Labs: 10/25/2021: ALT 15; Magnesium 1.8 10/26/2021: BUN 11; Creatinine, Ser 0.66; Hemoglobin 8.7; Platelets 357; Potassium 3.7; Sodium 140  Recent Lipid Panel    Component Value Date/Time  CHOL 62 11/30/2019 0459   TRIG 68 11/30/2019 0459   HDL 20 (L) 11/30/2019 0459   CHOLHDL 3.1 11/30/2019 0459   VLDL 14 11/30/2019 0459   LDLCALC 28 11/30/2019 0459    Physical Exam:    VS:  BP 124/70   Pulse 84   Ht 5' 4"$  (1.626 m)   Wt 177 lb 6.4 oz (80.5 kg)   SpO2 93%   BMI 30.45 kg/m     Wt Readings from Last 5 Encounters:  02/13/22 177 lb 6.4 oz (80.5 kg)  10/23/21 194 lb 7.1 oz (88.2 kg)  06/27/21 191 lb (86.6 kg)  05/15/21 196 lb (88.9 kg)  02/21/21 201 lb (91.2 kg)    Constitutional: No acute distress Eyes: sclera non-icteric, normal conjunctiva and lids ENMT: normal dentition, moist mucous membranes Cardiovascular:  regular rhythm, normal rate, no murmurs. S1 and S2 normal. No jugular venous distention.  Respiratory: clear to auscultation bilaterally GI : normal bowel sounds, soft and nontender. No distention.   MSK: extremities warm, well perfused. No edema.   NEURO: grossly nonfocal exam, moves all extremities. PSYCH: alert and oriented x 3, normal mood and affect.    ASSESSMENT:    1. Coronary artery disease involving native coronary artery of native heart without angina pectoris   2. New onset left bundle branch block (LBBB)    PLAN:    New LBBB -unclear etiology. Echo ok. PET MPI study truncated due to patient intolerance of regadenason stress agent. Offered alternate stress testing including SPECT MPI. She declines. LBBB resolved today, seems intermittent. Query progressing conduction system disease.   Coronary artery disease involving native coronary artery of native heart without angina pectoris Left ventricular apical aneurysm S/P CABG x 6  - no longer on DAPT due to bleeding, continue clopidogrel 75 mg daily. - continue carvedilol 6.25 mg twice daily and atorvastatin 80 mg daily.  Primary hypertension Medication management -continue amlodipine 2.5 mg daily, valsartan 160 mg daily, and carvedilol 6.25 mg BID.   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 Can continue plavix, does not need dapt for stroke.    Total time of encounter: 30 minutes total time of encounter, including 20 minutes spent in face-to-face patient care on the date of this encounter. This time includes coordination of care and counseling regarding above mentioned problem list. Remainder of non-face-to-face time involved reviewing chart documents/testing relevant to the patient encounter and documentation in the medical record. I have independently reviewed documentation from referring provider.   Cherlynn Kaiser, MD, [facility]    Medication Adjustments/Labs and Tests Ordered: Current medicines are reviewed at length with the patient today.  Concerns regarding medicines are outlined above.    Orders Placed This Encounter  Procedures   EKG 12-Lead   No orders of the defined types were placed in this encounter.  Patient Instructions  Medication Instructions:  No changes *If you need a refill on your cardiac medications before your next appointment, please call your pharmacy*   Lab Work: None If you have labs (blood work) drawn today and your tests are completely normal, you will receive your results only by: [place] (if you have MyChart) OR A paper copy in the mail If you have any lab test that is abnormal or we need to change your treatment, we will call you to review the results.  Testing/Procedures: None   Follow-Up: At [facility], you and your health needs are our priority.  As part of our continuing mission to provide you with exceptional heart care, we have created designated Provider Care Teams.  These Care Teams include your primary Cardiologist (physician) and Advanced Practice Providers (APPs -  Physician Assistants and Nurse Practitioners) who all work together to provide you with the care you need, when you need it.  We recommend signing up for the patient portal called "MyChart".  Sign up information is provided on this After Visit Summary.  MyChart is used to connect with patients for Virtual Visits (Telemedicine).  Patients are able to view lab/test results, encounter notes, upcoming appointments, etc.  Non-urgent messages can be sent to your provider as well.   To learn more about what you can do with MyChart, go to [email or web address].    Your next appointment:   6 month(s)  Provider:   Elouise Munroe, MD       I,Rachel Rivera,acting as a scribe for Jamie Munroe, MD.,have documented all relevant documentation on the behalf of Jamie Munroe, MD,as directed by  Jamie Munroe, MD while in the presence of Jamie Munroe, MD.  I, Jamie Munroe, MD, have reviewed all documentation for the visit on  02/13/2022. The documentation on today's date of service for the exam, diagnosis, procedures, and orders are all accurate and complete.

## 2022-02-13 NOTE — Patient Instructions (Signed)
Medication Instructions:  No changes *If you need a refill on your cardiac medications before your next appointment, please call your pharmacy*   Lab Work: None If you have labs (blood work) drawn today and your tests are completely normal, you will receive your results only by: [place] (if you have MyChart) OR A paper copy in the mail If you have any lab test that is abnormal or we need to change your treatment, we will call you to review the results.   Testing/Procedures: None   Follow-Up: At [facility], you and your health needs are our priority.  As part of our continuing mission to provide you with exceptional heart care, we have created designated Provider Care Teams.  These Care Teams include your primary Cardiologist (physician) and Advanced Practice Providers (APPs -  Physician Assistants and Nurse Practitioners) who all work together to provide you with the care you need, when you need it.  We recommend signing up for the patient portal called "MyChart".  Sign up information is provided on this After Visit Summary.  MyChart is used to connect with patients for Virtual Visits (Telemedicine).  Patients are able to view lab/test results, encounter notes, upcoming appointments, etc.  Non-urgent messages can be sent to your provider as well.   To learn more about what you can do with MyChart, go to [email or web address].    Your next appointment:   6 month(s)  Provider:   Elouise Munroe, MD

## 2022-02-14 DIAGNOSIS — H43813 Vitreous degeneration, bilateral: Secondary | ICD-10-CM | POA: Diagnosis not present

## 2022-02-14 DIAGNOSIS — H353123 Nonexudative age-related macular degeneration, left eye, advanced atrophic without subfoveal involvement: Secondary | ICD-10-CM | POA: Diagnosis not present

## 2022-02-14 DIAGNOSIS — H353112 Nonexudative age-related macular degeneration, right eye, intermediate dry stage: Secondary | ICD-10-CM | POA: Diagnosis not present

## 2022-03-21 DIAGNOSIS — I10 Essential (primary) hypertension: Secondary | ICD-10-CM | POA: Diagnosis not present

## 2022-03-21 DIAGNOSIS — E782 Mixed hyperlipidemia: Secondary | ICD-10-CM | POA: Diagnosis not present

## 2022-03-21 DIAGNOSIS — I700 Atherosclerosis of aorta: Secondary | ICD-10-CM | POA: Diagnosis not present

## 2022-03-21 DIAGNOSIS — Z951 Presence of aortocoronary bypass graft: Secondary | ICD-10-CM | POA: Diagnosis not present

## 2022-03-21 DIAGNOSIS — D6869 Other thrombophilia: Secondary | ICD-10-CM | POA: Diagnosis not present

## 2022-03-21 DIAGNOSIS — R7309 Other abnormal glucose: Secondary | ICD-10-CM | POA: Diagnosis not present

## 2022-03-21 DIAGNOSIS — Z0000 Encounter for general adult medical examination without abnormal findings: Secondary | ICD-10-CM | POA: Diagnosis not present

## 2022-03-21 DIAGNOSIS — I4811 Longstanding persistent atrial fibrillation: Secondary | ICD-10-CM | POA: Diagnosis not present

## 2022-03-21 DIAGNOSIS — G43009 Migraine without aura, not intractable, without status migrainosus: Secondary | ICD-10-CM | POA: Diagnosis not present

## 2022-03-21 DIAGNOSIS — Z8673 Personal history of transient ischemic attack (TIA), and cerebral infarction without residual deficits: Secondary | ICD-10-CM | POA: Diagnosis not present

## 2022-03-21 DIAGNOSIS — Z13820 Encounter for screening for osteoporosis: Secondary | ICD-10-CM | POA: Diagnosis not present

## 2022-03-21 DIAGNOSIS — I2510 Atherosclerotic heart disease of native coronary artery without angina pectoris: Secondary | ICD-10-CM | POA: Diagnosis not present

## 2022-04-16 ENCOUNTER — Other Ambulatory Visit: Payer: Self-pay | Admitting: Family Medicine

## 2022-04-16 DIAGNOSIS — Z13820 Encounter for screening for osteoporosis: Secondary | ICD-10-CM

## 2022-04-16 DIAGNOSIS — Z780 Asymptomatic menopausal state: Secondary | ICD-10-CM

## 2022-07-01 DIAGNOSIS — Z951 Presence of aortocoronary bypass graft: Secondary | ICD-10-CM

## 2022-07-01 DIAGNOSIS — E785 Hyperlipidemia, unspecified: Secondary | ICD-10-CM

## 2022-07-08 NOTE — Telephone Encounter (Signed)
Lipid Panel w/reflex Reviewed date:03/25/2022 04:51:59 PM Interpretation:Slightly elevated Performing Lab: Notes/Report: Testing Performed at: [facility], [address], [address], [place], [place] [address]  Cholesterol 186 <200 mg/dL    CHOL/HDL 4.1 1.9-1.4 Ratio    HDLD 46 30-85 mg/dL Values below 40 mg/dL indicate increased risk factor  Triglyceride 182 0-199 mg/dL    NHDL 782 9-562 mg/dL Range dependent upon risk factors.  LDL Chol Calc (NIH) 109 0-99 mg/dL

## 2022-08-01 ENCOUNTER — Ambulatory Visit: Payer: Medicare Other | Attending: Internal Medicine | Admitting: Pharmacist

## 2022-08-01 ENCOUNTER — Encounter: Payer: Self-pay | Admitting: Pharmacist

## 2022-08-01 DIAGNOSIS — Z951 Presence of aortocoronary bypass graft: Secondary | ICD-10-CM | POA: Diagnosis not present

## 2022-08-01 DIAGNOSIS — E785 Hyperlipidemia, unspecified: Secondary | ICD-10-CM | POA: Diagnosis not present

## 2022-08-01 DIAGNOSIS — I2510 Atherosclerotic heart disease of native coronary artery without angina pectoris: Secondary | ICD-10-CM | POA: Diagnosis not present

## 2022-08-01 MED ORDER — EZETIMIBE 10 MG PO TABS: 10.0000 mg | ORAL_TABLET | Freq: Every day | ORAL | 1 refills | Status: DC

## 2022-08-01 NOTE — Patient Instructions (Addendum)
It was nice meeting you today  We would like your LDL (bad cholesterol) to be less than 70  We will start a new medication called ezetimibe 10mg  once daily  We will recheck your cholesterol in about 2-3 months  Please call or message [place] with any questions or concerns  Follow up with Dr Jacques Navy in 2 weeks  Laural Golden, PharmD, BCACP, CDCES, CPP [address], [address] [place], [place], [address] Phone: [phone], Fax: [phone]

## 2022-08-01 NOTE — Progress Notes (Signed)
Patient ID: Jamie Hodge                 DOB: April 23, 1940                    MRN: [id number]     HPI: Jamie Hodge is a 82 y.o. female patient referred to lipid clinic by Dr Jamie Hodge. PMH is significant for CAD, CABG, and CAD. PCP recently ran lab work which showed LDL has increased and patient was referred to lipid clinic.  Patient presents today in good spirits. Currently managed on atorvastatin 80 with no patient reported adverse effects.  Recent lipid panel from PCP showed LDL has increased to 109.   Denies SOB or chest pain. Overall feels well.  Current Medications: Atorvastatin 80mg  daily  Intolerances: N/A  Risk Factors:  CAD Hx of CABG  LDL goal: <70  Labs: TC 186, HDL 46, Trigs 182, LDL 109 (03/25/22 on atorvastatin 80)  Past Medical History:  Diagnosis Date   Coronary artery disease    CVA (cerebral vascular accident) ([facility])    Herpes zoster 2012   HERPES ZOSTER 08/13/2010   Qualifier: Diagnosis of  By: Orson Aloe MD, Tinnie Gens     Stroke [facility]) 09/25/2019   TIA (transient ischemic attack) 09/23/2019   Vertigo     Current Outpatient Medications on File Prior to Visit  Medication Sig Dispense Refill   amitriptyline (ELAVIL) 10 MG tablet TAKE 1 TABLET BY MOUTH EVERYDAY AT BEDTIME (Patient taking differently: Take 10 mg by mouth at bedtime.) 90 tablet 3   amLODipine (NORVASC) 2.5 MG tablet Take 2.5 mg by mouth daily.     atorvastatin (LIPITOR) 80 MG tablet TAKE 1 TABLET BY MOUTH EVERY DAY (Patient taking differently: Take 80 mg by mouth daily.) 90 tablet 3   carvedilol (COREG) 6.25 MG tablet Take 1 tablet (6.25 mg total) by mouth 2 (two) times daily. 60 tablet 11   clopidogrel (PLAVIX) 75 MG tablet Take 1 tablet (75 mg total) by mouth daily. 90 tablet 3   gabapentin (NEURONTIN) 300 MG capsule Take 900 mg by mouth at bedtime.     Multiple Vitamins-Minerals (PRESERVISION AREDS 2+MULTI VIT PO) Take 2 tablets by mouth daily.     pantoprazole (PROTONIX) 40 MG tablet  Take 1 tablet (40 mg total) by mouth 2 (two) times daily before a meal. 60 tablet 2   sertraline (ZOLOFT) 50 MG tablet Take 100 mg by mouth daily.     triamcinolone (NASACORT ALLERGY 24HR) 55 MCG/ACT AERO nasal inhaler Place 2 sprays into the nose daily as needed (sinus congestion).     valsartan (DIOVAN) 160 MG tablet Take 1 tablet (160 mg total) by mouth daily. 90 tablet 3   [DISCONTINUED] isosorbide mononitrate (IMDUR) 30 MG 24 hr tablet Take 1 tablet (30 mg total) by mouth daily. 30 tablet 0   No current facility-administered medications on file prior to visit.    Allergies  Allergen Reactions   Mango Flavor Anaphylaxis   Contrast Media [Iodinated Contrast Media] Other (See Comments)    Unknown reaction    Assessment/Plan:  1. Hyperlipidemia - Patient LDL 109 which is above goal of <70. Discussed next options with patient such as PCSK9i, Leqvio, and Zetia.  Discussed mechanism of action of PCSK9i. Using demo pen, educated patient on storage, site selection, administration, and possible adverse effects. Patient very hesitant to self administer injections and would prefer to try oral option first.   Will start Zetia 10mg  once daily.  Discussed administration and possible adverse effects. Recheck lipid panel and LFTs in 2-3 months. Follow up with Dr Jamie Hodge in 2 weeks.  Continue atorvastatin 80mg  daily Start Zetia 10mg  daily Recheck lipid and hepatic panel in 2-3 months Follow up with Dr Jamie Hodge in 2 weeks  Laural Golden, PharmD, BCACP, CDCES, CPP [address], [address] [place], [place], [address] Phone: [phone], Fax: [phone]

## 2022-08-03 ENCOUNTER — Other Ambulatory Visit: Payer: Self-pay | Admitting: Internal Medicine

## 2022-08-03 ENCOUNTER — Encounter: Payer: Self-pay | Admitting: Internal Medicine

## 2022-08-05 MED ORDER — VALSARTAN 160 MG PO TABS: 160.0000 mg | ORAL_TABLET | Freq: Every day | ORAL | 1 refills | Status: DC

## 2022-08-14 DIAGNOSIS — H353132 Nonexudative age-related macular degeneration, bilateral, intermediate dry stage: Secondary | ICD-10-CM | POA: Diagnosis not present

## 2022-08-16 ENCOUNTER — Encounter: Payer: Self-pay | Admitting: Internal Medicine

## 2022-08-16 ENCOUNTER — Ambulatory Visit: Payer: Medicare Other | Attending: Internal Medicine | Admitting: Internal Medicine

## 2022-08-16 VITALS — BP 106/66 | HR 81 | Ht 64.0 in | Wt 178.0 lb

## 2022-08-16 DIAGNOSIS — I2510 Atherosclerotic heart disease of native coronary artery without angina pectoris: Secondary | ICD-10-CM

## 2022-08-16 DIAGNOSIS — E785 Hyperlipidemia, unspecified: Secondary | ICD-10-CM

## 2022-08-16 DIAGNOSIS — Z8679 Personal history of other diseases of the circulatory system: Secondary | ICD-10-CM

## 2022-08-16 DIAGNOSIS — Z951 Presence of aortocoronary bypass graft: Secondary | ICD-10-CM | POA: Diagnosis not present

## 2022-08-16 DIAGNOSIS — I10 Essential (primary) hypertension: Secondary | ICD-10-CM

## 2022-08-16 DIAGNOSIS — I447 Left bundle-branch block, unspecified: Secondary | ICD-10-CM

## 2022-08-16 DIAGNOSIS — I63342 Cerebral infarction due to thrombosis of left cerebellar artery: Secondary | ICD-10-CM | POA: Diagnosis not present

## 2022-08-16 DIAGNOSIS — I253 Aneurysm of heart: Secondary | ICD-10-CM

## 2022-08-16 NOTE — Patient Instructions (Signed)
Medication Instructions:  Your physician recommends that you continue on your current medications as directed. Please refer to the Current Medication list given to you today.  *If you need a refill on your cardiac medications before your next appointment, please call your pharmacy*   Lab Work: Your physician recommends that you return for lab work in: 1 month for FASTING lipid/liver & A1C  If you have labs (blood work) drawn today and your tests are completely normal, you will receive your results only by: MyChart Message (if you have MyChart) OR A paper copy in the mail If you have any lab test that is abnormal or we need to change your treatment, we will call you to review the results.   Follow-Up: At [facility], you and your health needs are our priority.  As part of our continuing mission to provide you with exceptional heart care, we have created designated Provider Care Teams.  These Care Teams include your primary Cardiologist (physician) and Advanced Practice Providers (APPs -  Physician Assistants and Nurse Practitioners) who all work together to provide you with the care you need, when you need it.  We recommend signing up for the patient portal called "MyChart".  Sign up information is provided on this After Visit Summary.  MyChart is used to connect with patients for Virtual Visits (Telemedicine).  Patients are able to view lab/test results, encounter notes, upcoming appointments, etc.  Non-urgent messages can be sent to your provider as well.   To learn more about what you can do with MyChart, go to [email or web address].    Your next appointment:   6 month(s)  Provider:   Parke Poisson, MD

## 2022-08-16 NOTE — Progress Notes (Unsigned)
Cardiology Office Note:    Date:  08/16/2022  ID:  Jamie Hodge, DOB 1940/03/18, MRN [id number]  PCP:  Sheliah Hatch, PA-C  Cardiologist:  Parke Poisson, MD  Electrophysiologist:  None   Referring MD: Sheliah Hatch, PA-C   Chief Complaint/Reason for Referral: CAD s/p CABG  History of Present Illness:    Jamie Hodge is a 82 y.o. 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 CCTA showed severe CAD, and after cath she was referred for CABG for multivessel CAD. She underwent CABG on 11/29/19.   At a prior visit she reported she did not tolerate carvedilol 12.5 mg BID, dose reduced to 6.25 mg BID. Stopped aspirin due to nosebleeds per neurology notes. Nosebleeding subsided with use of teabag. She went back on aspirin and continued to take Plavix for dual antiplatelet therapy.  Per Dr. Vickey Sages prior note, she was to continue Plavix indefinitely, though somewhat unclear if aspirin was intended to be continued as well.  We continued her on DAPT with no significant issues. She had seen neurology who felt she was stable from a neurologic perspective post stroke.  No more routine follow-up with cardiac surgery. BP had been somewhat variable but no worrisome high blood pressures at the time. However, there was new LBBB on ECG. Echocardiogram was performed to evaluate with no worrisome findings. We attempted a PET MPI and this was truncated due to patient intolerance of regadenason stress agent. She had an episode of concerning hematemesis and at that point DAPT was discontinued (10/20/21), and she has been continued on monotherapy with plavix 75 mg daily.   Today, she presents overall feeling well. She is compliant with Amlodipine 2.5 mg, Atorvastatin 80 mg, Coreg 6.25 mg, Plavix 75 mg, and Valsartan 160 mg.   Her cholesterol levels were within  normal limits 2 years ago. Her hemoglobin was 8.7 at hosp dismissal in Oct '23. She will be getting blood work in February and will send it in then.   We discussed her EKG with occasional PVCs. Her blood pressure was 124/70 in clinic. LBBB has resolved. Intermittent.  She had gastric bleeding episodes about 5 months ago that has since resolved. She has been feeling very well.  She also recently broken her ankle. She did rehab and has been walking on her own without the assistance of her walker since early December.  She reports that she had a bad cold last month where she experienced a bit of shortness of breath.   She denies any palpitations, chest pain, shortness of breath, or peripheral edema. No lightheadedness, headaches, syncope, orthopnea, or PND.  Past Medical History:  Diagnosis Date   Coronary artery disease    CVA (cerebral vascular accident) (HCC)    Herpes zoster 2012   HERPES ZOSTER 08/13/2010   Qualifier: Diagnosis of  By: Orson Aloe MD, Jeffrey     Stroke [facility]) 09/25/2019   TIA (transient ischemic attack) 09/23/2019   Vertigo     Past Surgical History:  Procedure Laterality Date   BIOPSY  10/22/2021   Procedure: BIOPSY;  Surgeon: Kerin Salen, MD;  Location: [facility];  Service: Gastroenterology;;   CARDIAC CATHETERIZATION     CORONARY ARTERY BYPASS GRAFT N/A 11/29/2019   Procedure: CORONARY ARTERY BYPASS GRAFTING (CABG) x 6 ON PUMP. USING LEFT INTERNAL MAMMARY ARTERY, LEFT RADIAL ARTERY AND RIGHT GREATER SAPHENOUS VEIN.;  Surgeon: Valentina Lucks  Z, MD;  Location: [facility] OR;  Service: Open Heart Surgery;  Laterality: N/A;   ENDOVEIN HARVEST OF GREATER SAPHENOUS VEIN Right 11/29/2019   Procedure: ENDOVEIN HARVEST OF GREATER SAPHENOUS VEIN;  Surgeon: Linden Dolin, MD;  Location: [facility] OR;  Service: Open Heart Surgery;  Laterality: Right;   ESOPHAGOGASTRODUODENOSCOPY N/A 10/22/2021   Procedure: ESOPHAGOGASTRODUODENOSCOPY (EGD);  Surgeon: Kerin Salen, MD;  Location: [facility];   Service: Gastroenterology;  Laterality: N/A;   HEMOSTASIS CLIP PLACEMENT  10/22/2021   Procedure: HEMOSTASIS CLIP PLACEMENT;  Surgeon: Kerin Salen, MD;  Location: [facility];  Service: Gastroenterology;;   LEFT HEART CATH AND CORONARY ANGIOGRAPHY N/A 11/23/2019   Procedure: LEFT HEART CATH AND CORONARY ANGIOGRAPHY;  Surgeon: Corky Crafts, MD;  Location: [facility] INVASIVE CV LAB;  Service: Cardiovascular;  Laterality: N/A;   RADIAL ARTERY HARVEST Left 11/29/2019   Procedure: RADIAL ARTERY HARVEST;  Surgeon: Linden Dolin, MD;  Location: [facility] OR;  Service: Open Heart Surgery;  Laterality: Left;   RHINOPLASTY     TEE WITHOUT CARDIOVERSION N/A 11/29/2019   Procedure: TRANSESOPHAGEAL ECHOCARDIOGRAM (TEE);  Surgeon: Linden Dolin, MD;  Location: [facility] OR;  Service: Open Heart Surgery;  Laterality: N/A;    Current Medications: Current Meds  Medication Sig   amitriptyline (ELAVIL) 10 MG tablet TAKE 1 TABLET BY MOUTH EVERYDAY AT BEDTIME (Patient taking differently: Take 10 mg by mouth at bedtime.)   amLODipine (NORVASC) 2.5 MG tablet Take 2.5 mg by mouth daily.   atorvastatin (LIPITOR) 80 MG tablet TAKE 1 TABLET BY MOUTH EVERY DAY (Patient taking differently: Take 80 mg by mouth daily.)   carvedilol (COREG) 6.25 MG tablet Take 1 tablet (6.25 mg total) by mouth 2 (two) times daily.   clopidogrel (PLAVIX) 75 MG tablet Take 1 tablet (75 mg total) by mouth daily.   ezetimibe (ZETIA) 10 MG tablet Take 1 tablet (10 mg total) by mouth daily.   gabapentin (NEURONTIN) 300 MG capsule Take 900 mg by mouth at bedtime.   Multiple Vitamins-Minerals (PRESERVISION AREDS 2+MULTI VIT PO) Take 2 tablets by mouth daily.   pantoprazole (PROTONIX) 40 MG tablet Take 1 tablet (40 mg total) by mouth 2 (two) times daily before a meal.   sertraline (ZOLOFT) 50 MG tablet Take 100 mg by mouth daily.   triamcinolone (NASACORT ALLERGY 24HR) 55 MCG/ACT AERO nasal inhaler Place 2 sprays into the nose daily as needed (sinus  congestion).   valsartan (DIOVAN) 160 MG tablet Take 1 tablet (160 mg total) by mouth daily.  Reviewed in EMR, unable to pull in to chart.   Allergies:   Mango flavor and Contrast media [iodinated contrast media]   Social History   Tobacco Use   Smoking status: Never   Smokeless tobacco: Never  Vaping Use   Vaping status: Never Used  Substance Use Topics   Alcohol use: No   Drug use: No     Family History: The patient's family history includes CAD (age of onset: 16) in her father; Lung cancer (age of onset: 52) in her father; Unexplained death in her mother.  ROS:   Please see the history of present illness.     All other systems reviewed and are negative.  EKGs/Labs/Other Studies Reviewed:    The following studies were reviewed today:  EKG: EKG is personally reviewed. 02/13/2022: Sinus rhythm. Rate 84 bpm. PVCs. Nonspecific T wave abnormalities. 06/27/2021: NSR, LBBB.  Recent Labs: 10/25/2021: ALT 15; Magnesium 1.8 10/26/2021: BUN 11; Creatinine, Ser 0.66; Hemoglobin 8.7; Platelets  357; Potassium 3.7; Sodium 140  Recent Lipid Panel    Component Value Date/Time   CHOL 62 11/30/2019 0459   TRIG 68 11/30/2019 0459   HDL 20 (L) 11/30/2019 0459   CHOLHDL 3.1 11/30/2019 0459   VLDL 14 11/30/2019 0459   LDLCALC 28 11/30/2019 0459    Physical Exam:    VS:  BP 106/66   Pulse 81   Ht 5\' 4"  (1.626 m)   Wt 178 lb (80.7 kg)   SpO2 95%   BMI 30.55 kg/m     Wt Readings from Last 5 Encounters:  08/16/22 178 lb (80.7 kg)  02/13/22 177 lb 6.4 oz (80.5 kg)  10/23/21 194 lb 7.1 oz (88.2 kg)  06/27/21 191 lb (86.6 kg)  05/15/21 196 lb (88.9 kg)    Constitutional: No acute distress Eyes: sclera non-icteric, normal conjunctiva and lids ENMT: normal dentition, moist mucous membranes Cardiovascular:  regular rhythm, normal rate, no murmurs. S1 and S2 normal. No jugular venous distention.  Respiratory: clear to auscultation bilaterally GI : normal bowel sounds, soft and  nontender. No distention.   MSK: extremities warm, well perfused. No edema.  NEURO: grossly nonfocal exam, moves all extremities. PSYCH: alert and oriented x 3, normal mood and affect.    ASSESSMENT:    1. Coronary artery disease involving native coronary artery of native heart without angina pectoris    PLAN:    New LBBB -unclear etiology. Echo ok. PET MPI study truncated due to patient intolerance of regadenason stress agent. Offered alternate stress testing including SPECT MPI. She declines. LBBB resolved today, seems intermittent. Query progressing conduction system disease.   Coronary artery disease involving native coronary artery of native heart without angina pectoris Left ventricular apical aneurysm S/P CABG x 6  - no longer on DAPT due to bleeding, continue clopidogrel 75 mg daily. - continue carvedilol 6.25 mg twice daily and atorvastatin 80 mg daily.  Primary hypertension Medication management -continue amlodipine 2.5 mg daily, valsartan 160 mg daily, and carvedilol 6.25 mg BID.   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 Can continue plavix, does not need dapt for stroke.    Total time of encounter: 30 minutes total time of encounter, including 20 minutes spent in face-to-face patient care on the date of this encounter. This time includes coordination of care and counseling regarding above mentioned problem list. Remainder of non-face-to-face time involved reviewing chart documents/testing relevant to the patient encounter and documentation in the medical record. I have independently reviewed documentation from referring provider.   Weston Brass, MD, [facility] [place]  [facility]    Medication Adjustments/Labs and Tests Ordered: Current medicines are reviewed at length with the patient today.  Concerns regarding  medicines are outlined above.   Orders Placed This Encounter  Procedures   EKG 12-Lead   No orders of the defined types were placed in this encounter.  There are no Patient Instructions on file for this visit.

## 2022-08-23 IMAGING — CT CT ANGIO HEAD
2 of 11 series · 6 of 34 positions shown · IV contrast (Omnipaque)
Comparison: None.

CLINICAL DATA: Dizziness with history of vertigo.

EXAM:
CT ANGIOGRAPHY HEAD AND NECK
TECHNIQUE: Multidetector CT imaging of the head and neck was performed using
the standard protocol during bolus administration of intravenous
contrast. Multiplanar CT image reconstructions and MIPs were
obtained to evaluate the vascular anatomy. Carotid stenosis
measurements (when applicable) are obtained utilizing NASCET
criteria, using the distal internal carotid diameter as the
denominator.
CONTRAST:  75mL OMNIPAQUE IOHEXOL 350 MG/ML SOLN

[Series 7: sag soft · sagittal · 0.34mm/px · 1 of 49 slices shown]
[im 12/49  soft-tissue]
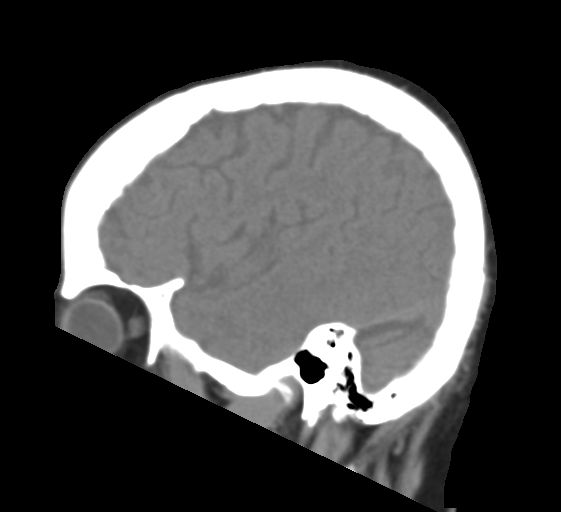

[Series 12: axial thin · axial · 0.44mm/px · z∈[-368,-159]mm · 5 of 336 slices shown]
[im 56/336  soft-tissue]
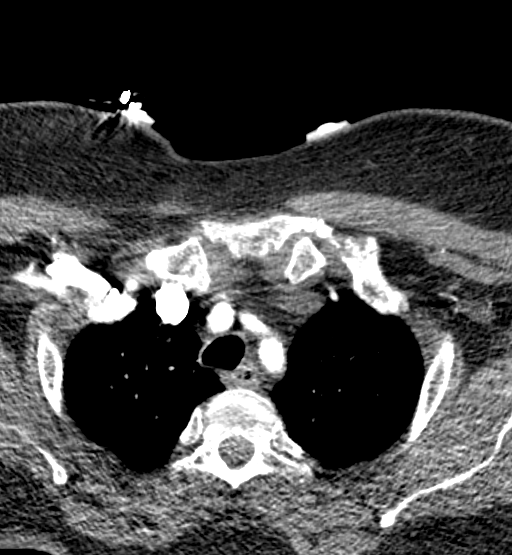
[im 112/336  bone]
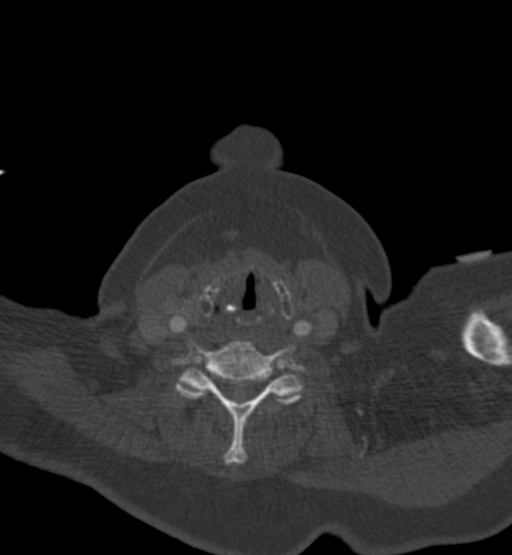
[im 168/336  soft-tissue]
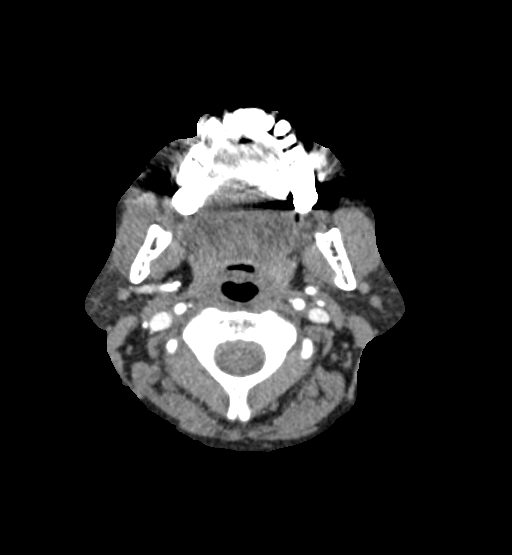
[im 224/336  bone]
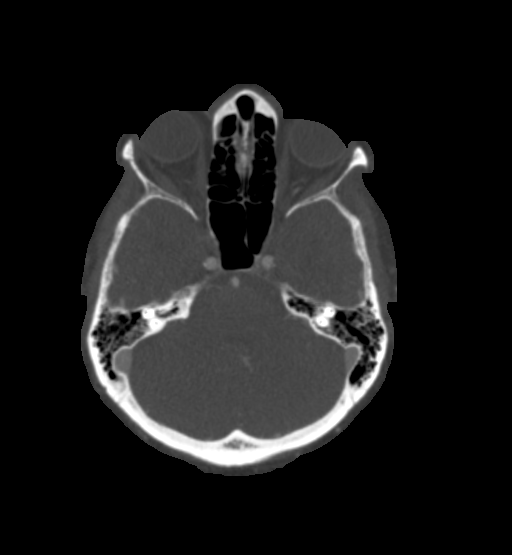
[im 280/336  soft-tissue]
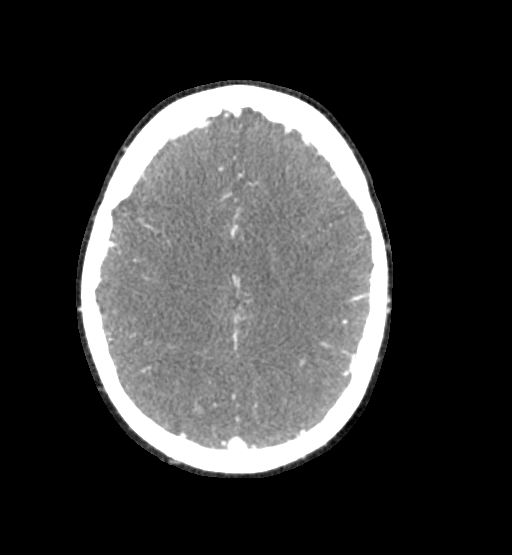

[6 of 34 positions shown; findings below may reference images not displayed]

FINDINGS: CT HEAD FINDINGS

Brain: There is no mass, hemorrhage or extra-axial collection. The
size and configuration of the ventricles and extra-axial CSF spaces
are normal. There is no acute or chronic infarction. The brain
parenchyma is normal.

Skull: The visualized skull base, calvarium and extracranial soft
tissues are normal.

Sinuses/Orbits: No fluid levels or advanced mucosal thickening of
the visualized paranasal sinuses. No mastoid or middle ear effusion.
The orbits are normal.

CTA NECK FINDINGS

SKELETON: There is no bony spinal canal stenosis. No lytic or
blastic lesion.

OTHER NECK: Normal pharynx, larynx and major salivary glands. No
cervical lymphadenopathy. Unremarkable thyroid gland.

UPPER CHEST: No pneumothorax or pleural effusion. No nodules or
masses.

AORTIC ARCH:

There is mild calcific atherosclerosis of the aortic arch. There is
no aneurysm, dissection or hemodynamically significant stenosis of
the visualized portion of the aorta. Conventional 3 vessel aortic
branching pattern. The visualized proximal subclavian arteries are
widely patent.

RIGHT CAROTID SYSTEM: No dissection, occlusion or aneurysm. Mild
atherosclerotic calcification at the carotid bifurcation without
hemodynamically significant stenosis.

LEFT CAROTID SYSTEM: Normal without aneurysm, dissection or
stenosis.

VERTEBRAL ARTERIES: Left dominant configuration. Both origins are
clearly patent. There is no dissection, occlusion or flow-limiting
stenosis to the skull base (V1-V3 segments).

CTA HEAD FINDINGS

POSTERIOR CIRCULATION:

--Vertebral arteries: Normal V4 segments.

--Inferior cerebellar arteries: Normal.

--Basilar artery: Normal.

--Superior cerebellar arteries: Normal.

--Posterior cerebral arteries (PCA): Normal.

ANTERIOR CIRCULATION:

--Intracranial internal carotid arteries: Severe stenosis of the
clinoid segment of the right ICA. Moderate stenosis of the left
clinoid segment.

--Anterior cerebral arteries (ACA): Normal. Both A1 segments are
present. Patent anterior communicating artery (a-comm).

--Middle cerebral arteries (MCA): Normal.

VENOUS SINUSES: As permitted by contrast timing, patent.

ANATOMIC VARIANTS: None

Review of the MIP images confirms the above findings.
IMPRESSION: 1. No emergent large vessel occlusion.
2. Severe stenosis of the clinoid segment of the right internal
carotid artery and moderate stenosis of the clinoid segment of the
left internal carotid artery.

Aortic Atherosclerosis ([id number]-W12.2).

## 2022-09-25 DIAGNOSIS — I10 Essential (primary) hypertension: Secondary | ICD-10-CM | POA: Diagnosis not present

## 2022-09-25 DIAGNOSIS — R7303 Prediabetes: Secondary | ICD-10-CM | POA: Diagnosis not present

## 2022-09-25 DIAGNOSIS — G43009 Migraine without aura, not intractable, without status migrainosus: Secondary | ICD-10-CM | POA: Diagnosis not present

## 2022-09-25 DIAGNOSIS — I4811 Longstanding persistent atrial fibrillation: Secondary | ICD-10-CM | POA: Diagnosis not present

## 2022-09-25 DIAGNOSIS — I2510 Atherosclerotic heart disease of native coronary artery without angina pectoris: Secondary | ICD-10-CM | POA: Diagnosis not present

## 2022-09-25 DIAGNOSIS — E782 Mixed hyperlipidemia: Secondary | ICD-10-CM | POA: Diagnosis not present

## 2022-09-25 LAB — HEMOGLOBIN A1C: A1c: 6.1

## 2022-09-25 LAB — CMP 10231: EGFR (Non-African Amer.): 78

## 2022-12-21 ENCOUNTER — Other Ambulatory Visit: Payer: Self-pay | Admitting: Internal Medicine

## 2022-12-25 ENCOUNTER — Encounter: Payer: Self-pay | Admitting: Pharmacist

## 2022-12-25 DIAGNOSIS — I63342 Cerebral infarction due to thrombosis of left cerebellar artery: Secondary | ICD-10-CM

## 2022-12-25 DIAGNOSIS — Z951 Presence of aortocoronary bypass graft: Secondary | ICD-10-CM

## 2022-12-25 MED ORDER — CLOPIDOGREL BISULFATE 75 MG PO TABS: 75.0000 mg | ORAL_TABLET | Freq: Every day | ORAL | 3 refills | Status: DC

## 2023-01-16 ENCOUNTER — Other Ambulatory Visit: Payer: Self-pay | Admitting: Internal Medicine

## 2023-01-16 DIAGNOSIS — E785 Hyperlipidemia, unspecified: Secondary | ICD-10-CM

## 2023-01-16 DIAGNOSIS — I2510 Atherosclerotic heart disease of native coronary artery without angina pectoris: Secondary | ICD-10-CM

## 2023-01-16 DIAGNOSIS — Z951 Presence of aortocoronary bypass graft: Secondary | ICD-10-CM

## 2023-01-18 ENCOUNTER — Encounter: Payer: Self-pay | Admitting: Pharmacist

## 2023-01-18 DIAGNOSIS — I2510 Atherosclerotic heart disease of native coronary artery without angina pectoris: Secondary | ICD-10-CM

## 2023-01-18 DIAGNOSIS — E785 Hyperlipidemia, unspecified: Secondary | ICD-10-CM

## 2023-01-18 DIAGNOSIS — Z951 Presence of aortocoronary bypass graft: Secondary | ICD-10-CM

## 2023-01-20 MED ORDER — CARVEDILOL 6.25 MG PO TABS: 6.2500 mg | ORAL_TABLET | Freq: Two times a day (BID) | ORAL | 11 refills | Status: DC

## 2023-01-20 MED ORDER — VALSARTAN 160 MG PO TABS: 160.0000 mg | ORAL_TABLET | Freq: Every day | ORAL | 1 refills | Status: DC

## 2023-01-20 MED ORDER — EZETIMIBE 10 MG PO TABS: 10.0000 mg | ORAL_TABLET | Freq: Every day | ORAL | 1 refills | Status: DC

## 2023-02-20 ENCOUNTER — Ambulatory Visit: Payer: Medicare Other | Admitting: Internal Medicine

## 2023-02-20 DIAGNOSIS — H43813 Vitreous degeneration, bilateral: Secondary | ICD-10-CM | POA: Diagnosis not present

## 2023-02-20 DIAGNOSIS — H353133 Nonexudative age-related macular degeneration, bilateral, advanced atrophic without subfoveal involvement: Secondary | ICD-10-CM | POA: Diagnosis not present

## 2023-02-28 ENCOUNTER — Encounter: Payer: Self-pay | Admitting: Internal Medicine

## 2023-02-28 ENCOUNTER — Ambulatory Visit: Payer: Medicare Other | Attending: Internal Medicine | Admitting: Internal Medicine

## 2023-02-28 VITALS — BP 118/78 | HR 87 | Ht 64.0 in | Wt 189.0 lb

## 2023-02-28 DIAGNOSIS — I10 Essential (primary) hypertension: Secondary | ICD-10-CM | POA: Diagnosis not present

## 2023-02-28 DIAGNOSIS — I4891 Unspecified atrial fibrillation: Secondary | ICD-10-CM | POA: Diagnosis not present

## 2023-02-28 NOTE — Patient Instructions (Signed)
 Medication Instructions:  Your physician recommends that you continue on your current medications as directed. Please refer to the Current Medication list given to you today.  *If you need a refill on your cardiac medications before your next appointment, please call your pharmacy*  Lab Work: None  Follow-Up: At [facility], you and your health needs are our priority.  As part of our continuing mission to provide you with exceptional heart care, we have created designated Provider Care Teams.  These Care Teams include your primary Cardiologist (physician) and Advanced Practice Providers (APPs -  Physician Assistants and Nurse Practitioners) who all work together to provide you with the care you need, when you need it.  Your next appointment:   6 month(s)  Provider:   Parke Poisson, MD

## 2023-02-28 NOTE — Progress Notes (Signed)
 Cardiology Office Note:    Date:  02/28/2023  ID:  AUBRIANA RAVELO, DOB February 20, 1940, MRN [id number]  PCP:  Lanis Thresa BROCKS, PA-C  Cardiologist:  Soyla DELENA Merck, MD  Electrophysiologist:  None   Referring MD: Lanis Thresa BROCKS, PA-C   Chief Complaint/Reason for Referral: CAD s/p CABG  History of Present Illness:    Jamie Hodge is a 83 y.o. 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 CCTA showed severe CAD, and after cath she was referred for CABG for multivessel CAD. She underwent CABG on 11/29/19.   Discussed the use of AI scribe software for clinical note transcription with the patient, who gave verbal consent to proceed.  History of Present Illness   The patient, with a history of coronary artery disease status post bypass surgery, intermittent left bundle branch block, and a history of stroke, presents for a routine follow-up. She reports feeling well overall, with no new or worsening symptoms. She denies any chest discomfort, shortness of breath, or recurrent stroke symptoms. She also reports no residual symptoms from her previous stroke. She has been maintaining her prescribed medication regimen, which includes Plavix , carvedilol , atorvastatin , Zetia , Imdur , valsartan , amlodipine , and gabapentin . The gabapentin  was started for post-herpetic neuralgia following a shingles outbreak and has been effective in managing her pain. She also reports a history of a broken ankle, which has led to some hesitation and instability, particularly when stepping up or down off a curb. She has been managing this with home physical therapy exercises.      Prior:  At a prior visit she reported she did not tolerate carvedilol  12.5 mg BID, dose reduced to 6.25 mg BID. Stopped aspirin  due to nosebleeds per neurology notes. Nosebleeding subsided with use of  teabag. She went back on aspirin  and continued to take Plavix  for dual antiplatelet therapy.  Per Dr. German prior note, she was to continue Plavix  indefinitely, though somewhat unclear if aspirin  was intended to be continued as well.  We continued her on DAPT with no significant issues. She had seen neurology who felt she was stable from a neurologic perspective post stroke.  No more routine follow-up with cardiac surgery. BP had been somewhat variable but no worrisome high blood pressures at the time. However, there was new LBBB on ECG. Echocardiogram was performed to evaluate with no worrisome findings. We attempted a PET MPI and this was truncated due to patient intolerance of regadenason stress agent. She had an episode of concerning hematemesis and at that point DAPT was discontinued (10/20/21), and she has been continued on monotherapy with plavix  75 mg daily.   Today, she presents overall feeling well. She is compliant with Amlodipine  2.5 mg, Atorvastatin  80 mg, Coreg  6.25 mg, Plavix  75 mg, and Valsartan  160 mg.   Her cholesterol levels were within normal limits 2 years ago. Her hemoglobin was 8.7 at hosp dismissal in Oct '23. She will be getting blood work in February and will send it in then.   We discussed her EKG with occasional PVCs. Her blood pressure was 124/70 in clinic. LBBB has resolved. Intermittent.  She had gastric bleeding episodes about 5 months ago that has since resolved. She has been feeling very well.  She also recently broken her ankle. She did rehab and has been walking on her own without the assistance of her walker since early December.  She reports that she  had a bad cold last month where she experienced a bit of shortness of breath.   She denies any palpitations, chest pain, shortness of breath, or peripheral edema. No lightheadedness, headaches, syncope, orthopnea, or PND.  Past Medical History:  Diagnosis Date   Coronary artery disease    CVA (cerebral vascular  accident) (HCC)    Herpes zoster 2012   HERPES ZOSTER 08/13/2010   Qualifier: Diagnosis of  By: Charlott MD, Jeffrey     Stroke [facility]) 09/25/2019   TIA (transient ischemic attack) 09/23/2019   Vertigo     Past Surgical History:  Procedure Laterality Date   BIOPSY  10/22/2021   Procedure: BIOPSY;  Surgeon: Saintclair Jasper, MD;  Location: [facility];  Service: Gastroenterology;;   CARDIAC CATHETERIZATION     CORONARY ARTERY BYPASS GRAFT N/A 11/29/2019   Procedure: CORONARY ARTERY BYPASS GRAFTING (CABG) x 6 ON PUMP. USING LEFT INTERNAL MAMMARY ARTERY, LEFT RADIAL ARTERY AND RIGHT GREATER SAPHENOUS VEIN.;  Surgeon: German Bartlett PEDLAR, MD;  Location: [facility] OR;  Service: Open Heart Surgery;  Laterality: N/A;   ENDOVEIN HARVEST OF GREATER SAPHENOUS VEIN Right 11/29/2019   Procedure: ENDOVEIN HARVEST OF GREATER SAPHENOUS VEIN;  Surgeon: German Bartlett PEDLAR, MD;  Location: [facility] OR;  Service: Open Heart Surgery;  Laterality: Right;   ESOPHAGOGASTRODUODENOSCOPY N/A 10/22/2021   Procedure: ESOPHAGOGASTRODUODENOSCOPY (EGD);  Surgeon: Saintclair Jasper, MD;  Location: [facility];  Service: Gastroenterology;  Laterality: N/A;   HEMOSTASIS CLIP PLACEMENT  10/22/2021   Procedure: HEMOSTASIS CLIP PLACEMENT;  Surgeon: Saintclair Jasper, MD;  Location: [facility];  Service: Gastroenterology;;   LEFT HEART CATH AND CORONARY ANGIOGRAPHY N/A 11/23/2019   Procedure: LEFT HEART CATH AND CORONARY ANGIOGRAPHY;  Surgeon: Dann Candyce RAMAN, MD;  Location: [facility] INVASIVE CV LAB;  Service: Cardiovascular;  Laterality: N/A;   RADIAL ARTERY HARVEST Left 11/29/2019   Procedure: RADIAL ARTERY HARVEST;  Surgeon: German Bartlett PEDLAR, MD;  Location: [facility] OR;  Service: Open Heart Surgery;  Laterality: Left;   RHINOPLASTY     TEE WITHOUT CARDIOVERSION N/A 11/29/2019   Procedure: TRANSESOPHAGEAL ECHOCARDIOGRAM (TEE);  Surgeon: German Bartlett PEDLAR, MD;  Location: [facility] OR;  Service: Open Heart Surgery;  Laterality: N/A;    Current Medications: Current Meds   Medication Sig   amitriptyline  (ELAVIL ) 10 MG tablet TAKE 1 TABLET BY MOUTH EVERYDAY AT BEDTIME (Patient taking differently: Take 10 mg by mouth at bedtime.)   amLODipine  (NORVASC ) 2.5 MG tablet Take 2.5 mg by mouth daily.   atorvastatin  (LIPITOR ) 80 MG tablet TAKE 1 TABLET BY MOUTH EVERY DAY (Patient taking differently: Take 80 mg by mouth daily.)   carvedilol  (COREG ) 6.25 MG tablet Take 1 tablet (6.25 mg total) by mouth 2 (two) times daily.   clopidogrel  (PLAVIX ) 75 MG tablet Take 1 tablet (75 mg total) by mouth daily.   ezetimibe  (ZETIA ) 10 MG tablet Take 1 tablet (10 mg total) by mouth daily.   gabapentin  (NEURONTIN ) 300 MG capsule Take 900 mg by mouth at bedtime.   Multiple Vitamins-Minerals (PRESERVISION AREDS 2+MULTI VIT PO) Take 2 tablets by mouth daily.   pantoprazole  (PROTONIX ) 40 MG tablet Take 1 tablet (40 mg total) by mouth 2 (two) times daily before a meal.   sertraline  (ZOLOFT ) 50 MG tablet Take 100 mg by mouth daily.   triamcinolone  (NASACORT  ALLERGY 24HR) 55 MCG/ACT AERO nasal inhaler Place 2 sprays into the nose daily as needed (sinus congestion).   valsartan  (DIOVAN ) 160 MG tablet Take 1 tablet (160 mg total) by mouth daily.  Reviewed in EMR, unable to pull in to chart.   Allergies:   Mango flavoring agent (non-screening) and Contrast media [iodinated contrast media]   Social History   Tobacco Use   Smoking status: Never   Smokeless tobacco: Never  Vaping Use   Vaping status: Never Used  Substance Use Topics   Alcohol use: No   Drug use: No     Family History: The patient's family history includes CAD (age of onset: 11) in her father; Lung cancer (age of onset: 49) in her father; Unexplained death in her mother.  ROS:   Please see the history of present illness.     All other systems reviewed and are negative.  EKGs/Labs/Other Studies Reviewed:    The following studies were reviewed today:  EKG: EKG Interpretation Date/Time:  Friday February 28 2023  13:27:11 EST Ventricular Rate:  83 PR Interval:  192 QRS Duration:  136 QT Interval:  412 QTC Calculation: 484 R Axis:   -30  Text Interpretation: Normal sinus rhythm Left axis deviation Left bundle branch block When compared with ECG of 16-Aug-2022 16:06, Left bundle branch block is now Present Minimal criteria for Anterior infarct are no longer Present Confirmed by Felicie Kocher ([id number]) on 02/28/2023 1:41:42 PM   02/13/2022: Sinus rhythm. Rate 84 bpm. PVCs. Nonspecific T wave abnormalities. 06/27/2021: NSR, LBBB.  Recent Labs: No results found for requested labs within last 365 days.  Recent Lipid Panel    Component Value Date/Time   CHOL 62 11/30/2019 0459   TRIG 68 11/30/2019 0459   HDL 20 (L) 11/30/2019 0459   CHOLHDL 3.1 11/30/2019 0459   VLDL 14 11/30/2019 0459   LDLCALC 28 11/30/2019 0459    Physical Exam:    VS:  BP 118/78   Pulse 87   Ht 5' 4 (1.626 m)   Wt 189 lb (85.7 kg)   SpO2 94%   BMI 32.44 kg/m     Wt Readings from Last 5 Encounters:  02/28/23 189 lb (85.7 kg)  08/16/22 178 lb (80.7 kg)  02/13/22 177 lb 6.4 oz (80.5 kg)  10/23/21 194 lb 7.1 oz (88.2 kg)  06/27/21 191 lb (86.6 kg)    Constitutional: No acute distress Eyes: sclera non-icteric, normal conjunctiva and lids ENMT: normal dentition, moist mucous membranes Cardiovascular:  regular rhythm, normal rate, no murmurs. S1 and S2 normal. No jugular venous distention.  Respiratory: clear to auscultation bilaterally GI : normal bowel sounds, soft and nontender. No distention.   MSK: extremities warm, well perfused. No edema.  NEURO: grossly nonfocal exam, moves all extremities. PSYCH: alert and oriented x 3, normal mood and affect.    ASSESSMENT:    1. Primary hypertension   2. Atrial fibrillation, unspecified type (HCC)    PLAN:    New LBBB, intermittent -unclear etiology. Echo ok. PET MPI study truncated due to patient intolerance of regadenason stress agent. Consider alternate stress  testing including SPECT MPI. She declines. LBBB recurrent today, seems intermittent. Query progressing conduction system disease. Cautioned on red flag sx.  Coronary artery disease involving native coronary artery of native heart without angina pectoris Left ventricular apical aneurysm S/P CABG x 6  - no longer on DAPT due to bleeding, continue clopidogrel  75 mg daily. - continue carvedilol  6.25 mg twice daily and atorvastatin  80 mg daily.  Primary hypertension Medication management -continue amlodipine  2.5 mg daily, valsartan  160 mg daily, and carvedilol  6.25 mg BID, isosorbide  30 mg daily.   Hyperlipidemia, unspecified hyperlipidemia type -  continue lipitor  80 mg daily, and she has been started on Zetia  10 mg daily.    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 Can continue plavix , does not need dapt for stroke.    Assessment and Plan    Intermittent Left Bundle Branch Block - present on ekg today No symptoms of lightheadedness, dizziness, or fainting. Discussed the potential need for a pacemaker if symptoms develop or if EKG shows concerning changes. -Continue current medications:Plavix  daily, Carvedilol  6.25mg  BID, Atorvastatin  80mg  daily, Zetia  10mg  daily, Imdur  30mg  daily, Valsartan  160mg  daily, Amlodipine  2.5mg  daily. -Continue monitoring with EKG every 6 months.  Hyperlipidemia LDL slightly above target range (87, target 55-70) and triglycerides slightly elevated (159, target <150). Discussed potential for dietary improvements and increased physical activity. -Encourage dietary changes and increased physical activity. -Recheck lipid panel at next primary care visit in March.  Post-stroke management No residual or recurrent stroke symptoms. -Continue current management.  Ankle instability post-fracture Patient reports hesitation and lack of confidence in ankle a year after fracture. No formal physical therapy  was done. -Encourage continued home physical therapy. -Consider consultation with orthopedic doctor for further management if needed.  General Health Maintenance / Followup Plans -Return in 6 months for routine follow-up. -Consider repeating labs prior to next visit.        Soyla Merck, MD, [facility]  [facility]    Medication Adjustments/Labs and Tests Ordered: Current medicines are reviewed at length with the patient today.  Concerns regarding medicines are outlined above.   Orders Placed This Encounter  Procedures   EKG 12-Lead   No orders of the defined types were placed in this encounter.  Patient Instructions  Medication Instructions:  Your physician recommends that you continue on your current medications as directed. Please refer to the Current Medication list given to you today.  *If you need a refill on your cardiac medications before your next appointment, please call your pharmacy*  Lab Work: None  Follow-Up: At [facility], you and your health needs are our priority.  As part of our continuing mission to provide you with exceptional heart care, we have created designated Provider Care Teams.  These Care Teams include your primary Cardiologist (physician) and Advanced Practice Providers (APPs -  Physician Assistants and Nurse Practitioners) who all work together to provide you with the care you need, when you need it.   Your next appointment:   6 month(s)  Provider:   Nycholas Rayner A Yaron Grasse, MD

## 2023-03-24 DIAGNOSIS — E782 Mixed hyperlipidemia: Secondary | ICD-10-CM | POA: Diagnosis not present

## 2023-03-24 DIAGNOSIS — I4811 Longstanding persistent atrial fibrillation: Secondary | ICD-10-CM | POA: Diagnosis not present

## 2023-03-24 DIAGNOSIS — I2510 Atherosclerotic heart disease of native coronary artery without angina pectoris: Secondary | ICD-10-CM | POA: Diagnosis not present

## 2023-03-24 DIAGNOSIS — Z0000 Encounter for general adult medical examination without abnormal findings: Secondary | ICD-10-CM | POA: Diagnosis not present

## 2023-03-24 DIAGNOSIS — G43009 Migraine without aura, not intractable, without status migrainosus: Secondary | ICD-10-CM | POA: Diagnosis not present

## 2023-03-24 DIAGNOSIS — R7303 Prediabetes: Secondary | ICD-10-CM | POA: Diagnosis not present

## 2023-03-24 DIAGNOSIS — B0229 Other postherpetic nervous system involvement: Secondary | ICD-10-CM | POA: Diagnosis not present

## 2023-03-24 DIAGNOSIS — I10 Essential (primary) hypertension: Secondary | ICD-10-CM | POA: Diagnosis not present

## 2023-05-22 DIAGNOSIS — H353133 Nonexudative age-related macular degeneration, bilateral, advanced atrophic without subfoveal involvement: Secondary | ICD-10-CM | POA: Diagnosis not present

## 2023-05-22 DIAGNOSIS — H43813 Vitreous degeneration, bilateral: Secondary | ICD-10-CM | POA: Diagnosis not present

## 2023-07-18 ENCOUNTER — Other Ambulatory Visit: Payer: Self-pay | Admitting: Internal Medicine

## 2023-07-18 DIAGNOSIS — I2510 Atherosclerotic heart disease of native coronary artery without angina pectoris: Secondary | ICD-10-CM

## 2023-07-18 DIAGNOSIS — E785 Hyperlipidemia, unspecified: Secondary | ICD-10-CM

## 2023-07-18 DIAGNOSIS — Z951 Presence of aortocoronary bypass graft: Secondary | ICD-10-CM

## 2023-07-22 ENCOUNTER — Encounter: Payer: Self-pay | Admitting: Pharmacist

## 2023-07-22 DIAGNOSIS — I2510 Atherosclerotic heart disease of native coronary artery without angina pectoris: Secondary | ICD-10-CM

## 2023-07-22 DIAGNOSIS — E785 Hyperlipidemia, unspecified: Secondary | ICD-10-CM

## 2023-07-22 DIAGNOSIS — Z951 Presence of aortocoronary bypass graft: Secondary | ICD-10-CM

## 2023-07-23 MED ORDER — EZETIMIBE 10 MG PO TABS
10.0000 mg | ORAL_TABLET | Freq: Every day | ORAL | 1 refills | Status: AC
Start: 2023-07-23 — End: ?

## 2023-08-03 MED ORDER — VALSARTAN 160 MG PO TABS: 160.0000 mg | ORAL_TABLET | Freq: Every day | ORAL | 1 refills | Status: DC

## 2023-08-03 NOTE — Addendum Note (Signed)
 Addended by: DARRELL BRUCKNER on: 08/03/2023 05:19 PM   Modules accepted: Orders

## 2023-08-26 DIAGNOSIS — E782 Mixed hyperlipidemia: Secondary | ICD-10-CM | POA: Diagnosis not present

## 2023-08-26 DIAGNOSIS — R7303 Prediabetes: Secondary | ICD-10-CM | POA: Diagnosis not present

## 2023-08-26 DIAGNOSIS — I10 Essential (primary) hypertension: Secondary | ICD-10-CM | POA: Diagnosis not present

## 2023-08-27 DIAGNOSIS — H353133 Nonexudative age-related macular degeneration, bilateral, advanced atrophic without subfoveal involvement: Secondary | ICD-10-CM | POA: Diagnosis not present

## 2023-08-27 DIAGNOSIS — H35031 Hypertensive retinopathy, right eye: Secondary | ICD-10-CM | POA: Diagnosis not present

## 2023-08-27 DIAGNOSIS — Z961 Presence of intraocular lens: Secondary | ICD-10-CM | POA: Diagnosis not present

## 2023-08-27 DIAGNOSIS — H43813 Vitreous degeneration, bilateral: Secondary | ICD-10-CM | POA: Diagnosis not present

## 2023-09-18 DIAGNOSIS — H353123 Nonexudative age-related macular degeneration, left eye, advanced atrophic without subfoveal involvement: Secondary | ICD-10-CM | POA: Diagnosis not present

## 2023-10-21 DIAGNOSIS — E782 Mixed hyperlipidemia: Secondary | ICD-10-CM | POA: Diagnosis not present

## 2023-10-21 DIAGNOSIS — R7303 Prediabetes: Secondary | ICD-10-CM | POA: Diagnosis not present

## 2023-10-21 DIAGNOSIS — I10 Essential (primary) hypertension: Secondary | ICD-10-CM | POA: Diagnosis not present

## 2023-10-21 DIAGNOSIS — I4811 Longstanding persistent atrial fibrillation: Secondary | ICD-10-CM | POA: Diagnosis not present

## 2023-10-21 LAB — LAB REPORT - SCANNED: A1c: 6.4

## 2023-10-22 LAB — LAB REPORT - SCANNED: EGFR: 81

## 2023-10-23 ENCOUNTER — Ambulatory Visit: Attending: Internal Medicine | Admitting: Internal Medicine

## 2023-10-23 VITALS — BP 142/66 | HR 74 | Ht 63.5 in | Wt 193.2 lb

## 2023-10-23 DIAGNOSIS — Z951 Presence of aortocoronary bypass graft: Secondary | ICD-10-CM

## 2023-10-23 DIAGNOSIS — I4891 Unspecified atrial fibrillation: Secondary | ICD-10-CM

## 2023-10-23 DIAGNOSIS — I253 Aneurysm of heart: Secondary | ICD-10-CM

## 2023-10-23 DIAGNOSIS — I2510 Atherosclerotic heart disease of native coronary artery without angina pectoris: Secondary | ICD-10-CM

## 2023-10-23 DIAGNOSIS — Z8679 Personal history of other diseases of the circulatory system: Secondary | ICD-10-CM

## 2023-10-23 DIAGNOSIS — I10 Essential (primary) hypertension: Secondary | ICD-10-CM | POA: Diagnosis not present

## 2023-10-23 DIAGNOSIS — E785 Hyperlipidemia, unspecified: Secondary | ICD-10-CM

## 2023-10-23 NOTE — Progress Notes (Addendum)
 Cardiology Office Note:  .   Date:  10/23/2023  ID:  Rollene DELENA Agreste, DOB 20-Oct-1940, MRN [id number] PCP: Lanis Thresa JAYSON DEVONNA  [facility] Providers Cardiologist:  Soyla DELENA Merck, MD    History of Present Illness: SABRA   JEYLIN WOODMANSEE is a 83 y.o. female.  Discussed the use of AI scribe software for clinical note transcription with the patient, who gave verbal consent to proceed.  History of Present Illness JAXIE RACANELLI is an 83 year old female with left bundle branch block and coronary artery disease who presents for cardiovascular follow-up. 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 CCTA showed severe CAD, and after cath she was referred for CABG for multivessel CAD. She underwent CABG on 11/29/19.   She has no new cardiovascular issues or concerns since her last visit. Her history includes intermittent left bundle branch block, previously observed on EKGs. She is on carvedilol , atorvastatin , and Plavix , and has stopped aspirin  due to bleeding on DAPT, with no further bleeding issues reported.  Her recent cholesterol labs show an LDL of 97, HDL of 41, and triglycerides of 191. She is on atorvastatin  and ezetimibe  for cholesterol management. Her blood pressure was slightly elevated today, attributed to nervousness. She is on amlodipine , valsartan , carvedilol , and isosorbide  for blood pressure management.    ROS: negative except per HPI above.  Studies Reviewed: SABRA   EKG Interpretation Date/Time:  Thursday October 23 2023 14:58:59 EDT Ventricular Rate:  76 PR Interval:  178 QRS Duration:  68 QT Interval:  366 QTC Calculation: 411 R Axis:   27  Text Interpretation: Normal sinus rhythm Nonspecific T wave abnormality When compared with ECG of 28-Feb-2023 13:27, Left bundle branch block is no longer Present Confirmed by Merck Soyla ([id number]) on  10/23/2023 3:19:17 PM    Results LABS LDL: 97 (10/21/2023) HDL: 41 (10/21/2023) Triglycerides: 191 (10/21/2023)  DIAGNOSTIC ECG: Normal (10/23/2023) Risk Assessment/Calculations:       Physical Exam:   VS:  BP (!) 142/66   Pulse 74   Ht 5' 3.5 (1.613 m)   Wt 193 lb 3.2 oz (87.6 kg)   SpO2 98%   BMI 33.69 kg/m    Wt Readings from Last 3 Encounters:  10/23/23 193 lb 3.2 oz (87.6 kg)  02/28/23 189 lb (85.7 kg)  08/16/22 178 lb (80.7 kg)     Physical Exam GENERAL: Alert, cooperative, well developed, no acute distress HEENT: Normocephalic, normal oropharynx, moist mucous membranes CHEST: Clear to auscultation bilaterally, no wheezes, rhonchi, or crackles CARDIOVASCULAR: Normal heart rate and rhythm, S1 and S2 normal without murmurs ABDOMEN: Soft, non-tender, non-distended, without organomegaly, normal bowel sounds EXTREMITIES: No cyanosis or edema NEUROLOGICAL: Cranial nerves grossly intact, moves all extremities without gross motor or sensory deficit   ASSESSMENT AND PLAN: .    Assessment and Plan Assessment & Plan Atherosclerotic heart disease of native coronary artery, status post coronary artery bypass grafting LV apical aneurysm Coronary artery disease with previous seven-vessel coronary artery bypass grafting. No current ischemic symptoms. - continue plavix  in monotherapy due to prior bleeding, 75 mg daily.  - Continue carvedilol  6.25 mg twice daily. - Continue isosorbide  30 mg daily. - Continue atorvastatin  80 mg daily. - Continue ezetimibe  10 mg daily.  Intermittent left bundle branch block Intermittent left bundle branch block not present on current EKG. No associated cardiovascular symptoms. Previous adverse reaction to stress test noted. -  Monitor for symptoms. If symptoms arise, consider monitor and cardiac catheterization instead of stress test (tolerated stress poorly).  Hyperlipidemia in the setting of atherosclerotic cardiovascular disease LDL at 97  mg/dL, HDL at 41 mg/dL, triglycerides at 808 mg/dL. Possible dietary influence. Currently on atorvastatin  and ezetimibe . Discussed potential for injectable cholesterol medications if levels do not improve with dietary changes. She is hesitant about injections but open to reconsidering if levels remain elevated. - Continue atorvastatin  80 mg daily. - Continue ezetimibe  10 mg daily. - Attempt dietary modifications to improve lipid profile. - Re-evaluate lipid levels in six months. - Consider PCKS9I cholesterol medications if lipid levels remain elevated.  Hypertension Blood pressure slightly elevated today, possibly due to anxiety. Previous readings normal. - Continue amlodipine  2.5 mg daily. - Continue valsartan  160 mg daily. - Continue carvedilol  6.25 mg twice daily.  Recording duration: 19 minutes      Jasleen Riepe, MD, FACC

## 2023-10-23 NOTE — Patient Instructions (Signed)
 Medication Instructions:   Your physician recommends that you continue on your current medications as directed. Please refer to the Current Medication list given to you today.   *If you need a refill on your cardiac medications before your next appointment, please call your pharmacy*   Lab Work: MAKE SURE YOU WILL GET  YOUR FASTING  LIPIDS   BEFORE 6 MONTH FOLLOW UP    If you have labs (blood work) drawn today and your tests are completely normal, you will receive your results only by: MyChart Message (if you have MyChart) OR A paper copy in the mail If you have any lab test that is abnormal or we need to change your treatment, we will call you to review the results.    Testing/Procedures: NONE ORDERED  TODAY     Follow-Up: At [facility], you and your health needs are our priority.  As part of our continuing mission to provide you with exceptional heart care, our providers are all part of one team.  This team includes your primary Cardiologist (physician) and Advanced Practice Providers or APPs (Physician Assistants and Nurse Practitioners) who all work together to provide you with the care you need, when you need it.  Your next appointment:   6 month(s)  Provider:   Gayatri A Acharya, MD    We recommend signing up for the patient portal called MyChart.  Sign up information is provided on this After Visit Summary.  MyChart is used to connect with patients for Virtual Visits (Telemedicine).  Patients are able to view lab/test results, encounter notes, upcoming appointments, etc.  Non-urgent messages can be sent to your provider as well.   To learn more about what you can do with MyChart, go to [email or web address].   Other Instructions

## 2023-10-30 ENCOUNTER — Other Ambulatory Visit (HOSPITAL_COMMUNITY): Payer: Self-pay

## 2023-10-30 ENCOUNTER — Telehealth: Payer: Self-pay | Admitting: Pharmacist Clinician (PhC)/ Clinical Pharmacy Specialist

## 2023-10-30 ENCOUNTER — Telehealth: Payer: Self-pay | Admitting: Pharmacy Technician

## 2023-10-30 DIAGNOSIS — H26491 Other secondary cataract, right eye: Secondary | ICD-10-CM | POA: Diagnosis not present

## 2023-10-30 DIAGNOSIS — Z961 Presence of intraocular lens: Secondary | ICD-10-CM | POA: Diagnosis not present

## 2023-10-30 DIAGNOSIS — H35033 Hypertensive retinopathy, bilateral: Secondary | ICD-10-CM | POA: Diagnosis not present

## 2023-10-30 DIAGNOSIS — H353123 Nonexudative age-related macular degeneration, left eye, advanced atrophic without subfoveal involvement: Secondary | ICD-10-CM | POA: Diagnosis not present

## 2023-10-30 DIAGNOSIS — Z1389 Encounter for screening for other disorder: Secondary | ICD-10-CM

## 2023-10-30 DIAGNOSIS — H43813 Vitreous degeneration, bilateral: Secondary | ICD-10-CM | POA: Diagnosis not present

## 2023-10-30 NOTE — Telephone Encounter (Signed)
  Patient Advocate Encounter   The patient was approved for a [employer] grant that will help cover the cost of nexlizet Total amount awarded, 2500.  Effective: 09/30/23 - 09/28/24   APW:[id number] ERW:[id number] Hmnle:[id number] PI:[id number] [employer] ID: [id number]   Pharmacy provided with approval and processing information. Patient informed via mychart

## 2023-10-30 NOTE — Telephone Encounter (Signed)
 Pharmacy Patient Advocate Encounter  Received notification from [employer] that Prior Authorization for nexlizet has been APPROVED from 10/30/23 to 04/29/24. Ran test claim, Copay is $248.64. This test claim was processed through [facility]- copay amounts may vary at other pharmacies due to pharmacy/plan contracts, or as the patient moves through the different stages of their insurance plan.   PA #/Case ID/Reference #: [id number]    Patient Advocate Encounter   The patient was approved for a [employer] grant that will help cover the cost of nexlizet Total amount awarded, 2500.  Effective: 09/30/23 - 09/28/24   APW:[id number] ERW:[id number] Hmnle:[id number] PI:[id number] [employer] ID: [id number]   Pharmacy provided with approval and processing information. Patient informed via mychart

## 2023-10-30 NOTE — Telephone Encounter (Signed)
   Pharmacy Patient Advocate Encounter   Received notification from Pt Calls Messages that prior authorization for nexlizet is required/requested.   Insurance verification completed.   The patient is insured through [place].   Per test claim: PA required; PA submitted to above mentioned insurance via Latent Key/confirmation #/EOC [id number] Status is pending

## 2023-10-30 NOTE — Telephone Encounter (Signed)
 Spoke with patient, she is agreeable to trying Nexlizet.  Will submit PA request to her insurance.    Once approved will MyChart message and determine if she will need a [employer] grant.    She will stop ezetimibe  to take the combination tablet

## 2023-10-30 NOTE — Telephone Encounter (Signed)
-----   Message from Soyla DELENA Merck sent at 10/23/2023  5:08 PM EDT ----- If you want to reach out to her that would be amazing. Thank you! ----- Message ----- From: Herschel Allean CROME, RPH-CPP Sent: 10/23/2023   4:00 PM EDT To: Soyla DELENA Merck, MD  If she prefers to stay with oral, then bempedoic acid is our best bet.  It could be the numbers increased if she wasn't fasting, but regardless, we'd like the LDL < 70.    With her plan, Nexlizet (we could combine with ezetimibe  and not change tablet burden) would cost $47 per month after $255 deductible.  If this is cost prohibitive (because she will have to pay the $255 again on Jan 1), we can see if she would be eligible for a grant to cover her costs.    I can reach out to her next week if you would like to try this Allean ----- Message ----- From: Merck Soyla DELENA, MD Sent: 10/23/2023   3:27 PM EDT To: Cv Div Pharmd  PT debating between uptitration of oral HLD therapy vs psck9i which she would not prefer if possible.  Would adding bempedoic acid help at this point? On atorva 80 and zetia  10 mg, LDL increased to 97 without reason, trig up to 191.  What would you recommend next orally? Thanks, GA

## 2023-11-07 MED ORDER — NEXLIZET 180-10 MG PO TABS: 1.0000 | ORAL_TABLET | Freq: Every day | ORAL | 3 refills | Status: AC

## 2023-11-07 NOTE — Addendum Note (Signed)
 Addended by: Ailie Gage L on: 11/07/2023 07:35 AM   Modules accepted: Orders

## 2023-12-04 LAB — URIC ACID: Uric Acid: 5 mg/dL (ref 3.1–7.9)

## 2023-12-05 ENCOUNTER — Ambulatory Visit: Payer: Self-pay | Admitting: Pharmacist Clinician (PhC)/ Clinical Pharmacy Specialist

## 2023-12-15 ENCOUNTER — Other Ambulatory Visit: Payer: Self-pay | Admitting: Internal Medicine

## 2023-12-15 DIAGNOSIS — I63342 Cerebral infarction due to thrombosis of left cerebellar artery: Secondary | ICD-10-CM

## 2023-12-15 DIAGNOSIS — Z951 Presence of aortocoronary bypass graft: Secondary | ICD-10-CM

## 2023-12-19 ENCOUNTER — Encounter: Payer: Self-pay | Admitting: Internal Medicine

## 2023-12-19 DIAGNOSIS — Z951 Presence of aortocoronary bypass graft: Secondary | ICD-10-CM

## 2023-12-19 DIAGNOSIS — I63342 Cerebral infarction due to thrombosis of left cerebellar artery: Secondary | ICD-10-CM

## 2023-12-22 MED ORDER — CLOPIDOGREL BISULFATE 75 MG PO TABS: 75.0000 mg | ORAL_TABLET | Freq: Every day | ORAL | 2 refills | Status: AC

## 2024-01-10 ENCOUNTER — Encounter: Payer: Self-pay | Admitting: Internal Medicine

## 2024-01-16 ENCOUNTER — Other Ambulatory Visit: Payer: Self-pay | Admitting: Internal Medicine

## 2024-01-16 DIAGNOSIS — Z951 Presence of aortocoronary bypass graft: Secondary | ICD-10-CM

## 2024-01-16 DIAGNOSIS — I2510 Atherosclerotic heart disease of native coronary artery without angina pectoris: Secondary | ICD-10-CM

## 2024-01-17 ENCOUNTER — Encounter: Payer: Self-pay | Admitting: Internal Medicine

## 2024-01-19 ENCOUNTER — Other Ambulatory Visit: Payer: Self-pay

## 2024-01-19 DIAGNOSIS — I2510 Atherosclerotic heart disease of native coronary artery without angina pectoris: Secondary | ICD-10-CM

## 2024-01-19 MED ORDER — CARVEDILOL 6.25 MG PO TABS: 6.2500 mg | ORAL_TABLET | Freq: Two times a day (BID) | ORAL | 3 refills | Status: AC

## 2024-01-19 NOTE — Telephone Encounter (Signed)
 Refill sent to pharmacy.

## 2024-01-20 ENCOUNTER — Other Ambulatory Visit: Payer: Self-pay

## 2024-01-20 ENCOUNTER — Encounter: Payer: Self-pay | Admitting: Internal Medicine

## 2024-01-20 DIAGNOSIS — I2510 Atherosclerotic heart disease of native coronary artery without angina pectoris: Secondary | ICD-10-CM

## 2024-01-20 DIAGNOSIS — Z951 Presence of aortocoronary bypass graft: Secondary | ICD-10-CM

## 2024-01-20 MED ORDER — VALSARTAN 160 MG PO TABS: 160.0000 mg | ORAL_TABLET | Freq: Every day | ORAL | 1 refills | Status: AC

## 2024-04-28 ENCOUNTER — Ambulatory Visit: Admitting: Internal Medicine
